# Patient Record
Sex: Male | Born: 1935 | Race: Black or African American | Hispanic: No | Marital: Married | Smoking: Former smoker
Health system: Southern US, Community
[De-identification: ages and names within clinical notes are randomized; demographics above are authoritative.]

## PROBLEM LIST (undated history)

## (undated) DIAGNOSIS — F0280 Dementia in other diseases classified elsewhere without behavioral disturbance: Secondary | ICD-10-CM

## (undated) DIAGNOSIS — E785 Hyperlipidemia, unspecified: Secondary | ICD-10-CM

## (undated) DIAGNOSIS — N400 Enlarged prostate without lower urinary tract symptoms: Secondary | ICD-10-CM

## (undated) DIAGNOSIS — F0390 Unspecified dementia without behavioral disturbance: Secondary | ICD-10-CM

## (undated) DIAGNOSIS — G309 Alzheimer's disease, unspecified: Secondary | ICD-10-CM

## (undated) DIAGNOSIS — I10 Essential (primary) hypertension: Secondary | ICD-10-CM

---

## 1999-09-27 ENCOUNTER — Ambulatory Visit (HOSPITAL_COMMUNITY): Admission: RE | Admit: 1999-09-27 | Discharge: 1999-09-27 | Payer: Self-pay | Admitting: Gastroenterology

## 1999-09-27 ENCOUNTER — Encounter (INDEPENDENT_AMBULATORY_CARE_PROVIDER_SITE_OTHER): Payer: Self-pay | Admitting: Specialist

## 2002-09-16 ENCOUNTER — Encounter (INDEPENDENT_AMBULATORY_CARE_PROVIDER_SITE_OTHER): Payer: Self-pay | Admitting: Specialist

## 2002-09-16 ENCOUNTER — Ambulatory Visit (HOSPITAL_COMMUNITY): Admission: RE | Admit: 2002-09-16 | Discharge: 2002-09-16 | Payer: Self-pay | Admitting: Gastroenterology

## 2008-02-21 ENCOUNTER — Inpatient Hospital Stay (HOSPITAL_COMMUNITY): Admission: EM | Admit: 2008-02-21 | Discharge: 2008-02-26 | Payer: Self-pay | Admitting: Emergency Medicine

## 2008-02-24 ENCOUNTER — Ambulatory Visit: Payer: Self-pay | Admitting: Physical Medicine & Rehabilitation
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
## 2010-12-04 NOTE — Consult Note (Signed)
NAMERAFEAL, SKIBICKI         ACCOUNT NO.:  [id number]   MEDICAL RECORD NO.:  [id number]          PATIENT TYPE:  INP   LOCATION:  5031                         FACILITY:  [facility]   PHYSICIAN:  Corinna L. Lendell Caprice, MDDATE OF BIRTH:  July 29, 1931   DATE OF CONSULTATION:  02/23/2008  DATE OF DISCHARGE:                                 CONSULTATION   REQUESTING PHYSICIAN:  Kerrin Champagne, M.D.   REASON FOR CONSULTATION:  Confusion.   IMPRESSIONS AND RECOMMENDATIONS:  1. Acute delirium, resolved, suspect medication/anesthesia-related:  I      agree with stopping Darvocet.  I will also recommend discontinuing      morphine.  He complains of urinary frequency and has a mild      leukocytosis.  He is afebrile, but I will check an urinalysis to      rule out urinary tract infection as a potential cause of delirium.      Certainly the patient is lucid, cooperative and calm at this time.  2. Hypertension, controlled.  3. Status post fall with resulting right nondisplaced femoral neck      fracture, status post pinning.   HISTORY OF PRESENT ILLNESS:  Mr. Perlmutter is a pleasant 77 year old  black male patient of Dr. Donette Larry who was admitted to Dr. Barbaraann Faster service  with a femoral neck fracture.  This was repaired on August 2.  The  patient apparently had problems with confusion.  Postoperatively he  thought he was at home.  He pulled out his IV and became agitated and  verbally abusive.  Security had to be called.  We were consulted to  assist with his confusion.  Currently the patient is without complaints.  He does not recall the events of yesterday and the day before.  He does  remember tripping as he got out of his car.   PAST MEDICAL HISTORY:  Hypertension.   MEDICATIONS:  Morphine as needed, Colace 100 mg p.o. b.i.d., nifedipine  XL 30 mg a day, warfarin per pharmacy for DVT prophylaxis, morphine 1 mg  q. 1 hour p.r.n.  Darvocet and Ativan were just stopped, as was Robaxin.  He was  started on tramadol as needed.   SOCIAL HISTORY:  The patient reports occasional alcohol use.  He denies  daily use or excessive use.  He quit smoking several years ago.  He is  married.   FAMILY HISTORY:  Noncontributory.   REVIEW OF SYSTEMS:  As above, otherwise negative.   PHYSICAL EXAMINATION:  His temperature is 98.1, pulse is 107,  respiratory rate 18, blood pressure 133, oxygen saturation 96% on room  air.  GENERAL:  The patient is an elderly black male in no acute distress  watching TV.  HEENT:  Normocephalic, atraumatic.  Pupils equal, round, reactive to  light.  Sclerae nonicteric.  Moist mucous membranes.  NECK:  Supple.  No carotid bruits.  No JVD.  No thyromegaly.  LUNGS:  Clear to auscultation bilaterally without wheezes, rhonchi or  rales.  CARDIOVASCULAR:  Regular rate and rhythm, without murmurs, gallops or  rubs.  ABDOMEN:  Normal bowel sounds, soft, nontender, nondistended.  GU AND  RECTAL:  Deferred.  EXTREMITIES:  No clubbing, cyanosis or edema.  He has a clean, dry and  intact dressing over his right hip.  SKIN:  No rash.  PSYCHIATRIC:  The patient is calm and cooperative with a normal affect.  NEUROLOGIC:  The patient is alert and oriented x3.  Cranial nerves and  sensorimotor exam are intact.   LABS:  CBC significant for a white blood cell count of 12,000, otherwise  unremarkable.  INR today on Coumadin is 1.5.  Complete metabolic panel  significant for a total bilirubin of 1.5, AST of 67, otherwise  unremarkable.  EKG shows normal sinus rhythm with left atrial  enlargement.  Right hip film on admission showed acute right femoral  neck fracture with osteopenia.  Chest x-ray shows slightly low lung  volumes, mild tortuosity.  No infiltrate.   [facility] to follow up the urinalysis and for further  delirium.  Thank you, Dr. Otelia Sergeant for this consultation.      Corinna L. Lendell Caprice, MD  Electronically Signed     CLS/MEDQ  D:   02/23/2008  T:  02/23/2008  Job:  [id number]   cc:   Kerrin Champagne, M.D.  [name], MD

## 2010-12-07 NOTE — Op Note (Signed)
   NAMEKENNEY, GOING                     ACCOUNT NO.:  [id number]   MEDICAL RECORD NO.:  [id number]                   PATIENT TYPE:  AMB   LOCATION:  ENDO                                 FACILITY:  [facility]   PHYSICIAN:  Danise Edge, M.D.                DATE OF BIRTH:  06/05/1932   DATE OF PROCEDURE:  09/16/2002  DATE OF DISCHARGE:                                 OPERATIVE REPORT   PROCEDURE:  Colonoscopy and polypectomy.   INDICATIONS:  The patient is a 77 year old male born 2032-01-25.  Three years  ago the patient underwent a colonoscopy and neoplastic polyps were removed  from his colon.  He is due for a repeat surveillance colonoscopy with  polypectomy to prevent colon cancer.   ENDOSCOPIST:  Danise Edge, M.D.   PREMEDICATION:  Versed 10 mg, Demerol 50 mg.   ENDOSCOPE:  Olympus pediatric colonoscope.   DESCRIPTION OF PROCEDURE:  After obtaining informed consent, the patient was  placed in the left lateral decubitus position.  I administered intravenous  Demerol and intravenous Versed to achieve conscious sedation for the  procedure.  The patient's blood pressure, oxygen saturation, and cardiac  rhythm were monitored throughout the procedure and documented in the medical  record.   Anal inspection was normal.  Digital rectal exam was normal.  The Olympus  pediatric video colonoscope was introduced into the rectum and advanced to  the cecum.  Colonic preparation for the exam today was excellent.   The patient does have universal colonic diverticulosis without signs of  diverticulitis or diverticular stricture formation.   Rectum normal.   Sigmoid colon and descending colon normal.   Splenic flexure normal.   Transverse colon normal.   Hepatic flexure:  A 3 mm sessile polyp was removed with the electrocautery  snare.  A 1 mm sessile polyp was removed with the hot biopsy forceps.   Ascending colon normal.   Cecum and ileocecal valve normal.    ASSESSMENT:  1. Universal colonic diverticulosis.  2. From the hepatic flexure a 3 mm polyp and a 1 mm polyp were removed.   RECOMMENDATIONS:  Repeat colonoscopy in five years.                                               Danise Edge, M.D.    MJ/MEDQ  D:  09/16/2002  T:  09/16/2002  Job:  [id number]   cc:   [name], M.D.  [address]  [place]  [place] [address]  Fax: [phone]
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
## 2011-04-19 LAB — HEMOGLOBIN AND HEMATOCRIT, BLOOD
HCT: 43.3
Hemoglobin: 14.1

## 2011-04-19 LAB — PROTIME-INR
INR: 0.9
INR: 1
INR: 1.5
Prothrombin Time: 18.5 — ABNORMAL HIGH
Prothrombin Time: 19.8 — ABNORMAL HIGH
Prothrombin Time: 20.2 — ABNORMAL HIGH

## 2011-04-19 LAB — URINALYSIS, ROUTINE W REFLEX MICROSCOPIC
Hgb urine dipstick: NEGATIVE
Leukocytes, UA: NEGATIVE
Nitrite: NEGATIVE
Protein, ur: 30 — AB
Specific Gravity, Urine: 1.026
Urobilinogen, UA: 1

## 2011-04-19 LAB — COMPREHENSIVE METABOLIC PANEL
ALT: 31
Calcium: 9.3
Creatinine, Ser: 0.91
GFR calc Af Amer: >60
GFR calc non Af Amer: >60
Glucose, Bld: 119 — ABNORMAL HIGH
Sodium: 137
Total Protein: 6.5

## 2011-04-19 LAB — CBC
Hemoglobin: 15.1
Hemoglobin: 15.3
MCHC: 32.1
MCV: 90.6
RBC: 5.25
RDW: 15.1
WBC: 8.3

## 2011-04-19 LAB — DIFFERENTIAL
Lymphocytes Relative: 11 — ABNORMAL LOW
Monocytes Absolute: 0.2
Monocytes Relative: 2 — ABNORMAL LOW
Neutro Abs: 7.1

## 2011-04-19 LAB — URINE MICROSCOPIC-ADD ON

## 2011-04-19 LAB — BASIC METABOLIC PANEL
CO2: 23
Calcium: 9.5
GFR calc Af Amer: >60
GFR calc non Af Amer: >60
Sodium: 137

## 2011-12-18 ENCOUNTER — Other Ambulatory Visit: Payer: Self-pay | Admitting: Internal Medicine

## 2011-12-18 ENCOUNTER — Ambulatory Visit
Admission: RE | Admit: 2011-12-18 | Discharge: 2011-12-18 | Disposition: A | Discharge status: Home / Self Care | Payer: Medicare Other | Source: Ambulatory Visit | Attending: Internal Medicine | Admitting: Internal Medicine

## 2011-12-18 DIAGNOSIS — M79669 Pain in unspecified lower leg: Secondary | ICD-10-CM

## 2014-11-21 ENCOUNTER — Inpatient Hospital Stay (HOSPITAL_COMMUNITY): Payer: Medicare Other

## 2014-11-21 ENCOUNTER — Encounter (HOSPITAL_COMMUNITY): Payer: Self-pay | Admitting: Emergency Medicine

## 2014-11-21 ENCOUNTER — Inpatient Hospital Stay (HOSPITAL_COMMUNITY)
Admission: EM | Admit: 2014-11-21 | Discharge: 2014-11-25 | DRG: 377 | Disposition: A | Discharge status: Home / Self Care | Payer: Medicare Other | Attending: Family Medicine | Admitting: Family Medicine

## 2014-11-21 DIAGNOSIS — K5791 Diverticulosis of intestine, part unspecified, without perforation or abscess with bleeding: Principal | ICD-10-CM | POA: Diagnosis present

## 2014-11-21 DIAGNOSIS — Z87891 Personal history of nicotine dependence: Secondary | ICD-10-CM

## 2014-11-21 DIAGNOSIS — E43 Unspecified severe protein-calorie malnutrition: Secondary | ICD-10-CM | POA: Diagnosis not present

## 2014-11-21 DIAGNOSIS — D72829 Elevated white blood cell count, unspecified: Secondary | ICD-10-CM | POA: Diagnosis present

## 2014-11-21 DIAGNOSIS — G309 Alzheimer's disease, unspecified: Secondary | ICD-10-CM | POA: Diagnosis present

## 2014-11-21 DIAGNOSIS — F0280 Dementia in other diseases classified elsewhere without behavioral disturbance: Secondary | ICD-10-CM | POA: Diagnosis present

## 2014-11-21 DIAGNOSIS — K625 Hemorrhage of anus and rectum: Secondary | ICD-10-CM

## 2014-11-21 DIAGNOSIS — K922 Gastrointestinal hemorrhage, unspecified: Secondary | ICD-10-CM | POA: Diagnosis not present

## 2014-11-21 DIAGNOSIS — Z681 Body mass index (BMI) 19 or less, adult: Secondary | ICD-10-CM

## 2014-11-21 DIAGNOSIS — N400 Enlarged prostate without lower urinary tract symptoms: Secondary | ICD-10-CM

## 2014-11-21 DIAGNOSIS — I10 Essential (primary) hypertension: Secondary | ICD-10-CM | POA: Diagnosis present

## 2014-11-21 DIAGNOSIS — K5790 Diverticulosis of intestine, part unspecified, without perforation or abscess without bleeding: Secondary | ICD-10-CM | POA: Diagnosis present

## 2014-11-21 DIAGNOSIS — E785 Hyperlipidemia, unspecified: Secondary | ICD-10-CM | POA: Diagnosis present

## 2014-11-21 DIAGNOSIS — F0391 Unspecified dementia with behavioral disturbance: Secondary | ICD-10-CM | POA: Diagnosis not present

## 2014-11-21 DIAGNOSIS — D62 Acute posthemorrhagic anemia: Secondary | ICD-10-CM

## 2014-11-21 DIAGNOSIS — F0390 Unspecified dementia without behavioral disturbance: Secondary | ICD-10-CM | POA: Diagnosis not present

## 2014-11-21 DIAGNOSIS — R197 Diarrhea, unspecified: Secondary | ICD-10-CM | POA: Diagnosis not present

## 2014-11-21 DIAGNOSIS — K5731 Diverticulosis of large intestine without perforation or abscess with bleeding: Secondary | ICD-10-CM | POA: Diagnosis not present

## 2014-11-21 DIAGNOSIS — D649 Anemia, unspecified: Secondary | ICD-10-CM

## 2014-11-21 HISTORY — DX: Dementia in other diseases classified elsewhere, unspecified severity, without behavioral disturbance, psychotic disturbance, mood disturbance, and anxiety: F02.80

## 2014-11-21 HISTORY — DX: Benign prostatic hyperplasia without lower urinary tract symptoms: N40.0

## 2014-11-21 HISTORY — DX: Unspecified dementia without behavioral disturbance: F03.90

## 2014-11-21 HISTORY — DX: Alzheimer's disease, unspecified: G30.9

## 2014-11-21 HISTORY — DX: Essential (primary) hypertension: I10

## 2014-11-21 HISTORY — DX: Unspecified dementia, unspecified severity, without behavioral disturbance, psychotic disturbance, mood disturbance, and anxiety: F03.90

## 2014-11-21 HISTORY — DX: Hyperlipidemia, unspecified: E78.5

## 2014-11-21 LAB — CBC
HCT: 40.4 % (ref 39.0–52.0)
HCT: 38.1 % — ABNORMAL LOW (ref 39.0–52.0)
HEMOGLOBIN: 12.8 g/dL — AB (ref 13.0–17.0)
Hemoglobin: 12.4 g/dL — ABNORMAL LOW (ref 13.0–17.0)
MCH: 28.1 pg (ref 26.0–34.0)
MCH: 28.6 pg (ref 26.0–34.0)
MCHC: 31.7 g/dL (ref 30.0–36.0)
MCHC: 32.5 g/dL (ref 30.0–36.0)
MCV: 88.8 fL (ref 78.0–100.0)
MCV: 88 fL (ref 78.0–100.0)
PLATELETS: 485 10*3/uL — AB (ref 150–400)
PLATELETS: 462 10*3/uL — AB (ref 150–400)
RBC: 4.55 MIL/uL (ref 4.22–5.81)
RBC: 4.33 MIL/uL (ref 4.22–5.81)
RDW: 14.2 % (ref 11.5–15.5)
RDW: 14.1 % (ref 11.5–15.5)
WBC: 11.4 10*3/uL — ABNORMAL HIGH (ref 4.0–10.5)
WBC: 8.9 10*3/uL (ref 4.0–10.5)

## 2014-11-21 LAB — URINALYSIS, ROUTINE W REFLEX MICROSCOPIC
Bilirubin Urine: NEGATIVE
GLUCOSE, UA: NEGATIVE mg/dL
Hgb urine dipstick: NEGATIVE
KETONES UR: 40 mg/dL — AB
Leukocytes, UA: NEGATIVE
NITRITE: NEGATIVE
Protein, ur: NEGATIVE mg/dL
Specific Gravity, Urine: 1.015 (ref 1.005–1.030)
UROBILINOGEN UA: 0.2 mg/dL (ref 0.0–1.0)
pH: 6 (ref 5.0–8.0)

## 2014-11-21 LAB — COMPREHENSIVE METABOLIC PANEL
ALBUMIN: 3.4 g/dL — AB (ref 3.5–5.0)
ALK PHOS: 59 U/L (ref 38–126)
ALT: 11 U/L — AB (ref 17–63)
ANION GAP: 6 (ref 5–15)
AST: 19 U/L (ref 15–41)
BUN: 9 mg/dL (ref 6–20)
CALCIUM: 8.7 mg/dL — AB (ref 8.9–10.3)
CO2: 26 mmol/L (ref 22–32)
Chloride: 107 mmol/L (ref 101–111)
Creatinine, Ser: 0.97 mg/dL (ref 0.61–1.24)
GFR calc Af Amer: >60 mL/min (ref >60)
GFR calc non Af Amer: >60 mL/min (ref >60)
GLUCOSE: 89 mg/dL (ref 70–99)
Potassium: 3.8 mmol/L (ref 3.5–5.1)
Sodium: 139 mmol/L (ref 135–145)
TOTAL PROTEIN: 7.6 g/dL (ref 6.5–8.1)
Total Bilirubin: 0.7 mg/dL (ref 0.3–1.2)

## 2014-11-21 LAB — POC OCCULT BLOOD, ED: Fecal Occult Bld: POSITIVE — AB

## 2014-11-21 LAB — MAGNESIUM: Magnesium: 2.1 mg/dL (ref 1.7–2.4)

## 2014-11-21 LAB — LIPASE, BLOOD: Lipase: 22 U/L (ref 22–51)

## 2014-11-21 MED ORDER — ONDANSETRON HCL 4 MG PO TABS: 4.0000 mg | ORAL_TABLET | Freq: Four times a day (QID) | ORAL | Status: DC | PRN

## 2014-11-21 MED ORDER — ONDANSETRON HCL 4 MG/2ML IJ SOLN: 4.0000 mg | Freq: Four times a day (QID) | INTRAMUSCULAR | Status: DC | PRN

## 2014-11-21 MED ORDER — DONEPEZIL HCL 5 MG PO TABS
5.0000 mg | ORAL_TABLET | Freq: Every day | ORAL | Status: DC
  Administered 2014-11-21 – 2014-11-24 (×3): 5 mg via ORAL
  Filled 2014-11-21 (×3): qty 1

## 2014-11-21 MED ORDER — MAGNESIUM CITRATE PO SOLN: 1.0000 | Freq: Once | ORAL | Status: AC | PRN

## 2014-11-21 MED ORDER — ACETAMINOPHEN 650 MG RE SUPP: 650.0000 mg | Freq: Four times a day (QID) | RECTAL | Status: DC | PRN

## 2014-11-21 MED ORDER — SODIUM CHLORIDE 0.9 % IV SOLN
INTRAVENOUS | Status: DC
  Administered 2014-11-21 – 2014-11-23 (×2): via INTRAVENOUS

## 2014-11-21 MED ORDER — PANTOPRAZOLE SODIUM 40 MG IV SOLR
40.0000 mg | Freq: Two times a day (BID) | INTRAVENOUS | Status: DC
  Administered 2014-11-21 – 2014-11-24 (×5): 40 mg via INTRAVENOUS
  Filled 2014-11-21 (×7): qty 40

## 2014-11-21 MED ORDER — FINASTERIDE 5 MG PO TABS
5.0000 mg | ORAL_TABLET | Freq: Every day | ORAL | Status: DC
Start: 2014-11-22 — End: 2014-11-25
  Administered 2014-11-22 – 2014-11-25 (×4): 5 mg via ORAL
  Filled 2014-11-21 (×4): qty 1

## 2014-11-21 MED ORDER — SODIUM CHLORIDE 0.9 % IV BOLUS (SEPSIS): 1000.0000 mL | Freq: Once | INTRAVENOUS | Status: DC

## 2014-11-21 MED ORDER — SORBITOL 70 % SOLN: 30.0000 mL | Freq: Every day | Status: DC | PRN

## 2014-11-21 MED ORDER — SODIUM CHLORIDE 0.9 % IV SOLN
INTRAVENOUS | Status: AC
  Administered 2014-11-21: 16:00:00 via INTRAVENOUS

## 2014-11-21 MED ORDER — SENNOSIDES-DOCUSATE SODIUM 8.6-50 MG PO TABS: 1.0000 | ORAL_TABLET | Freq: Every evening | ORAL | Status: DC | PRN

## 2014-11-21 MED ORDER — MIRTAZAPINE 15 MG PO TABS
7.5000 mg | ORAL_TABLET | Freq: Every day | ORAL | Status: DC
  Administered 2014-11-21 – 2014-11-24 (×3): 7.5 mg via ORAL
  Filled 2014-11-21 (×3): qty 1

## 2014-11-21 MED ORDER — ACETAMINOPHEN 325 MG PO TABS
650.0000 mg | ORAL_TABLET | Freq: Four times a day (QID) | ORAL | Status: DC | PRN
  Administered 2014-11-24: 650 mg via ORAL
  Filled 2014-11-21: qty 2

## 2014-11-21 MED ORDER — ALUM & MAG HYDROXIDE-SIMETH 200-200-20 MG/5ML PO SUSP: 30.0000 mL | Freq: Four times a day (QID) | ORAL | Status: DC | PRN

## 2014-11-21 NOTE — H&P (Signed)
[facility] History and Physical  Billy [name] Oncale FAO:[id number] DOB: Nov 10, 1931 DOA: 11/21/2014  Referring physician: Rosetta Posnert Steinl PCP: Georgann HousekeeperHUSAIN,KARRAR, MD   Chief Complaint: Rectal bleeding  HPI: Billy [name] Narciso is a 81 y.o. male  With history of hypertension, hyperlipidemia, BPH, dementia, pandiverticulosis who presented to the ED with a 2 to three-day history of worsening bright red blood per rectum. History is obtained from caretaker and patient. Caretaker and Patient does state that has had some loose stools which have been black in color and subsequently turned bright red with significant worsening bleeding on the day of admission. Patient denies any fever, no chills, no nausea, no dysuria, no hematemesis, no shortness of breath, no chest pain. Patient does endorse some dizziness and generalized weakness as well as emesis that he describes as dark colored. Patient does endorse bilateral lower abdominal pain of been ongoing for the past 1-2 days. Patient denies any insect use states he uses Tylenol as needed. Patient denies any history of peptic ulcer disease. Patient denies any history of hemorrhoids. Patient was seen in the emergency room labs obtained had a comprehensive metabolic profile that was unremarkable. CBC with a white count of 11.4 hemoglobin of 12.8 platelet count of 485 otherwise was unremarkable. Urinalysis was nitrite negative and leukocyte negative. FOBT was positive. [facility] were called to admit the patient for further evaluation and management. EDP was also to contact GI for further evaluation and consultation.   Review of Systems: As per history of present illness otherwise negative. Constitutional:  No weight loss, night sweats, Fevers, chills, fatigue.  HEENT:  No headaches, Difficulty swallowing,Tooth/dental problems,Sore throat,  No sneezing, itching, ear ache, nasal congestion, post nasal drip,  Cardio-vascular:  No chest pain, Orthopnea, PND,  swelling in lower extremities, anasarca, dizziness, palpitations  GI:  No heartburn, indigestion, abdominal pain, nausea, vomiting, diarrhea, change in bowel habits, loss of appetite  Resp:  No shortness of breath with exertion or at rest. No excess mucus, no productive cough, No non-productive cough, No coughing up of blood.No change in color of mucus.No wheezing.No chest wall deformity  Skin:  no rash or lesions.  GU:  no dysuria, change in color of urine, no urgency or frequency. No flank pain.  Musculoskeletal:  No joint pain or swelling. No decreased range of motion. No back pain.  Psych:  No change in mood or affect. No depression or anxiety. No memory loss.   Past Medical History  Diagnosis Date  . Hypertension   . Hyperlipidemia   . Alzheimer's disease   . HTN (hypertension) 11/21/2014  . Dementia 11/21/2014  . BPH (benign prostatic hyperplasia) 11/21/2014   History reviewed. No pertinent past surgical history. Social History:  reports that he has quit smoking. He does not have any smokeless tobacco history on file. He reports that he does not drink alcohol or use illicit drugs.  No Known Allergies  History reviewed. No pertinent family history. Patient states both mother and father deceased patient cannot remember what the deceased from.  Prior to Admission medications   Medication Sig Start Date End Date Taking? Authorizing Provider  donepezil (ARICEPT) 5 MG tablet Take 1 tablet by mouth at bedtime. 10/25/14  Yes Historical Provider, MD  finasteride (PROSCAR) 5 MG tablet Take 1 tablet by mouth daily. 10/25/14  Yes Historical Provider, MD  mirtazapine (REMERON) 7.5 MG tablet Take 1 tablet by mouth at bedtime. 11/02/14  Yes Historical Provider, MD  NIFEdipine (PROCARDIA-XL/ADALAT-CC/NIFEDICAL-XL) 30 MG 24 hr tablet Take 30 mg by  mouth daily. 10/25/14  Yes Historical Provider, MD   Physical Exam: Filed Vitals:   11/21/14 1252 11/21/14 1550  BP: 147/84 136/61  Pulse: 90 105  Temp:  97.9 F (36.6 C)   TempSrc: Oral   Resp: 18 18  Height:  (1.727 m)   Weight: 68.04 kg (150 lb)   SpO2: 98% 100%    Wt Readings from Last 3 Encounters:  11/21/14 68.04 kg (150 lb)    General:  Elderly thin gentleman in no acute cardiopulmonary distress. Speaking in full sentences. Appears calm and comfortable Eyes: PERRLA, EOMI, normal lids, irises & conjunctiva ENT: grossly normal hearing, lips & tongue Neck: no LAD, masses or thyromegaly Cardiovascular: Tachycardia, no m/r/g. No LE edema. Respiratory: CTA bilaterally, no w/r/r. Normal respiratory effort. Abdomen: soft, nondistended, positive bowel sounds, some tenderness to palpation in bilateral lower quadrants. Skin: no rash or induration seen on limited exam Musculoskeletal: grossly normal tone BUE/BLE Psychiatric: grossly normal mood and affect, speech fluent and appropriate Neurologic: Alert and oriented 3. Cranial nerves II through XII are grossly intact. No focal deficits.           Labs on Admission:  Basic Metabolic Panel:  Recent Labs Lab 11/21/14 1419  NA 139  K 3.8  CL 107  CO2 26  GLUCOSE 89  BUN 9  CREATININE 0.97  CALCIUM 8.7*   Liver Function Tests:  Recent Labs Lab 11/21/14 1419  AST 19  ALT 11*  ALKPHOS 59  BILITOT 0.7  PROT 7.6  ALBUMIN 3.4*    Recent Labs Lab 11/21/14 1419  LIPASE 22   No results for input(s): AMMONIA in the last 168 hours. CBC:  Recent Labs Lab 11/21/14 1419  WBC 11.4*  HGB 12.8*  HCT 40.4  MCV 88.8  PLT 485*   Cardiac Enzymes: No results for input(s): CKTOTAL, CKMB, CKMBINDEX, TROPONINI in the last 168 hours.  BNP (last 3 results) No results for input(s): BNP in the last 8760 hours.  ProBNP (last 3 results) No results for input(s): PROBNP in the last 8760 hours.  CBG: No results for input(s): GLUCAP in the last 168 hours.  Radiological Exams on Admission: No results found.  EKG: Not done  Assessment/Plan Principal Problem:   GI  bleed Active Problems:   GI bleeding   Acute blood loss anemia   Diverticulosis   Leukocytosis   HTN (hypertension)   Dementia   BPH (benign prostatic hyperplasia)  #1 GI bleed Patient is presenting with a 2 to three-day history of worsening bleeding that he describes mostly as a bright red blood per rectum. Patient and caretaker however states that prior to his bright red blood bleeding patient was noted to have loose stools that were described as black and concerning for melena. Patient denies any NSAID use. Patient however does have a history of diverticulosis. Patient's BUN is within normal limits. Will admit the patient to telemetry. Place patient on clear liquids. Place patient on a PPI IV every 12 hours. Will check serial CBCs. Follow H&H. IV fluids. Supportive care. GI will be consulted on the patient. Follow for now.  #2 acute blood loss anemia Secondary to problem #1. Check serial CBCs. Transfusion threshold hemoglobin less than 8.  #3 history of diverticulosis Follow for now.  #4 leukocytosis Likely reactive leukocytosis. Urinalysis is negative. Will check a chest x-ray. Follow.  #5 dementia Continue Aricept.  #6 hypertension WIll hold blood pressure medications for now.  Follow.  #7 BPH Continue home regimen  of Proscar.  #8 prophylaxis PPI for GI prophylaxis. SCDs for DVT prophylaxis.  Code Status: Full DVT Prophylaxis: SCDs Family Communication: Updated patient, caretaker, niece at bedside. Disposition Plan: Admit to telemetry.  Time spent: 70 mins  [facility] MD [facility] Pager [phone]

## 2014-11-21 NOTE — ED Notes (Signed)
Per EMS-patient presents from home and is c/o blood (bright red blood) noted in stool and with stool, diarrhea x3 days, denies emesis but endorses nausea. Caregiver reports some dementia and says, "he wasn't acting himself" but could not articulate why or how. A&Ox4 for EMS.

## 2014-11-21 NOTE — ED Notes (Signed)
Billy HarmanDana charge , pt can go at 15;05.Jeralene [email or web address]

## 2014-11-21 NOTE — ED Notes (Signed)
I attempted to collect labs on patient and wad unsuccessful.  I made nurse awrae.

## 2014-11-21 NOTE — ED Notes (Signed)
Unable to obtain blood for testing

## 2014-11-21 NOTE — ED Notes (Signed)
MD hospitalist at bedside.

## 2014-11-21 NOTE — ED Notes (Addendum)
POS Blood Occult reported to RN [employer] Melton and Dr Denton LankSteinl

## 2014-11-21 NOTE — Consult Note (Signed)
[facility] CONSULT Reason for consult: G.I. bleeding Referring Physician: ER. PCP: Dr. Lysle Rubens. Primary G.I.: Billy [name] is an 81 y.o. male.  HPI: he has dementia and lives at home. He is widowed and has an active niece and has had help in the home taking care. He is seen Dr. Wynetta Emery for many years with multiple prior colonoscopies. Colonoscopy in 2004 and again in 2010 revealed pan diverticulosis. Nation had adenomatous polyp removed in 2010 another polyp removed in 2004 but I'm currently unable to find a path on that polyp. According to his caretaker and to his niece he's been eating well, having no abdominal pain, and is not been constipated. He had some loose stools and cramps yesterday in a small amount of blood with a larger amount of blood today. He denied any abdominal pain today. He's had no nausea vomiting. He is on Depokote and nifedipine and home, but is not apparently on NSAIDs or PPI therapy. He also takes Remeron. He had a small amount of readily stool in the emergency room heme positive. Hemoglobin is 12.8  Past Medical History  Diagnosis Date  . Hypertension   . Hyperlipidemia     History reviewed. No pertinent past surgical history.  History reviewed. No pertinent family history.  Social History:  reports that he has quit smoking. He does not have any smokeless tobacco history on file. He reports that he does not drink alcohol. His drug history is not on file.  Allergies: No Known Allergies  Medications; Prior to Admission medications   Medication Sig Start Date End Date Taking? Authorizing Provider  donepezil (ARICEPT) 5 MG tablet Take 1 tablet by mouth at bedtime. 10/25/14  Yes Historical Provider, MD  finasteride (PROSCAR) 5 MG tablet Take 1 tablet by mouth daily. 10/25/14  Yes Historical Provider, MD  mirtazapine (REMERON) 7.5 MG tablet Take 1 tablet by mouth at bedtime. 11/02/14  Yes Historical Provider, MD  NIFEdipine  (PROCARDIA-XL/ADALAT-CC/NIFEDICAL-XL) 30 MG 24 hr tablet Take 30 mg by mouth daily. 10/25/14  Yes Historical Provider, MD     PRN Meds  Results for orders placed or performed during the hospital encounter of 11/21/14 (from the past 48 hour(s))  CBC     Status: Abnormal   Collection Time: 11/21/14  2:19 PM  Result Value Ref Range   WBC 11.4 (H) 4.0 - 10.5 K/uL   RBC 4.55 4.22 - 5.81 MIL/uL   Hemoglobin 12.8 (L) 13.0 - 17.0 g/dL   HCT 40.4 39.0 - 52.0 %   MCV 88.8 78.0 - 100.0 fL   MCH 28.1 26.0 - 34.0 pg   MCHC 31.7 30.0 - 36.0 g/dL   RDW 14.2 11.5 - 15.5 %   Platelets 485 (H) 150 - 400 K/uL  Comprehensive metabolic panel     Status: Abnormal   Collection Time: 11/21/14  2:19 PM  Result Value Ref Range   Sodium 139 135 - 145 mmol/L   Potassium 3.8 3.5 - 5.1 mmol/L   Chloride 107 101 - 111 mmol/L   CO2 26 22 - 32 mmol/L   Glucose, Bld 89 70 - 99 mg/dL   BUN 9 6 - 20 mg/dL   Creatinine, Ser 0.97 0.61 - 1.24 mg/dL   Calcium 8.7 (L) 8.9 - 10.3 mg/dL   Total Protein 7.6 6.5 - 8.1 g/dL   Albumin 3.4 (L) 3.5 - 5.0 g/dL   AST 19 15 - 41 U/L   ALT 11 (L) 17 - 63 U/L  Alkaline Phosphatase 59 38 - 126 U/L   Total Bilirubin 0.7 0.3 - 1.2 mg/dL   GFR calc non Af Amer >60 >60 mL/min   GFR calc Af Amer >60 >60 mL/min    Comment: (NOTE) The eGFR has been calculated using the CKD EPI equation. This calculation has not been validated in all clinical situations. eGFR's persistently <90 mL/min signify possible Chronic Kidney Disease.    Anion gap 6 5 - 15  Lipase, blood     Status: None   Collection Time: 11/21/14  2:19 PM  Result Value Ref Range   Lipase 22 22 - 51 U/L  POC occult blood, ED RN will collect     Status: Abnormal   Collection Time: 11/21/14  2:29 PM  Result Value Ref Range   Fecal Occult Bld POSITIVE (A) NEGATIVE  Urinalysis, Routine w reflex microscopic     Status: Abnormal   Collection Time: 11/21/14  3:55 PM  Result Value Ref Range   Color, Urine YELLOW YELLOW    APPearance CLEAR CLEAR   Specific Gravity, Urine 1.015 1.005 - 1.030   pH 6.0 5.0 - 8.0   Glucose, UA NEGATIVE NEGATIVE mg/dL   Hgb urine dipstick NEGATIVE NEGATIVE   Bilirubin Urine NEGATIVE NEGATIVE   Ketones, ur 40 (A) NEGATIVE mg/dL   Protein, ur NEGATIVE NEGATIVE mg/dL   Urobilinogen, UA 0.2 0.0 - 1.0 mg/dL   Nitrite NEGATIVE NEGATIVE   Leukocytes, UA NEGATIVE NEGATIVE    Comment: MICROSCOPIC NOT DONE ON URINES WITH NEGATIVE PROTEIN, BLOOD, LEUKOCYTES, NITRITE, OR GLUCOSE <1000 mg/dL.    No results found.             Blood pressure 136/61, pulse 105, temperature 97.9 F (36.6 C), temperature source Oral, resp. rate 18, height _0  (1.727 m), weight 68.04 kg (150 lb), SpO2 100 %.  Physical exam:   General--pleasantly confused African-American male ENT-- nonicteric, mouth is normal with no signs of active bleeding Neck-- normal Heart-- regular rate and rhythm without murmurs are gallops Lungs--clear Abdomen-- soft and nontender Psych-- pleasantly confused   Assessment: 1. G.I. bleeding. Stool is bright red BUN is normal. This is most consistent with a lower G.I. bleed. He is known to have extensive than diverticulosis on previous colonoscopy this is the most logical explanation. 2. Alzheimer's disease 3. Hypertension  Plan: we will follow with you here in the hospital. I would go ahead and place them on a clear liquid diet and follow his hemoglobin. Given his age and the fact that he has known extensive pan diverticulosis I would not recommend colonoscopy as long as he stops bleeding.   Rebecca Cairns JR,Carlon Davidson L 11/21/2014, 5:00 PM   Pager: [phone] If no answer or after hours call [phone]

## 2014-11-21 NOTE — ED Provider Notes (Addendum)
CSN: [id number]     Arrival date & time 11/21/14  1245 History   First MD Initiated Contact with Patient 11/21/14 1409     Chief Complaint  Patient presents with  . GI Bleeding  . Nausea  . Diarrhea    3 days     (Consider location/radiation/quality/duration/timing/severity/associated sxs/prior Treatment) Patient is a 81 y.o. male presenting with diarrhea. The history is provided by the patient.  Diarrhea Associated symptoms: no abdominal pain, no chills, no fever, no headaches and no vomiting   Patient c/o diarrhea for the past couple days, several loose to watery stools. Since last night, has noted a couple episodes of bright red blood per rectum. Denies hx same. Pt denies hx diverticula or polyps. Denies hx gi bleeding. No hx pud.  No abdominal pain or distension. Urinating normal amount, no dysuria or hematuria. No other abn bleeding or bruising. No anticoagulant use. Denies faintness or dizziness. Normal appetite. No rectal trauma. Denies hx hemorrhoids. No rectal pain.       Past Medical History  Diagnosis Date  . Hypertension   . Hyperlipidemia    History reviewed. No pertinent past surgical history. History reviewed. No pertinent family history. History  Substance Use Topics  . Smoking status: Former Games developer  . Smokeless tobacco: Not on file  . Alcohol Use: No    Review of Systems  Constitutional: Negative for fever and chills.  HENT: Negative for nosebleeds and sore throat.   Eyes: Negative for redness.  Respiratory: Negative for shortness of breath.   Cardiovascular: Negative for chest pain.  Gastrointestinal: Positive for diarrhea and blood in stool. Negative for vomiting and abdominal pain.  Endocrine: Negative for polyuria.  Genitourinary: Negative for hematuria and flank pain.  Musculoskeletal: Negative for back pain and neck pain.  Skin: Negative for rash.  Neurological: Negative for syncope, light-headedness and headaches.  Hematological: Does not  bruise/bleed easily.  Psychiatric/Behavioral: Negative for confusion.      Allergies  Review of patient's allergies indicates no known allergies.  Home Medications   Prior to Admission medications   Medication Sig Start Date End Date Taking? Authorizing Provider  donepezil (ARICEPT) 5 MG tablet Take 1 tablet by mouth at bedtime. 10/25/14  Yes Historical Provider, MD  finasteride (PROSCAR) 5 MG tablet Take 1 tablet by mouth daily. 10/25/14  Yes Historical Provider, MD  mirtazapine (REMERON) 7.5 MG tablet Take 1 tablet by mouth at bedtime. 11/02/14  Yes Historical Provider, MD  NIFEdipine (PROCARDIA-XL/ADALAT-CC/NIFEDICAL-XL) 30 MG 24 hr tablet Take 30 mg by mouth daily. 10/25/14  Yes Historical Provider, MD   BP 147/84 mmHg  Pulse 90  Temp(Src) 97.9 F (36.6 C) (Oral)  Resp 18  Ht  (1.727 m)  Wt 150 lb (68.04 kg)  BMI 22.81 kg/m2  SpO2 98% Physical Exam  Constitutional: He is oriented to person, place, and time. He appears well-developed and well-nourished. No distress.  HENT:  Head: Atraumatic.  Eyes: Conjunctivae are normal. No scleral icterus.  Neck: Neck supple. No tracheal deviation present.  Cardiovascular: Normal rate, regular rhythm, normal heart sounds and intact distal pulses.   Pulmonary/Chest: Effort normal and breath sounds normal. No accessory muscle usage. No respiratory distress.  Abdominal: Soft. Bowel sounds are normal. He exhibits no distension and no mass. There is no tenderness. There is no rebound and no guarding.  Genitourinary:  No stool on rectal, scant amount mucous w small streaks brb, tests heme pos.   Musculoskeletal: Normal range of motion. He exhibits  no edema.  Neurological: He is alert and oriented to person, place, and time.  Skin: Skin is warm and dry. He is not diaphoretic. No pallor.  Psychiatric: He has a normal mood and affect.  Nursing note and vitals reviewed.   ED Course  Procedures (including critical care time) Labs  Review  Results for orders placed or performed during the hospital encounter of 11/21/14  CBC  Result Value Ref Range   WBC 11.4 (H) 4.0 - 10.5 K/uL   RBC 4.55 4.22 - 5.81 MIL/uL   Hemoglobin 12.8 (L) 13.0 - 17.0 g/dL   HCT 16.140.4 09.639.0 - 04.552.0 %   MCV 88.8 78.0 - 100.0 fL   MCH 28.1 26.0 - 34.0 pg   MCHC 31.7 30.0 - 36.0 g/dL   RDW 40.914.2 81.111.5 - 91.415.5 %   Platelets 485 (H) 150 - 400 K/uL  Comprehensive metabolic panel  Result Value Ref Range   Sodium 139 135 - 145 mmol/L   Potassium 3.8 3.5 - 5.1 mmol/L   Chloride 107 101 - 111 mmol/L   CO2 26 22 - 32 mmol/L   Glucose, Bld 89 70 - 99 mg/dL   BUN 9 6 - 20 mg/dL   Creatinine, Ser 7.820.97 0.61 - 1.24 mg/dL   Calcium 8.7 (L) 8.9 - 10.3 mg/dL   Total Protein 7.6 6.5 - 8.1 g/dL   Albumin 3.4 (L) 3.5 - 5.0 g/dL   AST 19 15 - 41 U/L   ALT 11 (L) 17 - 63 U/L   Alkaline Phosphatase 59 38 - 126 U/L   Total Bilirubin 0.7 0.3 - 1.2 mg/dL   GFR calc non Af Amer >60 >60 mL/min   GFR calc Af Amer >60 >60 mL/min   Anion gap 6 5 - 15  Lipase, blood  Result Value Ref Range   Lipase 22 22 - 51 U/L  POC occult blood, ED RN will collect  Result Value Ref Range   Fecal Occult Bld POSITIVE (A) NEGATIVE       MDM   Iv ns. Labs.  Reviewed nursing notes and prior charts for additional history.   Given recurrent episodes rectal bleeding, mild drop in hgb from prior (although no recent priors), advanced age, will admit for obs, repeat hgb.  hospitalists paged for admission - requests gi consult.  Discussed with GI, Dr Randa EvensEdwards - he will see in consult.   Temp admit orders to Hospitalist, tele.   Recheck pt, no change from prior, hr 102.     Cathren LaineKevin Kinlie Janice, MD 11/21/14 [id number]

## 2014-11-21 NOTE — ED Notes (Signed)
Bed: GN56WA04 Expected date:  Expected time:  Means of arrival:  Comments: EMS- 81yo M, n/v/d, GI Bleed

## 2014-11-22 LAB — BASIC METABOLIC PANEL
ANION GAP: 5 (ref 5–15)
BUN: 5 mg/dL — ABNORMAL LOW (ref 6–20)
CHLORIDE: 106 mmol/L (ref 101–111)
CO2: 24 mmol/L (ref 22–32)
CREATININE: 0.83 mg/dL (ref 0.61–1.24)
Calcium: 8.4 mg/dL — ABNORMAL LOW (ref 8.9–10.3)
GFR calc non Af Amer: >60 mL/min (ref >60)
Glucose, Bld: 95 mg/dL (ref 70–99)
POTASSIUM: 3.6 mmol/L (ref 3.5–5.1)
SODIUM: 135 mmol/L (ref 135–145)

## 2014-11-22 LAB — CBC
HEMATOCRIT: 39.8 % (ref 39.0–52.0)
HEMATOCRIT: 40.3 % (ref 39.0–52.0)
HEMOGLOBIN: 12.9 g/dL — AB (ref 13.0–17.0)
Hemoglobin: 12.6 g/dL — ABNORMAL LOW (ref 13.0–17.0)
MCH: 28.5 pg (ref 26.0–34.0)
MCH: 27.8 pg (ref 26.0–34.0)
MCHC: 32.4 g/dL (ref 30.0–36.0)
MCHC: 31.3 g/dL (ref 30.0–36.0)
MCV: 88.1 fL (ref 78.0–100.0)
MCV: 88.8 fL (ref 78.0–100.0)
PLATELETS: 498 10*3/uL — AB (ref 150–400)
Platelets: 507 10*3/uL — ABNORMAL HIGH (ref 150–400)
RBC: 4.52 MIL/uL (ref 4.22–5.81)
RBC: 4.54 MIL/uL (ref 4.22–5.81)
RDW: 14.2 % (ref 11.5–15.5)
RDW: 14.1 % (ref 11.5–15.5)
WBC: 8.7 10*3/uL (ref 4.0–10.5)
WBC: 9.8 10*3/uL (ref 4.0–10.5)

## 2014-11-22 MED ORDER — POLYETHYLENE GLYCOL 3350 17 G PO PACK
17.0000 g | PACK | Freq: Three times a day (TID) | ORAL | Status: DC
  Administered 2014-11-22 – 2014-11-24 (×3): 17 g via ORAL
  Filled 2014-11-22 (×3): qty 1

## 2014-11-22 MED ORDER — NIFEDIPINE ER 30 MG PO TB24
30.0000 mg | ORAL_TABLET | Freq: Every day | ORAL | Status: DC
  Administered 2014-11-22 – 2014-11-25 (×4): 30 mg via ORAL
  Filled 2014-11-22 (×5): qty 1

## 2014-11-22 MED ORDER — LORAZEPAM 1 MG PO TABS
1.0000 mg | ORAL_TABLET | Freq: Two times a day (BID) | ORAL | Status: DC | PRN
  Administered 2014-11-22 – 2014-11-24 (×3): 1 mg via ORAL
  Filled 2014-11-22 (×3): qty 1

## 2014-11-22 MED ORDER — HALOPERIDOL LACTATE 5 MG/ML IJ SOLN
2.5000 mg | Freq: Once | INTRAMUSCULAR | Status: AC
  Administered 2014-11-22: 2.5 mg via INTRAMUSCULAR
  Filled 2014-11-22: qty 1
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
## 2014-11-22 NOTE — Progress Notes (Signed)
Patient ID: Billy KiefSylvester Frick, male   DOB: [birth year], 81 y.o.   MRN: [id number]  [facility] PROGRESS NOTE  Billy KiefSylvester Denson WGN:[id number] DOB: [birth year] DOA: 11/21/2014 PCP: Georgann HousekeeperHUSAIN,KARRAR, MD   Brief narrative:   81 y.o. male with HTN, HLD, BPH, dementia, [facility] with main concern of several days duration of persistent BRBPR with no pain. Pt was not able to provide much history at the time of the admission due to dementia and caretaker at bedside reported this to be new problem with no similar events in the past. Pt has denied chest pain or shortness of breath, no specific urinary or abdominal concern but has noted occasional abd cramping.  In ED, pt noted to be hemodynamically stable, VSS, blood work with Hg 12.8. FOBT positive, [facility] asked to admit for further evaluation. GI team consulted.  Assessment/Plan:    Principal Problem:   Acute blood loss anemia - secondary to lower GI bleed, likely diverticular  - Hg remains stable for now but pt with persistent BRBPR - no indication for an intervention at this time and no indication for transfusion - continue to follow up on GI team recommendations  - continue PPI Active Problems:   HTN (hypertension) - BP remains at target range  - continue to monitor per floor protocol  - can resume Nifedipine as per home regimen    Dementia - at baseline - PT evaluation requested    BPH (benign prostatic hyperplasia) - good urine output    Non severe PCM - with poor intake, nutritionist consulted   DVT prophylaxis - SCD's  Code Status: Full.  Family Communication:  plan of care discussed with the patient and wife at bedside  Disposition Plan: Home when GI team clears for d/c, if Hg remains stable and non indication for an intervention.   IV access:  Peripheral IV  Procedures and diagnostic studies:    X-ray Chest Pa And Lateral  11/21/2014   No acute disease.     Medical Consultants:  GI  Other Consultants:   PT Nutritionist   IAnti-Infectives:   None   Debbora PrestoMAGICK-Bret Vanessen, MD  [facility] Pager [phone]  If 7PM-7AM, please contact night-coverage [email or web address] Password [email or web address] 11/22/2014, 4:58 PM   LOS: 1 day   HPI/Subjective: No events overnight. Still with BRBPR  Objective: Filed Vitals:   11/21/14 2130 11/22/14 0508 11/22/14 0515 11/22/14 1543  BP: 145/89 152/90  131/80  Pulse: 100 85  104  Temp: 98.4 F (36.9 C) 98.3 F (36.8 C)  98.6 F (37 C)  TempSrc: Oral Oral  Oral  Resp: 18 18  18   Height:      Weight:   60.147 kg (132 lb 9.6 oz)   SpO2: 100% 100%  100%    Intake/Output Summary (Last 24 hours) at 11/22/14 1658 Last data filed at 11/22/14 0958  Gross per 24 hour  Intake 738.75 ml  Output    285 ml  Net 453.75 ml    Exam:   General:  Pt is alert, follows commands appropriately, not in acute distress  Cardiovascular: Regular rhythm, tachycardic, S1/S2, no murmurs, no rubs, no gallops  Respiratory: Clear to auscultation bilaterally, no wheezing, no crackles, no rhonchi  Abdomen: Soft, non tender, non distended, bowel sounds present, no guarding  Extremities: No edema, pulses DP and PT palpable bilaterally   Data Reviewed: Basic Metabolic Panel:  Recent Labs Lab 11/21/14 1419 11/21/14 2145 11/22/14 0315  NA 139  --  135  K 3.8  --  3.6  CL 107  --  106  CO2 26  --  24  GLUCOSE 89  --  95  BUN 9  --  5*  CREATININE 0.97  --  0.83  CALCIUM 8.7*  --  8.4*  MG  --  2.1  --    Liver Function Tests:  Recent Labs Lab 11/21/14 1419  AST 19  ALT 11*  ALKPHOS 59  BILITOT 0.7  PROT 7.6  ALBUMIN 3.4*    Recent Labs Lab 11/21/14 1419  LIPASE 22   CBC:  Recent Labs Lab 11/21/14 1419 11/21/14 2145 11/22/14 0315 11/22/14 1128  WBC 11.4* 8.9 8.7 9.8  HGB 12.8* 12.4* 12.9* 12.6*  HCT 40.4 38.1* 39.8 40.3  MCV 88.8 88.0 88.1 88.8  PLT 485* 462* 498* 507*    Scheduled Meds: . donepezil  5 mg Oral QHS  . finasteride  5 mg Oral Daily  .  mirtazapine  7.5 mg Oral QHS  . pantoprazole (PROTONIX) IV  40 mg Intravenous Q12H  . polyethylene glycol  17 g Oral TID  . sodium chloride  1,000 mL Intravenous Once   Continuous Infusions: . sodium chloride 75 mL/hr at 11/21/14 2009

## 2014-11-22 NOTE — Evaluation (Signed)
Physical Therapy Evaluation Patient Details Name: Billy [name] MRN: [id number] DOB: 07-05-1932 Today's Date: 11/22/2014   History of Present Illness  Pt is an 81 year old male with hx of hypertension, hyperlipidemia, BPH, dementia, pandiverticulosis, Alzheimer's disease and admitted 11/21/14 for GI bleed.  Clinical Impression  Pt admitted with above diagnosis. Pt currently with functional limitations due to the deficits listed below (see PT Problem List).  Pt will benefit from skilled PT to increase their independence and safety with mobility to allow discharge to the venue listed below.   Pt currently mobilizing without physical assist however requiring cues for safety, cognitive tasks (such as avoiding/negotiating objects).  Pt poor historian and pleasantly confused.  No f/u PT recommended however would recommend supervision at home due to decreased cognition for safety.     Follow Up Recommendations No PT follow up;Supervision for mobility/OOB    Equipment Recommendations  None recommended by PT    Recommendations for Other Services       Precautions / Restrictions Precautions Precautions: Fall      Mobility  Bed Mobility               General bed mobility comments: pt up in recliner on arrival  Transfers Overall transfer level: Needs assistance   Transfers: Sit to/from Stand Sit to Stand: Min guard         General transfer comment: min/guard for safety  Ambulation/Gait Ambulation/Gait assistance: Min guard Ambulation Distance (Feet): 200 Feet Assistive device: None Gait Pattern/deviations: Narrow base of support;Decreased stride length;Step-through pattern Gait velocity: decr   General Gait Details: pt occasionally reaching out for hand rail (possible furniture walker at home), no overt LOB or unsteadiness observed, verbal cues for avoiding/negotiating objects  Stairs            Wheelchair Mobility    Modified Rankin (Stroke Patients Only)        Balance Overall balance assessment: Needs assistance (?denies falls)         Standing balance support: No upper extremity supported Standing balance-Leahy Scale: Fair                               Pertinent Vitals/Pain Pain Assessment: No/denies pain    Home Living Family/patient expects to be discharged to:: Private residence Living Arrangements: Other (Comment) (caregiver per chart)               Additional Comments: pt poor historian, [place]    Prior Function           Comments: pt reports he is ambulatory without assistive device     Hand Dominance        Extremity/Trunk Assessment               Lower Extremity Assessment: Generalized weakness (diffuse muscle atrophy)         Communication   Communication: No difficulties  Cognition Arousal/Alertness: Awake/alert Behavior During Therapy:  (pleasantly confused, follows commands) Overall Cognitive Status: No family/caregiver present to determine baseline cognitive functioning (hx of dementia)                      General Comments      Exercises        Assessment/Plan    PT Assessment Patient needs continued PT services  PT Diagnosis Difficulty walking   PT Problem List Decreased mobility;Decreased strength  PT Treatment Interventions DME  instruction;Gait training;Stair training;Functional mobility training;Patient/family education;Therapeutic activities;Therapeutic exercise   PT Goals (Current goals can be found in the Care Plan section) Acute Rehab PT Goals Patient Stated Goal: reports he wants to go home PT Goal Formulation: With patient Time For Goal Achievement: 12/06/14 Potential to Achieve Goals: Good    Frequency Min 3X/week   Barriers to discharge        Co-evaluation               End of Session Equipment Utilized During Treatment: Gait belt Activity Tolerance: Patient tolerated treatment well Patient  left: in chair;with call bell/phone within reach;with chair alarm set           Time: 4098-1191 PT Time Calculation (min) (ACUTE ONLY): 12 min   Charges:   PT Evaluation $Initial PT Evaluation Tier I: 1 Procedure     PT G Codes:        Billy [name],Billy [name] 11/22/2014, 10:22 AM Billy [name], PT, DPT 11/22/2014 Pager: [phone]

## 2014-11-22 NOTE — Progress Notes (Signed)
Occupational Therapy Evaluation Patient Details Name: Billy KiefSylvester [name] MRN: [id number] DOB: 21-Jul-1932 Today's Date: 11/22/2014    History of Present Illness Pt is an 81 year old male with hx of hypertension, hyperlipidemia, BPH, dementia, pandiverticulosis, Alzheimer's disease and admitted 11/21/14 for GI bleed.   Clinical Impression   PTA, pt lived at home with family who provided 24/7 S. Completed education with family regarding home safety and reducing risk of falls. Pt safe to D/C home with 24/7 assistance of family when medically stable. No further acute OT needs. OT signing off.     Follow Up Recommendations  No OT follow up;Supervision/Assistance - 24 hour    Equipment Recommendations  Tub/shower seat;Other (comment) (grab bars in tub)    Recommendations for Other Services       Precautions / Restrictions Precautions Precautions: Fall      Mobility Bed Mobility Overal bed mobility: Independent                Transfers Overall transfer level: Needs assistance   Transfers: Sit to/from Stand;Stand Pivot Transfers Sit to Stand: Min guard Stand pivot transfers: Min guard       General transfer comment: min guard for safety    Balance             Standing balance-Leahy Scale: Fair                              ADL Overall ADL's : Needs assistance/impaired                                     Functional mobility during ADLs: Min guard; close to baseline functional level General ADL Comments: Pt able to complete basic bathing/dressing. Increased cueing required due to unfamiliar surroundings. educated family on recommendationto install grab bars @ tub and use a showerseat for bating and to S pt during showers to reduce risk of falls. Also discused removing scatter rugs to decrease risk of falls.                      Pertinent Vitals/Pain Pain Assessment: Faces Faces Pain Scale: No hurt     Hand Dominance  Right   Extremity/Trunk Assessment Upper Extremity Assessment Upper Extremity Assessment: Overall WFL for tasks assessed   Lower Extremity Assessment Lower Extremity Assessment: Defer to PT evaluation   Cervical / Trunk Assessment Cervical / Trunk Assessment: Normal   Communication Communication Communication: No difficulties   Cognition Arousal/Alertness: Awake/alert Behavior During Therapy: Restless Overall Cognitive Status: History of cognitive impairments - at baseline                                        Home Living Family/patient expects to be discharged to:: Private residence Living Arrangements: Other relatives Available Help at Discharge: Family;Available 24 hours/day Type of Home: House Home Access: Stairs to enter Entergy CorporationEntrance Stairs-Number of Steps: 3 Entrance Stairs-Rails: Can reach both Home Layout: One level     Bathroom Shower/Tub: Tub/shower unit Shower/tub characteristics: Engineer, building servicesCurtain Bathroom Toilet: Standard Bathroom Accessibility: Yes How Accessible: Accessible via walker Home Equipment: None          Prior Functioning/Environment Level of Independence: Needs assistance  Gait / Transfers Assistance Needed: S ADL's / Homemaking Assistance Needed: set  up/S   Comments: Family is with pt 24/7 and assists as needed with ADL     OT Diagnosis: Generalized weakness;Cognitive deficits   OT Problem List: Decreased activity tolerance   OT Treatment/Interventions:      OT Goals(Current goals can be found in the care plan section) Acute Rehab OT Goals Patient Stated Goal: none stated OT Goal Formulation: All assessment and education complete, DC therapy  OT Frequency:     Barriers to D/C:            Co-evaluation              End of Session Equipment Utilized During Treatment: Gait belt Nurse Communication: Mobility status  Activity Tolerance: Patient tolerated treatment well Patient left: in bed;with call bell/phone within  reach;with family/visitor present   Time: 1640-1705 OT Time Calculation (min): 25 min Charges:  OT General Charges $OT Visit: 1 Procedure OT Evaluation $Initial OT Evaluation Tier I: 1 Procedure OT Treatments $Self Care/Home Management : 8-22 mins G-Codes:    Azula Zappia,HILLARY 12-03-2014, 5:11 PM   [facility], [name]  [phone] 12/03/14

## 2014-11-22 NOTE — Progress Notes (Signed)
[facility] PROGRESS NOTE Subjective Pt still has some blood in stool he thinks on liquids no pain  Objective: Vital signs in last 24 hours: Temp:  [97.6 F (36.4 C)-98.4 F (36.9 C)] 98.3 F (36.8 C) (05/03 0508) Pulse Rate:  [85-117] 85 (05/03 0508) Resp:  [18-20] 18 (05/03 0508) BP: (130-152)/(61-93) 152/90 mmHg (05/03 0508) SpO2:  [92 %-100 %] 100 % (05/03 0508) Weight:  [60.147 kg (132 lb 9.6 oz)-68.04 kg (150 lb)] 60.147 kg (132 lb 9.6 oz) (05/03 0515) Last BM Date: 11/21/14  Intake/Output from previous day: 05/02 0701 - 05/03 0700 In: 1738.8 [I.V.:1738.8] Out: 210 [Urine:210] Intake/Output this shift: Total I/O In: -  Out: 75 [Urine:75]  PE: General--NAD  Lungs--clear Abdomen--nontender  Lab Results:  Recent Labs  11/21/14 1419 11/21/14 2145 11/22/14 0315  WBC 11.4* 8.9 8.7  HGB 12.8* 12.4* 12.9*  HCT 40.4 38.1* 39.8  PLT 485* 462* 498*   BMET  Recent Labs  11/21/14 1419 11/22/14 0315  NA 139 135  K 3.8 3.6  CL 107 106  CO2 26 24  CREATININE 0.97 0.83   LFT  Recent Labs  11/21/14 1419  PROT 7.6  AST 19  ALT 11*  ALKPHOS 59  BILITOT 0.7   PT/INR No results for input(s): LABPROT, INR in the last 72 hours. PANCREAS  Recent Labs  11/21/14 1419  LIPASE 22         Studies/Results: X-ray Chest Pa And Lateral  11/21/2014   CLINICAL DATA:  Shortness of breath and leukocytosis.  EXAM: CHEST  2 VIEW  COMPARISON:  PA and lateral chest 10/03/2011.  FINDINGS: Lung volumes are lower than on the comparison examination but the lungs are clear. Heart size is normal. No pneumothorax or pleural effusion. No focal bony abnormality.  IMPRESSION: No acute disease.   Electronically Signed   By: Drusilla Kannerhomas  Dalessio M.D.   On: 11/21/2014 21:39    Medications: I have reviewed the patient's current medications.  Assessment/Plan: 1. LGI Bleed. Probably diverticular seems to have stopped. Will stop senna and change to miralax to clear GI tract  and see if the stools clear Will check HOs   Ramez Arrona JR,Kaitelyn Jamison L 11/22/2014, 10:05 AM  Pager: [phone] If no answer or after hours call [phone]

## 2014-11-23 DIAGNOSIS — R197 Diarrhea, unspecified: Secondary | ICD-10-CM

## 2014-11-23 DIAGNOSIS — E43 Unspecified severe protein-calorie malnutrition: Secondary | ICD-10-CM

## 2014-11-23 DIAGNOSIS — F0391 Unspecified dementia with behavioral disturbance: Secondary | ICD-10-CM

## 2014-11-23 DIAGNOSIS — D649 Anemia, unspecified: Secondary | ICD-10-CM

## 2014-11-23 LAB — CBC
HCT: 39.7 % (ref 39.0–52.0)
Hemoglobin: 12.9 g/dL — ABNORMAL LOW (ref 13.0–17.0)
MCH: 28.5 pg (ref 26.0–34.0)
MCHC: 32.5 g/dL (ref 30.0–36.0)
MCV: 87.6 fL (ref 78.0–100.0)
PLATELETS: 527 10*3/uL — AB (ref 150–400)
RBC: 4.53 MIL/uL (ref 4.22–5.81)
RDW: 14.1 % (ref 11.5–15.5)
WBC: 8 10*3/uL (ref 4.0–10.5)

## 2014-11-23 LAB — URINE CULTURE: Colony Count: 15000

## 2014-11-23 LAB — BASIC METABOLIC PANEL
Anion gap: 10 (ref 5–15)
BUN: 6 mg/dL (ref 6–20)
CALCIUM: 9 mg/dL (ref 8.9–10.3)
CO2: 24 mmol/L (ref 22–32)
Chloride: 107 mmol/L (ref 101–111)
Creatinine, Ser: 0.76 mg/dL (ref 0.61–1.24)
GFR calc non Af Amer: >60 mL/min (ref >60)
Glucose, Bld: 90 mg/dL (ref 70–99)
Potassium: 3.7 mmol/L (ref 3.5–5.1)
Sodium: 141 mmol/L (ref 135–145)

## 2014-11-23 MED ORDER — ENSURE ENLIVE PO LIQD
237.0000 mL | Freq: Three times a day (TID) | ORAL | Status: DC
  Administered 2014-11-24 – 2014-11-25 (×4): 237 mL via ORAL

## 2014-11-23 NOTE — Progress Notes (Signed)
PT Cancellation Note  Patient Details Name: Jerald KiefSylvester Franklyn MRN: [id number] DOB: [birth year]   Cancelled Treatment:    Reason Eval/Treat Not Completed: Nursing requested PT be held today-pt with bloody diarrhea. Will check back another day.    Rebeca AlertJannie Rollan Roger, MPT Pager: [phone]

## 2014-11-23 NOTE — Progress Notes (Signed)
[facility] PROGRESS NOTE Subjective patient denies any further gross bleeding. Complaining of problems with his urinary catheter.  Objective: Vital signs in last 24 hours: Temp:  [97.5 F (36.4 C)-98.7 F (37.1 C)] 97.5 F (36.4 C) (05/04 1248) Pulse Rate:  [86-111] 111 (05/04 1248) Resp:  [17-19] 18 (05/04 1248) BP: (130-142)/(75-92) 142/84 mmHg (05/04 1248) SpO2:  [99 %-100 %] 99 % (05/04 1248) Weight:  [59.9 kg (132 lb 0.9 oz)] 59.9 kg (132 lb 0.9 oz) (05/04 1053) Last BM Date: 11/23/14  Intake/Output from previous day: 05/03 0701 - 05/04 0700 In: 2215 [P.O.:340; I.V.:1875] Out: 75 [Urine:75] Intake/Output this shift: Total I/O In: -  Out: 450 [Urine:450]  PE: General--no acute distress  Abdomen-- soft and nontender  Lab Results:  Recent Labs  11/21/14 1419 11/21/14 2145 11/22/14 0315 11/22/14 1128 11/23/14 0445  WBC 11.4* 8.9 8.7 9.8 8.0  HGB 12.8* 12.4* 12.9* 12.6* 12.9*  HCT 40.4 38.1* 39.8 40.3 39.7  PLT 485* 462* 498* 507* 527*   BMET  Recent Labs  11/21/14 1419 11/22/14 0315 11/23/14 0445  NA 139 135 141  K 3.8 3.6 3.7  CL 107 106 107  CO2 26 24 24   CREATININE 0.97 0.83 0.76   LFT  Recent Labs  11/21/14 1419  PROT 7.6  AST 19  ALT 11*  ALKPHOS 59  BILITOT 0.7   PT/INR No results for input(s): LABPROT, INR in the last 72 hours. PANCREAS  Recent Labs  11/21/14 1419  LIPASE 22         Studies/Results: X-ray Chest Pa And Lateral  11/21/2014   CLINICAL DATA:  Shortness of breath and leukocytosis.  EXAM: CHEST  2 VIEW  COMPARISON:  PA and lateral chest 10/03/2011.  FINDINGS: Lung volumes are lower than on the comparison examination but the lungs are clear. Heart size is normal. No pneumothorax or pleural effusion. No focal bony abnormality.  IMPRESSION: No acute disease.   Electronically Signed   By: Drusilla Kannerhomas  Dalessio M.D.   On: 11/21/2014 21:39    Medications: I have reviewed the patient's current  medications.  Assessment/Plan: 1. Lower G.I. bleed. Appears to be stable. At this point would not recommend any further diagnostic testing. No further bleeding would have him follow-up with this PCP. We will be happy to see him back again as needed.   Shareen Capwell JR,Harlo Fabela L 11/23/2014, 3:44 PM  Pager: [phone] If no answer or after hours call [phone]

## 2014-11-23 NOTE — Progress Notes (Signed)
PROGRESS NOTE  Billy KiefSylvester [name] MVH:[id number] DOB: [birth year] DOA: 11/21/2014 PCP: Georgann HousekeeperHUSAIN,KARRAR, MD  Summary: 81 year old man with history of dementia, pandiverticulosis presented with bleeding. Admitted for presumed lower GI bleed.  Assessment/Plan: 1. GIB. Patient reports some minimal bleeding however his hemoglobin remains entirely stable. Pandiverticulosis. No further testing per GI. 2. Diarrhea , present for approximately one week for healthcare power of attorney. He has had no fever or symptoms to suggest infection. 3. Normocytic anemia. Mild. Stable since admission. 4. Dementia, currently stable although he has had significant behavioral issues at home as well including aggressive behavior which is not new. He has required restraints both for his safety and the safety of staff. His power of attorney is in agreement. 5. Severe malnutrition   Overall he appears to be doing well. I discussed all workup thus far, and GI recommendations and laboratory studies with his healthcare power of attorney at bedside. We discussed the rationale for no further testing at this point and the fact that his hemoglobin stable.  He has had no recent antibiotics but given ongoing diarrhea, check stool studies. If these are unremarkable would anticipate discharge 5/5 on Imodium. His healthcare power of attorney was in agreement.  Code Status: full code DVT prophylaxis: SCDs Family Communication:  Disposition Plan: home 5/5  Brendia Sacksaniel Kriston Mckinnie, MD  [facility]  Pager [phone] If 7PM-7AM, please contact night-coverage at [email or web address], password [email or web address] 11/23/2014, 4:55 PM  LOS: 2 days   Consultants:  GI   Physical therapy: No PT needed.  OT: no follow-up  Procedures:    Antibiotics:    HPI/Subjective: Difficult night very verbally abusive and combative with the staff.  Currently feeling well. He has no complaints. No nausea, vomiting or abdominal pain. Power of attorney/niece at  bedside reports he has had ongoing diarrhea for approximately a week with multiple bowel movements today. Still having a little bit of blood in bowel movements.  Objective: Filed Vitals:   11/22/14 2159 11/23/14 0421 11/23/14 1053 11/23/14 1248  BP: 136/92 130/75  142/84  Pulse: 86 99  111  Temp: 97.6 F (36.4 C) 98.7 F (37.1 C)  97.5 F (36.4 C)  TempSrc: Axillary Oral  Oral  Resp: 19 17  18   Height:   5\' 9"  (1.753 m)   Weight:  59.9 kg (132 lb 0.9 oz) 59.9 kg (132 lb 0.9 oz)   SpO2: 100% 99%  99%    Intake/Output Summary (Last 24 hours) at 11/23/14 1655 Last data filed at 11/23/14 1249  Gross per 24 hour  Intake   2215 ml  Output    450 ml  Net   1765 ml     Filed Weights   11/22/14 0515 11/23/14 0421 11/23/14 1053  Weight: 60.147 kg (132 lb 9.6 oz) 59.9 kg (132 lb 0.9 oz) 59.9 kg (132 lb 0.9 oz)    Exam:     Afebrile, vital signs stable. General:  Appears calm and comfortable. Well-appearing. Cardiovascular: RRR, no m/r/g. No LE edema. Respiratory: CTA bilaterally, no w/r/r. Normal respiratory effort. Abdomen: soft, ntnd Psychiatric: grossly normal mood and affect, speech fluent and appropriate Neurologic: grossly non-focal.  New data reviewed: Hemoglobin remained stable, 12.9.  Basic metabolic panel unremarkable   Scheduled Meds: . donepezil  5 mg Oral QHS  . feeding supplement (ENSURE ENLIVE)  237 mL Oral TID BM  . finasteride  5 mg Oral Daily  . mirtazapine  7.5 mg Oral QHS  . NIFEdipine  30 mg Oral Daily  . pantoprazole (  PROTONIX) IV  40 mg Intravenous Q12H  . polyethylene glycol  17 g Oral TID  . sodium chloride  1,000 mL Intravenous Once   Continuous Infusions: . sodium chloride 75 mL/hr at 11/23/14 1510    Principal Problem:   GI bleed Active Problems:   Diverticulosis   Leukocytosis   HTN (hypertension)   Dementia   BPH (benign prostatic hyperplasia)   Diarrhea   Normocytic anemia   Protein-calorie malnutrition, severe   Time spent  35 minutes, greater than 50% in counseling and coordination of care

## 2014-11-23 NOTE — Progress Notes (Signed)
Initial Nutrition Assessment  DOCUMENTATION CODES:  Severe malnutrition in context of chronic illness  INTERVENTION: - Will order Ensure Enlive po TID, each supplement provides 350 kcal and 20 grams of protein - RD to continue to monitor for needs   NUTRITION DIAGNOSIS:  Malnutrition related to lethargy/confusion, chronic illness as evidenced by severe depletion of muscle mass, percent weight loss.  GOAL:  Patient will meet greater than or equal to 90% of their needs  MONITOR:  Supplement acceptance, Labs, Weight trends, Skin, I & O's  REASON FOR ASSESSMENT:  Consult Assessment of nutrition requirement/status  ASSESSMENT: Pt is an 81 year old with hx of pandiverticulitis, hyperlipidemia, BPH, HTN, and dementia. Pt was agitated yesterday and placed on restraints; unable to perform physical assessment but visualized severe muscle wasting to shoulder and clavicle area.  Pt seen for consult. BMI indicates normal weight status. He reports good appetite with no problems chewing or swallowing. Niece in the room reports that pt does not eat much at each meal. Both pt and niece are interested in pt receiving Ensure as he drinks this at home; will order TID. Pt reports UBW of 145 lbs and that he last weighed this a few months ago; 9% body weight loss. Pt ate 50-100% of meals yesterday so likely meeting needs. Labs and medications reviewed.   Height:  Ht Readings from Last 1 Encounters:  11/23/14 5\' 9"  (1.753 m)    Weight:  Wt Readings from Last 1 Encounters:  11/23/14 132 lb 0.9 oz (59.9 kg)    Ideal Body Weight:  72.7 kg (kg)  Wt Readings from Last 10 Encounters:  11/23/14 132 lb 0.9 oz (59.9 kg)    BMI:  Body mass index is 19.49 kg/(m^2).  Estimated Nutritional Needs:  Kcal:  1300-1500  Protein:  60-80 grams  Fluid:  2L/day  Skin:  Reviewed, no issues  Diet Order:  Diet regular Room service appropriate?: Yes; Fluid consistency:: Thin  EDUCATION NEEDS:  No  education needs identified at this time   Intake/Output Summary (Last 24 hours) at 11/23/14 1057 Last data filed at 11/23/14 0700  Gross per 24 hour  Intake   2075 ml  Output      0 ml  Net   2075 ml    Last BM:  5/4  Trenton GammonJessica Terah Robey, RD, LDN Inpatient Clinical Dietitian Pager # [phone] After hours/weekend pager # [phone]

## 2014-11-24 LAB — CBC
HCT: 40.8 % (ref 39.0–52.0)
Hemoglobin: 13.3 g/dL (ref 13.0–17.0)
MCH: 28.7 pg (ref 26.0–34.0)
MCHC: 32.6 g/dL (ref 30.0–36.0)
MCV: 87.9 fL (ref 78.0–100.0)
PLATELETS: 562 10*3/uL — AB (ref 150–400)
RBC: 4.64 MIL/uL (ref 4.22–5.81)
RDW: 14.2 % (ref 11.5–15.5)
WBC: 9.5 10*3/uL (ref 4.0–10.5)

## 2014-11-24 MED ORDER — LOPERAMIDE HCL 2 MG PO CAPS: 2.0000 mg | ORAL_CAPSULE | ORAL | Status: DC | PRN

## 2014-11-24 NOTE — Progress Notes (Signed)
Physical Therapy Treatment Patient Details Name: Billy KiefSylvester [name] MRN: [id number] DOB: Nov 04, 1931 Today's Date: 11/24/2014    History of Present Illness Pt is an 81 year old male with hx of hypertension, hyperlipidemia, BPH, dementia, pandiverticulosis, Alzheimer's disease and admitted 11/21/14 for GI bleed.    PT Comments    PT in bed with sitter present and restraints on pt due to wanting to always get OOB; Pt very impulsive and eager to get OOB and stand up; requested RN/NT assistance due to soiled linens (bed pad) from patient; Pt to EOB several cues to remain seated for safety; sit to stand and ambulate in whole unit hall 350 ft no assistive device; Pt required several cues to return back bed stood briefly in doorway (Cognitive Impairments); returned pt back to bed; pt requested to use BR (to void) but has catheter (appeared to have forgotten at moment).   Follow Up Recommendations  No PT follow up;Supervision for mobility/OOB     Equipment Recommendations  None recommended by PT    Recommendations for Other Services       Precautions / Restrictions Precautions Precautions: Fall    Mobility  Bed Mobility Overal bed mobility: Modified Independent             General bed mobility comments: Pt in bed w/ sitter present and restraints   Transfers Overall transfer level: Needs assistance   Transfers: Sit to/from Stand Sit to Stand: Supervision;Min guard         General transfer comment: Min guard supervision for safety   Ambulation/Gait Ambulation/Gait assistance: Supervision;Min guard Ambulation Distance (Feet): 350 Feet Assistive device: None Gait Pattern/deviations: Scissoring;Step-through pattern;Narrow base of support;Drifts right/left     General Gait Details: Pt ambulating; occasional mild LOB; swaying; VCs for staying in line from drifting.   Stairs            Wheelchair Mobility    Modified Rankin (Stroke Patients Only)       Balance                                     Cognition Arousal/Alertness: Awake/alert Behavior During Therapy: Impulsive;Restless ([place] attempts to get OOB and stand up) Overall Cognitive Status: History of cognitive impairments - at baseline (Impaired to follow verbal commands on occasion )                      Exercises      General Comments        Pertinent Vitals/Pain Pain Assessment: No/denies pain    Home Living                      Prior Function            PT Goals (current goals can now be found in the care plan section) Progress towards PT goals: Progressing toward goals    Frequency  Min 3X/week    PT Plan      Co-evaluation             End of Session Equipment Utilized During Treatment: Gait belt Activity Tolerance: Patient tolerated treatment well Patient left: in bed;with nursing/sitter in room;with call bell/phone within reach     Time: [id number] PT Time Calculation (min) (ACUTE ONLY): 21 min  Charges:  $Gait Training: 8-22 mins  G Codes:      Duric,Sreto Student PTA 11/24/2014, 2:00 PM   Felecia ShellingLori Naketa Daddario  PTA [facility]  Acute  Rehab Pager      [phone]

## 2014-11-24 NOTE — Plan of Care (Signed)
Problem: Phase III Progression Outcomes Goal: Voiding independently Outcome: Completed/Met Date Met:  11/24/14 incontinent

## 2014-11-24 NOTE — Progress Notes (Signed)
PROGRESS NOTE  Jerald KiefSylvester Bodkin ZOX:[id number] DOB: Feb 27, 1932 DOA: 11/21/2014 PCP: Georgann HousekeeperHUSAIN,KARRAR, MD  Summary: 81 year old man with history of dementia, pandiverticulosis presented with bleeding. Admitted for presumed lower GI bleed.  Assessment/Plan: 1. GIB. Patient reports some minimal bleeding however his hemoglobin remains entirely stable. Pandiverticulosis. No further testing per GI. 2. Diarrhea, present for approximately one week for healthcare power of attorney. He has had no fever or symptoms to suggest infection. No fever or leukocytosis. Stool has been watery, likely related to MiraLAX. 3. Normocytic anemia. Resolved. Hemoglobin is normal. 4. Dementia, currently stable although he has had significant behavioral issues at home as well including aggressive behavior which is not new. Restraints have been removed. He has been very calm. 5. Severe malnutrition.   Doing well. No significant bleeding. Hemoglobin stable.  He has had no recent antibiotics; no reason to suspect C. difficile or infectious diarrhea at this point. Normal WBC, nontoxic. Suspect diarrhea related to MiraLAX. Trial of Imodium, discussed with niece at bedside in detail.  Anticipate discharge 5/6 if stable  Code Status: full code DVT prophylaxis: SCDs Family Communication:  Disposition Plan: home 5/6  Brendia Sacksaniel Goodrich, MD  [facility]  Pager [phone] If 7PM-7AM, please contact night-coverage at [email or web address], password [email or web address] 11/24/2014, 12:39 PM  LOS: 3 days   Consultants:  GI   Physical therapy: No PT needed.  OT: no follow-up  Procedures:    Antibiotics:    HPI/Subjective: Some watery stools per nursing. Of note the patient's been on MiraLAX which was discontinued today.  No pain, no nausea or vomiting. Tolerating diet. Nursing reports watery diarrhea with mild red discoloration.  Objective: Filed Vitals:   11/23/14 1053 11/23/14 1248 11/23/14 2023 11/24/14 0640  BP:  142/84  135/88 137/93  Pulse:  111 115 105  Temp:  97.5 F (36.4 C) 98.3 F (36.8 C) 97.5 F (36.4 C)  TempSrc:  Oral Oral Oral  Resp:  18 20 20   Height: 5\' 9"  (1.753 m)     Weight: 59.9 kg (132 lb 0.9 oz)   57.3 kg (126 lb 5.2 oz)  SpO2:  99% 99% 99%    Intake/Output Summary (Last 24 hours) at 11/24/14 1239 Last data filed at 11/24/14 0700  Gross per 24 hour  Intake 1551.25 ml  Output   1175 ml  Net 376.25 ml     Filed Weights   11/23/14 0421 11/23/14 1053 11/24/14 0640  Weight: 59.9 kg (132 lb 0.9 oz) 59.9 kg (132 lb 0.9 oz) 57.3 kg (126 lb 5.2 oz)    Exam:     Afebrile, vital signs stable. No hypoxia. General:  Appears calm and comfortable Cardiovascular: RRR, no m/r/g. No LE edema. Respiratory: CTA bilaterally, no w/r/r. Normal respiratory effort. Abdomen: soft, ntnd Psychiatric: grossly normal mood and affect, speech fluent and appropriate  New data reviewed: Hemoglobin remained stable, 13.3 Basic metabolic panel unremarkable   Scheduled Meds: . donepezil  5 mg Oral QHS  . feeding supplement (ENSURE ENLIVE)  237 mL Oral TID BM  . finasteride  5 mg Oral Daily  . mirtazapine  7.5 mg Oral QHS  . NIFEdipine  30 mg Oral Daily  . pantoprazole (PROTONIX) IV  40 mg Intravenous Q12H  . sodium chloride  1,000 mL Intravenous Once   Continuous Infusions:    Principal Problem:   GI bleed Active Problems:   Diverticulosis   Leukocytosis   HTN (hypertension)   Dementia   BPH (benign prostatic hyperplasia)   Diarrhea   Normocytic  anemia   Protein-calorie malnutrition, severe   Time spent 20 minutes

## 2014-11-25 DIAGNOSIS — K5731 Diverticulosis of large intestine without perforation or abscess with bleeding: Secondary | ICD-10-CM

## 2014-11-25 MED ORDER — LORAZEPAM 2 MG/ML IJ SOLN: 1.0000 mg | Freq: Once | INTRAMUSCULAR | Status: DC

## 2014-11-25 MED ORDER — LORAZEPAM 2 MG/ML IJ SOLN
1.0000 mg | Freq: Once | INTRAMUSCULAR | Status: AC
  Administered 2014-11-25: 1 mg via INTRAMUSCULAR
  Filled 2014-11-25: qty 1

## 2014-11-25 NOTE — Progress Notes (Signed)
  PROGRESS NOTE  Billy KiefSylvester [name] ZOX:[id number] DOB: [birth year] DOA: 11/21/2014 PCP: Georgann HousekeeperHUSAIN,KARRAR, MD  Summary: 81 year old man with history of dementia, pandiverticulosis presented with bleeding. Admitted for presumed lower GI bleed.  Assessment/Plan: 1. GIB. Resolved, no bleeding. Hgb stable. Pandiverticulosis. No further testing per GI. 2. Diarrhea, resolved.. Present for approximately one week for healthcare power of attorney. He has had symptoms to suggest infection. No leukocytosis.   3. Fever. Isolated at night. No recurrence. Asymptomatic. No source. Suspect atelectasis/spurious. 4. Normocytic anemia. Resolved. Hemoglobin normal. 5. Dementia, currently stable 6. Severe malnutrition.   Doing well, diarrhea and bleeding have resolved.  Discussed in detail with HCPOA/niece at bedside, discusssed fever, likely atelectasis. Ready to go home.  Code Status: full code DVT prophylaxis: SCDs Family Communication:  Disposition Plan: home 5/6  Brendia Sacksaniel Goodrich, MD  [facility]  Pager [phone] If 7PM-7AM, please contact night-coverage at [email or web address], password [email or web address] 11/25/2014, 12:37 PM  LOS: 4 days   Consultants:  GI   Physical therapy: No PT needed.  OT: no follow-up  Procedures:    Antibiotics:    HPI/Subjective: Agitated overnight. One episode of fever overnight. No MD notified.  Doing well, no complaints. No diarrhea per RN. No bleeding.  Objective: Filed Vitals:   11/24/14 0640 11/24/14 1300 11/24/14 2031 11/25/14 0722  BP: 137/93 122/76 145/89 166/98  Pulse: 105 75 65 94  Temp: 97.5 F (36.4 C) 97.3 F (36.3 C) 101 F (38.3 C) 98.3 F (36.8 C)  TempSrc: Oral Oral Oral Oral  Resp: 20 18 18 20   Height:      Weight: 57.3 kg (126 lb 5.2 oz)     SpO2: 99% 100% 100% 94%    Intake/Output Summary (Last 24 hours) at 11/25/14 1237 Last data filed at 11/25/14 0949  Gross per 24 hour  Intake    120 ml  Output      0 ml  Net    120 ml     Filed  Weights   11/23/14 0421 11/23/14 1053 11/24/14 0640  Weight: 59.9 kg (132 lb 0.9 oz) 59.9 kg (132 lb 0.9 oz) 57.3 kg (126 lb 5.2 oz)    Exam:     Tmax 101, VSS. No hypoxia. General:  Appears comfortable, calm. Cardiovascular: Regular rate and rhythm, no murmur, rub or gallop. No lower extremity edema. Respiratory: Clear to auscultation bilaterally, no wheezes, rales or rhonchi. Normal respiratory effort. Abdomen: soft, ntnd Psychiatric: grossly normal mood and affect, speech fluent and appropriate  New data reviewed: No labs  Scheduled Meds: . donepezil  5 mg Oral QHS  . feeding supplement (ENSURE ENLIVE)  237 mL Oral TID BM  . finasteride  5 mg Oral Daily  . LORazepam  1 mg Intramuscular Once  . mirtazapine  7.5 mg Oral QHS  . NIFEdipine  30 mg Oral Daily  . sodium chloride  1,000 mL Intravenous Once   Continuous Infusions:    Principal Problem:   GI bleed Active Problems:   Diverticulosis   Leukocytosis   HTN (hypertension)   Dementia   BPH (benign prostatic hyperplasia)   Diarrhea   Normocytic anemia   Protein-calorie malnutrition, severe

## 2014-11-25 NOTE — Progress Notes (Signed)
Pt agitated and pacing in room. Does not want to go to bed to sleep despite redirection. On Call NP paged. Will continue to monitor pt and carry out POC. Mardene CelesteAsaro, Eivin Mascio I

## 2014-11-25 NOTE — Discharge Summary (Signed)
Physician Discharge Summary  Jerald KiefSylvester Schwark ZOX:[id number] DOB: [birth year] DOA: 11/21/2014  PCP: Georgann HousekeeperHUSAIN,KARRAR, MD  Admit date: 11/21/2014 Discharge date: 11/25/2014  Recommendations for Outpatient Follow-up:  1. Follow-up severe malnutrition 2. Instructed to return for recurrent bleeding.    Follow-up Information    Follow up with Georgann HousekeeperHUSAIN,KARRAR, MD. Schedule an appointment as soon as possible for a visit in 2 weeks.   Specialty:  Internal Medicine   Contact information:   [address] [place] [address] [phone]      Discharge Diagnoses:  1. GI bleed, presumed diverticular in nature. 2. Diarrhea, resolved 3. Normocytic anemia, resolved 4. Severe malnutrition 5. Dementia  Discharge Condition: improved Disposition: home  Diet recommendation: regular  Filed Weights   11/23/14 0421 11/23/14 1053 11/24/14 0640  Weight: 59.9 kg (132 lb 0.9 oz) 59.9 kg (132 lb 0.9 oz) 57.3 kg (126 lb 5.2 oz)    History of present illness:  81 year old man with history of dementia, pandiverticulosis presented with bleeding. Admitted for presumed lower GI bleed.  Hospital Course:  Mr. Basilia JumboCovington was admitted for further evaluation of GI bleed. He had no significant blood loss, no significant change in his hemoglobin and did not require any blood products. He was seen by GI in consultation and recommended conservative management, no further evaluation. Bleeding stopped and his hospitalization was uncomplicated. He did have several episodes of diarrhea secondary to MiraLAX. Resolved on MiraLAX was stopped. Discussed in detail with his healthcare power of attorney including transient fever as below.   1. GIB. Resolved, no bleeding. Hgb stable. Pandiverticulosis. No further testing per GI. 2. Diarrhea, resolved.. Present for approximately one week for healthcare power of attorney. He has had symptoms to suggest infection. No leukocytosis. Resolved at this point.  3. Fever.  Isolated at night. No recurrence. Asymptomatic. No source. Suspect atelectasis/spurious. 4. Normocytic anemia. Resolved. Hemoglobin normal. 5. Dementia, currently stable 6. Severe malnutrition.  Consultants:  GI   Physical therapy: No PT needed.  OT: no follow-up  Discharge Instructions  Discharge Instructions    Activity as tolerated - No restrictions    Complete by:  As directed      Diet general    Complete by:  As directed      Discharge instructions    Complete by:  As directed   Call your physician or seek immediate medical attention for bleeding, pain, diarrhea or worsening of condition.          Current Discharge Medication List    CONTINUE these medications which have NOT CHANGED   Details  donepezil (ARICEPT) 5 MG tablet Take 1 tablet by mouth at bedtime. Refills: 11    finasteride (PROSCAR) 5 MG tablet Take 1 tablet by mouth daily. Refills: 11    mirtazapine (REMERON) 7.5 MG tablet Take 1 tablet by mouth at bedtime. Refills: 11    NIFEdipine (PROCARDIA-XL/ADALAT-CC/NIFEDICAL-XL) 30 MG 24 hr tablet Take 30 mg by mouth daily. Refills: 11       No Known Allergies  The results of significant diagnostics from this hospitalization (including imaging, microbiology, ancillary and laboratory) are listed below for reference.    Significant Diagnostic Studies: X-ray Chest Pa And Lateral  11/21/2014   CLINICAL DATA:  Shortness of breath and leukocytosis.  EXAM: CHEST  2 VIEW  COMPARISON:  PA and lateral chest 10/03/2011.  FINDINGS: Lung volumes are lower than on the comparison examination but the lungs are clear. Heart size is normal. No pneumothorax or pleural effusion. No  focal bony abnormality.  IMPRESSION: No acute disease.   Electronically Signed   By: Drusilla Kannerhomas  Dalessio M.D.   On: 11/21/2014 21:39    Microbiology: Recent Results (from the past 240 hour(s))  Urine culture     Status: None   Collection Time: 11/21/14  9:10 PM  Result Value Ref Range Status    Specimen Description URINE, RANDOM  Final   Special Requests RANDOM  Final   Colony Count   Final    15,000 COLONIES/ML Performed at [facility]    Culture   Final    Multiple bacterial morphotypes present, none predominant. Suggest appropriate recollection if clinically indicated. Performed at [facility]    Report Status 11/23/2014 FINAL  Final     Labs: Basic Metabolic Panel:  Recent Labs Lab 11/21/14 1419 11/21/14 2145 11/22/14 0315 11/23/14 0445  NA 139  --  135 141  K 3.8  --  3.6 3.7  CL 107  --  106 107  CO2 26  --  24 24  GLUCOSE 89  --  95 90  BUN 9  --  5* 6  CREATININE 0.97  --  0.83 0.76  CALCIUM 8.7*  --  8.4* 9.0  MG  --  2.1  --   --    Liver Function Tests:  Recent Labs Lab 11/21/14 1419  AST 19  ALT 11*  ALKPHOS 59  BILITOT 0.7  PROT 7.6  ALBUMIN 3.4*    Recent Labs Lab 11/21/14 1419  LIPASE 22   CBC:  Recent Labs Lab 11/21/14 2145 11/22/14 0315 11/22/14 1128 11/23/14 0445 11/24/14 1000  WBC 8.9 8.7 9.8 8.0 9.5  HGB 12.4* 12.9* 12.6* 12.9* 13.3  HCT 38.1* 39.8 40.3 39.7 40.8  MCV 88.0 88.1 88.8 87.6 87.9  PLT 462* 498* 507* 527* 562*    Principal Problem:   GI bleed Active Problems:   Diverticulosis   Leukocytosis   HTN (hypertension)   Dementia   BPH (benign prostatic hyperplasia)   Diarrhea   Normocytic anemia   Protein-calorie malnutrition, severe   Time coordinating discharge: 40 minutes  Signed:  Brendia Sacksaniel Goodrich, MD [facility] 11/25/2014, 2:53 PM

## 2014-11-27 ENCOUNTER — Inpatient Hospital Stay (HOSPITAL_COMMUNITY)
Admission: EM | Admit: 2014-11-27 | Discharge: 2014-12-02 | DRG: 377 | Disposition: A | Discharge status: Skilled Nursing Facility | Payer: Medicare Other | Attending: Family Medicine | Admitting: Family Medicine

## 2014-11-27 ENCOUNTER — Encounter (HOSPITAL_COMMUNITY): Payer: Self-pay | Admitting: Emergency Medicine

## 2014-11-27 DIAGNOSIS — K5791 Diverticulosis of intestine, part unspecified, without perforation or abscess with bleeding: Secondary | ICD-10-CM | POA: Diagnosis present

## 2014-11-27 DIAGNOSIS — I10 Essential (primary) hypertension: Secondary | ICD-10-CM | POA: Diagnosis present

## 2014-11-27 DIAGNOSIS — K5731 Diverticulosis of large intestine without perforation or abscess with bleeding: Secondary | ICD-10-CM | POA: Diagnosis not present

## 2014-11-27 DIAGNOSIS — E43 Unspecified severe protein-calorie malnutrition: Secondary | ICD-10-CM | POA: Diagnosis present

## 2014-11-27 DIAGNOSIS — N179 Acute kidney failure, unspecified: Secondary | ICD-10-CM | POA: Diagnosis present

## 2014-11-27 DIAGNOSIS — K625 Hemorrhage of anus and rectum: Secondary | ICD-10-CM | POA: Diagnosis present

## 2014-11-27 DIAGNOSIS — Z87891 Personal history of nicotine dependence: Secondary | ICD-10-CM

## 2014-11-27 DIAGNOSIS — K922 Gastrointestinal hemorrhage, unspecified: Secondary | ICD-10-CM

## 2014-11-27 DIAGNOSIS — Z66 Do not resuscitate: Secondary | ICD-10-CM | POA: Diagnosis present

## 2014-11-27 DIAGNOSIS — F0280 Dementia in other diseases classified elsewhere without behavioral disturbance: Secondary | ICD-10-CM | POA: Diagnosis present

## 2014-11-27 DIAGNOSIS — K5790 Diverticulosis of intestine, part unspecified, without perforation or abscess without bleeding: Secondary | ICD-10-CM | POA: Diagnosis present

## 2014-11-27 DIAGNOSIS — D500 Iron deficiency anemia secondary to blood loss (chronic): Secondary | ICD-10-CM | POA: Diagnosis not present

## 2014-11-27 DIAGNOSIS — N400 Enlarged prostate without lower urinary tract symptoms: Secondary | ICD-10-CM | POA: Diagnosis present

## 2014-11-27 DIAGNOSIS — Z86010 Personal history of colonic polyps: Secondary | ICD-10-CM

## 2014-11-27 DIAGNOSIS — Z681 Body mass index (BMI) 19 or less, adult: Secondary | ICD-10-CM | POA: Diagnosis not present

## 2014-11-27 DIAGNOSIS — E785 Hyperlipidemia, unspecified: Secondary | ICD-10-CM | POA: Diagnosis present

## 2014-11-27 DIAGNOSIS — D649 Anemia, unspecified: Secondary | ICD-10-CM | POA: Diagnosis present

## 2014-11-27 DIAGNOSIS — G309 Alzheimer's disease, unspecified: Secondary | ICD-10-CM | POA: Diagnosis present

## 2014-11-27 DIAGNOSIS — K649 Unspecified hemorrhoids: Secondary | ICD-10-CM | POA: Diagnosis present

## 2014-11-27 DIAGNOSIS — E860 Dehydration: Secondary | ICD-10-CM | POA: Diagnosis present

## 2014-11-27 DIAGNOSIS — F0390 Unspecified dementia without behavioral disturbance: Secondary | ICD-10-CM | POA: Diagnosis present

## 2014-11-27 LAB — CBC WITH DIFFERENTIAL/PLATELET
Basophils Absolute: 0 10*3/uL (ref 0.0–0.1)
Basophils Relative: 0 % (ref 0–1)
EOS ABS: 1.4 10*3/uL — AB (ref 0.0–0.7)
EOS PCT: 11 % — AB (ref 0–5)
HCT: 36 % — ABNORMAL LOW (ref 39.0–52.0)
Hemoglobin: 11.8 g/dL — ABNORMAL LOW (ref 13.0–17.0)
LYMPHS ABS: 2.7 10*3/uL (ref 0.7–4.0)
Lymphocytes Relative: 21 % (ref 12–46)
MCH: 28.8 pg (ref 26.0–34.0)
MCHC: 32.8 g/dL (ref 30.0–36.0)
MCV: 87.8 fL (ref 78.0–100.0)
MONO ABS: 2.8 10*3/uL — AB (ref 0.1–1.0)
Monocytes Relative: 22 % — ABNORMAL HIGH (ref 3–12)
NEUTROS PCT: 46 % (ref 43–77)
Neutro Abs: 6 10*3/uL (ref 1.7–7.7)
PLATELETS: 486 10*3/uL — AB (ref 150–400)
RBC: 4.1 MIL/uL — AB (ref 4.22–5.81)
RDW: 14.4 % (ref 11.5–15.5)
WBC: 12.9 10*3/uL — ABNORMAL HIGH (ref 4.0–10.5)

## 2014-11-27 LAB — COMPREHENSIVE METABOLIC PANEL
ALK PHOS: 48 U/L (ref 38–126)
ALT: 20 U/L (ref 17–63)
AST: 26 U/L (ref 15–41)
Albumin: 2.8 g/dL — ABNORMAL LOW (ref 3.5–5.0)
Anion gap: 10 (ref 5–15)
BUN: 23 mg/dL — ABNORMAL HIGH (ref 6–20)
CALCIUM: 9 mg/dL (ref 8.9–10.3)
CO2: 27 mmol/L (ref 22–32)
Chloride: 102 mmol/L (ref 101–111)
Creatinine, Ser: 1.56 mg/dL — ABNORMAL HIGH (ref 0.61–1.24)
GFR calc Af Amer: 47 mL/min — ABNORMAL LOW (ref >60)
GFR, EST NON AFRICAN AMERICAN: 40 mL/min — AB (ref >60)
Glucose, Bld: 92 mg/dL (ref 70–99)
Potassium: 3.6 mmol/L (ref 3.5–5.1)
Sodium: 139 mmol/L (ref 135–145)
Total Bilirubin: 0.6 mg/dL (ref 0.3–1.2)
Total Protein: 7 g/dL (ref 6.5–8.1)

## 2014-11-27 LAB — TYPE AND SCREEN
ABO/RH(D): A POS
ANTIBODY SCREEN: NEGATIVE

## 2014-11-27 LAB — POC OCCULT BLOOD, ED: FECAL OCCULT BLD: POSITIVE — AB

## 2014-11-27 LAB — ABO/RH: ABO/RH(D): A POS

## 2014-11-27 LAB — HEMOGLOBIN AND HEMATOCRIT, BLOOD
HEMATOCRIT: 36 % — AB (ref 39.0–52.0)
Hemoglobin: 11.8 g/dL — ABNORMAL LOW (ref 13.0–17.0)

## 2014-11-27 LAB — I-STAT CG4 LACTIC ACID, ED: Lactic Acid, Venous: 1.05 mmol/L (ref 0.5–2.0)

## 2014-11-27 MED ORDER — PANTOPRAZOLE SODIUM 40 MG IV SOLR
40.0000 mg | INTRAVENOUS | Status: DC
  Administered 2014-11-27 – 2014-12-02 (×5): 40 mg via INTRAVENOUS
  Filled 2014-11-27 (×7): qty 40

## 2014-11-27 MED ORDER — ALUM & MAG HYDROXIDE-SIMETH 200-200-20 MG/5ML PO SUSP: 30.0000 mL | Freq: Four times a day (QID) | ORAL | Status: DC | PRN

## 2014-11-27 MED ORDER — FINASTERIDE 5 MG PO TABS
5.0000 mg | ORAL_TABLET | Freq: Every day | ORAL | Status: DC
  Administered 2014-11-27 – 2014-12-02 (×6): 5 mg via ORAL
  Filled 2014-11-27 (×6): qty 1

## 2014-11-27 MED ORDER — DONEPEZIL HCL 5 MG PO TABS
5.0000 mg | ORAL_TABLET | Freq: Every day | ORAL | Status: DC
  Administered 2014-11-27 – 2014-12-01 (×5): 5 mg via ORAL
  Filled 2014-11-27 (×6): qty 1

## 2014-11-27 MED ORDER — ONDANSETRON HCL 4 MG/2ML IJ SOLN: 4.0000 mg | Freq: Four times a day (QID) | INTRAMUSCULAR | Status: DC | PRN

## 2014-11-27 MED ORDER — MIRTAZAPINE 7.5 MG PO TABS
7.5000 mg | ORAL_TABLET | Freq: Every day | ORAL | Status: DC
  Administered 2014-11-27 – 2014-12-01 (×5): 7.5 mg via ORAL
  Filled 2014-11-27 (×6): qty 1

## 2014-11-27 MED ORDER — LORAZEPAM 2 MG/ML IJ SOLN
1.0000 mg | Freq: Once | INTRAMUSCULAR | Status: AC
  Administered 2014-11-27: 1 mg via INTRAVENOUS
  Filled 2014-11-27: qty 1

## 2014-11-27 MED ORDER — SODIUM CHLORIDE 0.9 % IV SOLN
INTRAVENOUS | Status: DC
  Administered 2014-11-27: 16:00:00 via INTRAVENOUS
  Administered 2014-11-28: 75 mL/h via INTRAVENOUS
  Administered 2014-11-29: 06:00:00 via INTRAVENOUS

## 2014-11-27 MED ORDER — NIFEDIPINE ER 30 MG PO TB24
30.0000 mg | ORAL_TABLET | Freq: Every day | ORAL | Status: DC
  Administered 2014-11-27 – 2014-12-02 (×6): 30 mg via ORAL
  Filled 2014-11-27 (×7): qty 1

## 2014-11-27 MED ORDER — ACETAMINOPHEN 650 MG RE SUPP: 650.0000 mg | Freq: Four times a day (QID) | RECTAL | Status: DC | PRN

## 2014-11-27 MED ORDER — OXYCODONE HCL 5 MG PO TABS: 5.0000 mg | ORAL_TABLET | ORAL | Status: DC | PRN

## 2014-11-27 MED ORDER — ONDANSETRON HCL 4 MG PO TABS: 4.0000 mg | ORAL_TABLET | Freq: Four times a day (QID) | ORAL | Status: DC | PRN

## 2014-11-27 MED ORDER — ACETAMINOPHEN 325 MG PO TABS
650.0000 mg | ORAL_TABLET | Freq: Four times a day (QID) | ORAL | Status: DC | PRN
  Filled 2014-11-27: qty 2

## 2014-11-27 MED ORDER — SODIUM CHLORIDE 0.9 % IJ SOLN
3.0000 mL | Freq: Two times a day (BID) | INTRAMUSCULAR | Status: DC
  Administered 2014-11-27 – 2014-12-02 (×8): 3 mL via INTRAVENOUS

## 2014-11-27 MED ORDER — SODIUM CHLORIDE 0.9 % IV SOLN
1000.0000 mL | Freq: Once | INTRAVENOUS | Status: AC
  Administered 2014-11-27: 1000 mL via INTRAVENOUS

## 2014-11-27 MED ORDER — SODIUM CHLORIDE 0.9 % IV SOLN: INTRAVENOUS | Status: DC

## 2014-11-27 MED ORDER — HYDROMORPHONE HCL 1 MG/ML IJ SOLN: 0.5000 mg | INTRAMUSCULAR | Status: DC | PRN

## 2014-11-27 MED ORDER — SODIUM CHLORIDE 0.9 % IV SOLN
1000.0000 mL | INTRAVENOUS | Status: DC
  Administered 2014-11-27: 1000 mL via INTRAVENOUS

## 2014-11-27 NOTE — ED Notes (Signed)
Dr. Jenkins at bedside. 

## 2014-11-27 NOTE — ED Notes (Addendum)
Pt arrives via EMS from home with rectal bleeding since Monday, seen at [facility] and discharged. Bright red blood per EMS. A/ox2.

## 2014-11-27 NOTE — Progress Notes (Signed)
Pt seen and examined, 82/M with dementia [facility], re-admitted with Lower GI bleed Presumed diverticular, no further episodes today Hb now 11.8 from 12.4-12.9 last admission, monitor for now Continue supportive care, start clears Nuc med scan if doesn't resolve  Zannie CovePreetha Jalal Rauch, MD [phone]

## 2014-11-27 NOTE — ED Provider Notes (Signed)
CSN: [id number]     Arrival date & time 11/27/14  [id number] History  This chart was scribed for Dione Boozeavid Nikolina Simerson, MD by Freida Busmaniana Omoyeni, ED Scribe. This patient was seen in room D33C/D33C and the patient's care was started 3:31 AM.    Chief Complaint  Patient presents with  . Rectal Bleeding    The history is provided by the patient and medical records. No language interpreter was used.     HPI Comments:  Billy [name] is a 81 y.o. male brought in by ambulance who presents to the Emergency Department complaining of rectal bleeding . He reports multiple BMs last night with with bright red blood in his stool.  He reports associated mild dizziness. He denies back pain and abdominal pain.  No alleviating factors noted. Pt was discharged 2 days ago after admission for rectal bleeding.    Past Medical History  Diagnosis Date  . Hypertension   . Hyperlipidemia   . Alzheimer's disease   . HTN (hypertension) 11/21/2014  . Dementia 11/21/2014  . BPH (benign prostatic hyperplasia) 11/21/2014   History reviewed. No pertinent past surgical history. No family history on file. History  Substance Use Topics  . Smoking status: Former Games developermoker  . Smokeless tobacco: Not on file  . Alcohol Use: No    Review of Systems  Respiratory: Negative for shortness of breath.   Gastrointestinal: Positive for blood in stool. Negative for abdominal pain.  Musculoskeletal: Negative for back pain.  Neurological: Positive for dizziness.  All other systems reviewed and are negative.     Allergies  Review of patient's allergies indicates no known allergies.  Home Medications   Prior to Admission medications   Medication Sig Start Date End Date Taking? Authorizing Provider  donepezil (ARICEPT) 5 MG tablet Take 1 tablet by mouth at bedtime. 10/25/14   Historical Provider, MD  finasteride (PROSCAR) 5 MG tablet Take 1 tablet by mouth daily. 10/25/14   Historical Provider, MD  mirtazapine (REMERON) 7.5 MG tablet Take 1  tablet by mouth at bedtime. 11/02/14   Historical Provider, MD  NIFEdipine (PROCARDIA-XL/ADALAT-CC/NIFEDICAL-XL) 30 MG 24 hr tablet Take 30 mg by mouth daily. 10/25/14   Historical Provider, MD   BP 136/85 mmHg  Pulse 92  Temp(Src) 98.6 F (37 C) (Oral)  Resp 18  SpO2 99% Physical Exam  Constitutional: He is oriented to person, place, and time. He appears well-developed and well-nourished.     HENT:  Head: Normocephalic and atraumatic.  Eyes: Conjunctivae are normal. Pupils are equal, round, and reactive to light.  Neck: Normal range of motion. Neck supple. No JVD present.  Cardiovascular: Normal rate, regular rhythm and normal heart sounds.   No murmur heard. Pulmonary/Chest: Effort normal and breath sounds normal. He has no wheezes. He has no rales. He exhibits no tenderness.  Abdominal: Soft. He exhibits no distension and no mass. There is no tenderness.  Genitourinary: Guaiac positive stool.  Bright red blood present   Musculoskeletal: Normal range of motion. He exhibits no edema.  Lymphadenopathy:    He has no cervical adenopathy.  Neurological: He is alert and oriented to person, place, and time. No cranial nerve deficit. He exhibits normal muscle tone. Coordination normal.  Skin: Skin is warm and dry. No rash noted.  Psychiatric: He has a normal mood and affect. His behavior is normal.  Nursing note and vitals reviewed.   ED Course  Procedures   DIAGNOSTIC STUDIES:  Oxygen Saturation is 99% on RA, normal by my interpretation.  COORDINATION OF CARE:  3:35 AM Discussed treatment plan with pt at bedside and pt agreed to plan.  Labs Review Results for orders placed or performed during the hospital encounter of 11/27/14  Comprehensive metabolic panel  Result Value Ref Range   Sodium 139 135 - 145 mmol/L   Potassium 3.6 3.5 - 5.1 mmol/L   Chloride 102 101 - 111 mmol/L   CO2 27 22 - 32 mmol/L   Glucose, Bld 92 70 - 99 mg/dL   BUN 23 (H) 6 - 20 mg/dL   Creatinine,  Ser 1.611.56 (H) 0.61 - 1.24 mg/dL   Calcium 9.0 8.9 - 09.610.3 mg/dL   Total Protein 7.0 6.5 - 8.1 g/dL   Albumin 2.8 (L) 3.5 - 5.0 g/dL   AST 26 15 - 41 U/L   ALT 20 17 - 63 U/L   Alkaline Phosphatase 48 38 - 126 U/L   Total Bilirubin 0.6 0.3 - 1.2 mg/dL   GFR calc non Af Amer 40 (L) >60 mL/min   GFR calc Af Amer 47 (L) >60 mL/min   Anion gap 10 5 - 15  CBC with Differential  Result Value Ref Range   WBC 12.9 (H) 4.0 - 10.5 K/uL   RBC 4.10 (L) 4.22 - 5.81 MIL/uL   Hemoglobin 11.8 (L) 13.0 - 17.0 g/dL   HCT 04.536.0 (L) 40.939.0 - 81.152.0 %   MCV 87.8 78.0 - 100.0 fL   MCH 28.8 26.0 - 34.0 pg   MCHC 32.8 30.0 - 36.0 g/dL   RDW 91.414.4 78.211.5 - 95.615.5 %   Platelets 486 (H) 150 - 400 K/uL   Neutrophils Relative % 46 43 - 77 %   Lymphocytes Relative 21 12 - 46 %   Monocytes Relative 22 (H) 3 - 12 %   Eosinophils Relative 11 (H) 0 - 5 %   Basophils Relative 0 0 - 1 %   Neutro Abs 6.0 1.7 - 7.7 K/uL   Lymphs Abs 2.7 0.7 - 4.0 K/uL   Monocytes Absolute 2.8 (H) 0.1 - 1.0 K/uL   Eosinophils Absolute 1.4 (H) 0.0 - 0.7 K/uL   Basophils Absolute 0.0 0.0 - 0.1 K/uL   WBC Morphology ATYPICAL LYMPHOCYTES   POC occult blood, ED RN will collect  Result Value Ref Range   Fecal Occult Bld POSITIVE (A) NEGATIVE  I-Stat CG4 Lactic Acid, ED  Result Value Ref Range   Lactic Acid, Venous 1.05 0.5 - 2.0 mmol/L  Type and screen for Red Blood Exchange  Result Value Ref Range   ABO/RH(D) A POS    Antibody Screen NEG    Sample Expiration 11/30/2014   ABO/Rh  Result Value Ref Range   ABO/RH(D) A POS      MDM   Final diagnoses:  Rectal bleeding  Acute kidney injury (nontraumatic)    Recurrent rectal bleeding. Hemoglobin has dropped somewhat from recent discharge but lactic acid level is normal. Case is discussed with Dr. Lovell SheehanJenkins of [facility] who agrees to admit the patient.  I personally performed the services described in this documentation, which was scribed in my presence. The recorded information  has been reviewed and is accurate.     Dione Boozeavid Humbert Morozov, MD 11/27/14 [phone]

## 2014-11-27 NOTE — H&P (Signed)
[facility] Admission History and Physical       [name] ZOX:[id number] DOB: 03-28-1932 DOA: 11/27/2014  Referring physician: EDP PCP: [name], MD  Specialists:   Chief Complaint: Rectal Bleeding  HPI: Billy [name] is a 81 y.o. male with HTN, Hyperlipidemia, BPH, and Dementia who presents to the ED with complaints of rectal bleeding that has worsened over the past 2 days.   He denies any ABD Pain or Fever or Nausea or Vomiting.   He was discharged from the hospitl 2 days ago for Rectal bleeding which was presumed to have been from Diverticular Bleeding, and was treated conservatively.  His hemoglobin on discharge was 13.3 and tonight his hemoglobin is 11.8.  He reports that he is passing large clots and has Dizziness, Lightheadedness and SOB along with weakness and fatigue.   An FOBT was performed in the ED which was HEME+.     Review of Systems:  Constitutional: No Weight Loss, No Weight Gain, Night Sweats, Fevers, Chills, +Dizziness, Light Headedness, Fatigue, or Generalized Weakness HEENT: No Headaches, Difficulty Swallowing,Tooth/Dental Problems,Sore Throat,  No Sneezing, Rhinitis, Ear Ache, Nasal Congestion, or Post Nasal Drip,  Cardio-vascular:  No Chest pain, Orthopnea, PND, Edema in Lower Extremities, Anasarca, Dizziness, Palpitations  Resp: No +Dyspnea, No DOE, No Productive Cough, No Non-Productive Cough, No Hemoptysis, No Wheezing.    GI: No Heartburn, Indigestion, Abdominal Pain, Nausea, Vomiting, Diarrhea, Constipation, Hematemesis, +Hematochezia, Melena, Change in Bowel Habits,  Loss of Appetite  GU: No Dysuria, No Change in Color of Urine, No Urgency or Urinary Frequency, No Flank pain.  Musculoskeletal: No Joint Pain or Swelling, No Decreased Range of Motion, No Back Pain.  Neurologic: No Syncope, No Seizures, Muscle Weakness, Paresthesia, Vision Disturbance or Loss, No Diplopia, No Vertigo, No Difficulty Walking,  Skin: No Rash or  Lesions. Psych: No Change in Mood or Affect, No Depression or Anxiety, No Memory loss, No Confusion, or Hallucinations   Past Medical History  Diagnosis Date  . Hypertension   . Hyperlipidemia   . Alzheimer's disease   . HTN (hypertension) 11/21/2014  . Dementia 11/21/2014  . BPH (benign prostatic hyperplasia) 11/21/2014     History reviewed. No pertinent past surgical history.    Prior to Admission medications   Medication Sig Start Date End Date Taking? Authorizing Provider  donepezil (ARICEPT) 5 MG tablet Take 1 tablet by mouth at bedtime. 10/25/14  Yes Historical Provider, MD  finasteride (PROSCAR) 5 MG tablet Take 1 tablet by mouth daily. 10/25/14  Yes Historical Provider, MD  mirtazapine (REMERON) 7.5 MG tablet Take 1 tablet by mouth at bedtime. 11/02/14  Yes Historical Provider, MD  NIFEdipine (PROCARDIA-XL/ADALAT-CC/NIFEDICAL-XL) 30 MG 24 hr tablet Take 30 mg by mouth daily. 10/25/14  Yes Historical Provider, MD     No Known Allergies     Social History:  reports that he has quit smoking. He does not have any smokeless tobacco history on file. He reports that he does not drink alcohol or use illicit drugs.     No family history on file.     Physical Exam:  GEN:  Pleasant Elderly Thin 81 y.o. African American male examined and in no acute distress; cooperative with exam Filed Vitals:   11/27/14 0345 11/27/14 0415 11/27/14 0445 11/27/14 0500  BP: 148/89 144/115 145/113 138/85  Pulse: 101 104 122 98  Temp:      TempSrc:      Resp: SpO2: 98% 99% 96% 99%   Blood  pressure 138/85, pulse 98, temperature 98.6 F (37 C), temperature source Oral, resp. rate 19, SpO2 99 %. PSYCH: He is alert and oriented x4; does not appear anxious does not appear depressed; affect is normal HEENT: Normocephalic and Atraumatic, Mucous membranes pink; PERRLA; EOM intact; Fundi:  Benign;  No scleral icterus, Nares: Patent, Oropharynx: Clear,    Neck:  FROM, No Cervical Lymphadenopathy  nor Thyromegaly or Carotid Bruit; No JVD; Breasts:: Not examined CHEST WALL: No tenderness CHEST: Normal respiration, clear to auscultation bilaterally HEART: Regular rate and rhythm; no murmurs rubs or gallops BACK: No kyphosis or scoliosis; No CVA tenderness ABDOMEN: Positive Bowel Sounds, Scaphoid, Soft Non-Tender, No Rebound or Guarding; No Masses, No Organomegaly Rectal Exam: Not done EXTREMITIES: No Cyanosis, Clubbing, or Edema; No Ulcerations. Genitalia: not examined PULSES: 2+ and symmetric SKIN: Normal hydration no rash or ulceration CNS:  Alert and Oriented x 4, No Focal Deficits Vascular: pulses palpable throughout    Labs on Admission:  Basic Metabolic Panel:  Recent Labs Lab 11/21/14 1419 11/21/14 2145 11/22/14 0315 11/23/14 0445 11/27/14 0334  NA 139  --  135 141 139  K 3.8  --  3.6 3.7 3.6  CL 107  --  106 107 102  CO2 26  --  24 24 27   GLUCOSE 89  --  95 90 92  BUN 9  --  5* 6 23*  CREATININE 0.97  --  0.83 0.76 1.56*  CALCIUM 8.7*  --  8.4* 9.0 9.0  MG  --  2.1  --   --   --    Liver Function Tests:  Recent Labs Lab 11/21/14 1419 11/27/14 0334  AST 19 26  ALT 11* 20  ALKPHOS 59 48  BILITOT 0.7 0.6  PROT 7.6 7.0  ALBUMIN 3.4* 2.8*    Recent Labs Lab 11/21/14 1419  LIPASE 22   No results for input(s): AMMONIA in the last 168 hours. CBC:  Recent Labs Lab 11/22/14 0315 11/22/14 1128 11/23/14 0445 11/24/14 1000 11/27/14 0334  WBC 8.7 9.8 8.0 9.5 12.9*  NEUTROABS  --   --   --   --  6.0  HGB 12.9* 12.6* 12.9* 13.3 11.8*  HCT 39.8 40.3 39.7 40.8 36.0*  MCV 88.1 88.8 87.6 87.9 87.8  PLT 498* 507* 527* 562* 486*   Cardiac Enzymes: No results for input(s): CKTOTAL, CKMB, CKMBINDEX, TROPONINI in the last 168 hours.  BNP (last 3 results) No results for input(s): BNP in the last 8760 hours.  ProBNP (last 3 results) No results for input(s): PROBNP in the last 8760 hours.  CBG: No results for input(s): GLUCAP in the last 168  hours.  Radiological Exams on Admission: No results found.      Assessment/Plan:   81 y.o. male with  Principal Problem:   1.    Rectal bleeding   Telemetry Monitoring   Monitor H/Hs   Type and Screen sent   Obtain GI consult in AM      Active Problems:   2.    Diverticulosis   On last CT scan      3.    Anemia- Due to #1   Monitor H/Hs   Transfuse PRN     4.    HTN (hypertension)   Continue     5.    Dementia/ Alzheimer's disease   Continue Aricept Rx     6.   DVT Prophylaxis   SCDs  Code Status:     DO NOT RESUSCITATE (DNR)      Family Communication:   Family at Bedside      Disposition Plan:    Inpatient Status        Time spent:  7460 Minutes      Ron ParkerJENKINS,Solash Tullo C [facility] Pager [phone]   If 7AM -7PM Please Contact the Day Rounding Team MD for [facility]  If 7PM-7AM, Please Contact Night-Floor Coverage  [email or web address] Password [email or web address] 11/27/2014, 5:53 AM     ADDENDUM:   Patient was seen and examined on 11/27/2014

## 2014-11-27 NOTE — Progress Notes (Signed)
Report received from Charlett BlakeEmilie Olsen, RN

## 2014-11-28 ENCOUNTER — Inpatient Hospital Stay (HOSPITAL_COMMUNITY): Payer: Medicare Other

## 2014-11-28 LAB — CBC
HEMATOCRIT: 37 % — AB (ref 39.0–52.0)
HEMATOCRIT: 39.6 % (ref 39.0–52.0)
Hemoglobin: 12 g/dL — ABNORMAL LOW (ref 13.0–17.0)
Hemoglobin: 12.9 g/dL — ABNORMAL LOW (ref 13.0–17.0)
MCH: 28.3 pg (ref 26.0–34.0)
MCH: 28.6 pg (ref 26.0–34.0)
MCHC: 32.4 g/dL (ref 30.0–36.0)
MCHC: 32.6 g/dL (ref 30.0–36.0)
MCV: 87.3 fL (ref 78.0–100.0)
MCV: 87.8 fL (ref 78.0–100.0)
Platelets: 495 10*3/uL — ABNORMAL HIGH (ref 150–400)
Platelets: 498 10*3/uL — ABNORMAL HIGH (ref 150–400)
RBC: 4.24 MIL/uL (ref 4.22–5.81)
RBC: 4.51 MIL/uL (ref 4.22–5.81)
RDW: 14.6 % (ref 11.5–15.5)
RDW: 14.3 % (ref 11.5–15.5)
WBC: 11.6 10*3/uL — AB (ref 4.0–10.5)
WBC: 11.8 10*3/uL — ABNORMAL HIGH (ref 4.0–10.5)

## 2014-11-28 LAB — BASIC METABOLIC PANEL
ANION GAP: 10 (ref 5–15)
BUN: 11 mg/dL (ref 6–20)
CALCIUM: 8.5 mg/dL — AB (ref 8.9–10.3)
CO2: 23 mmol/L (ref 22–32)
Chloride: 107 mmol/L (ref 101–111)
Creatinine, Ser: 0.97 mg/dL (ref 0.61–1.24)
GFR calc Af Amer: >60 mL/min (ref >60)
Glucose, Bld: 65 mg/dL — ABNORMAL LOW (ref 70–99)
Potassium: 3.4 mmol/L — ABNORMAL LOW (ref 3.5–5.1)
SODIUM: 140 mmol/L (ref 135–145)

## 2014-11-28 MED ORDER — TECHNETIUM TC 99M-LABELED RED BLOOD CELLS IV KIT
30.0000 | PACK | Freq: Once | INTRAVENOUS | Status: AC | PRN
  Administered 2014-11-28: 30 via INTRAVENOUS

## 2014-11-28 MED ORDER — LORAZEPAM 2 MG/ML IJ SOLN
1.0000 mg | Freq: Once | INTRAMUSCULAR | Status: AC
  Administered 2014-11-28: 1 mg via INTRAVENOUS
  Filled 2014-11-28: qty 1

## 2014-11-28 NOTE — Progress Notes (Signed)
[facility] PROGRESS NOTE  Billy KiefSylvester [name] ZOX:[id number] DOB: 1931/12/25 DOA: 11/27/2014 PCP: Billy HousekeeperHUSAIN,Billy [name]  Assessment/Plan: 1. Lower Gi bleed, painless, presumed diverticular,  -pandiverticulosis on Colonoscopy 2010 -Hb stable but still with scant intermittent bleeding -if profuse bleeding recurs will check Tagged RBC scan -follow clinically and Hb -[facility]  2. Dementia -stable, on aricept and remeron for sleep  3. Severe malnutrition -supplement diet when possible  4. AKI -due to poor Po intake, dehydration -resolved  5. HTN -on nifedipine, stable  DVT proph: SCDs  Code Status: Full Code Family Communication: none at bedside (indicate person spoken with, relationship, and if by phone, the number) Disposition Plan: home when stable   HPI/Subjective: Small amount of blood this am  Objective: Filed Vitals:   11/28/14 0453  BP: 119/99  Pulse: 105  Temp: 98.4 F (36.9 C)  Resp: 22    Intake/Output Summary (Last 24 hours) at 11/28/14 1148 Last data filed at 11/28/14 0600  Gross per 24 hour  Intake   1210 ml  Output    701 ml  Net    509 ml   Filed Weights   11/27/14 0612 11/27/14 2018  Weight: 59.693 kg (131 lb 9.6 oz) 59.467 kg (131 lb 1.6 oz)    Exam:   General:  plesantly confused  Cardiovascular: S1S2/RRR  Respiratory: CTAB  Abdomen: soft, NT, BS present  Musculoskeletal: no edema c/c   Data Reviewed: Basic Metabolic Panel:  Recent Labs Lab 11/21/14 1419 11/21/14 2145 11/22/14 0315 11/23/14 0445 11/27/14 0334 11/28/14 0458  NA 139  --  135 141 139 140  K 3.8  --  3.6 3.7 3.6 3.4*  CL 107  --  106 107 102 107  CO2 26  --  24 24 27 23   GLUCOSE 89  --  95 90 92 65*  BUN 9  --  5* 6 23* 11  CREATININE 0.97  --  0.83 0.76 1.56* 0.97  CALCIUM 8.7*  --  8.4* 9.0 9.0 8.5*  MG  --  2.1  --   --   --   --    Liver Function Tests:  Recent Labs Lab 11/21/14 1419 11/27/14 0334  AST 19 26   ALT 11* 20  ALKPHOS 59 48  BILITOT 0.7 0.6  PROT 7.6 7.0  ALBUMIN 3.4* 2.8*    Recent Labs Lab 11/21/14 1419  LIPASE 22   No results for input(s): AMMONIA in the last 168 hours. CBC:  Recent Labs Lab 11/22/14 1128 11/23/14 0445 11/24/14 1000 11/27/14 0334 11/27/14 0805 11/28/14 0458  WBC 9.8 8.0 9.5 12.9*  --  11.6*  NEUTROABS  --   --   --  6.0  --   --   HGB 12.6* 12.9* 13.3 11.8* 11.8* 12.0*  HCT 40.3 39.7 40.8 36.0* 36.0* 37.0*  MCV 88.8 87.6 87.9 87.8  --  87.3  PLT 507* 527* 562* 486*  --  495*   Cardiac Enzymes: No results for input(s): CKTOTAL, CKMB, CKMBINDEX, TROPONINI in the last 168 hours. BNP (last 3 results) No results for input(s): BNP in the last 8760 hours.  ProBNP (last 3 results) No results for input(s): PROBNP in the last 8760 hours.  CBG: No results for input(s): GLUCAP in the last 168 hours.  Recent Results (from the past 240 hour(s))  Urine culture     Status: None   Collection Time: 11/21/14  9:10 PM  Result Value Ref Range Status  Specimen Description URINE, RANDOM  Final   Special Requests RANDOM  Final   Colony Count   Final    15,000 COLONIES/ML Performed at [facility]    Culture   Final    Multiple bacterial morphotypes present, none predominant. Suggest appropriate recollection if clinically indicated. Performed at [facility]    Report Status 11/23/2014 FINAL  Final     Studies: No results found.  Scheduled Meds: . donepezil  5 mg Oral QHS  . finasteride  5 mg Oral Daily  . mirtazapine  7.5 mg Oral QHS  . NIFEdipine  30 mg Oral Daily  . pantoprazole (PROTONIX) IV  40 mg Intravenous Q24H  . sodium chloride  3 mL Intravenous Q12H   Continuous Infusions: . sodium chloride Stopped (11/28/14 0756)   Antibiotics Given (last 72 hours)    None      Principal Problem:   Rectal bleeding Active Problems:   Diverticulosis   HTN (hypertension)   Dementia   Anemia   Alzheimer's  disease    Time spent: 35min    [facility] Surgery CenterJOSEPH,Billy Lazenby  [facility] Pager [phone]. If 7PM-7AM, please contact night-coverage at [email or web address], password [email or web address] 11/28/2014, 11:48 AM  LOS: 1 day

## 2014-11-29 LAB — CBC
HEMATOCRIT: 40.1 % (ref 39.0–52.0)
HEMATOCRIT: 38.7 % — AB (ref 39.0–52.0)
HEMOGLOBIN: 13.2 g/dL (ref 13.0–17.0)
Hemoglobin: 12.8 g/dL — ABNORMAL LOW (ref 13.0–17.0)
MCH: 28.5 pg (ref 26.0–34.0)
MCH: 28.6 pg (ref 26.0–34.0)
MCHC: 32.9 g/dL (ref 30.0–36.0)
MCHC: 33.1 g/dL (ref 30.0–36.0)
MCV: 86.6 fL (ref 78.0–100.0)
MCV: 86.4 fL (ref 78.0–100.0)
Platelets: 513 10*3/uL — ABNORMAL HIGH (ref 150–400)
Platelets: 549 10*3/uL — ABNORMAL HIGH (ref 150–400)
RBC: 4.63 MIL/uL (ref 4.22–5.81)
RBC: 4.48 MIL/uL (ref 4.22–5.81)
RDW: 14.3 % (ref 11.5–15.5)
RDW: 14.1 % (ref 11.5–15.5)
WBC: 10.5 10*3/uL (ref 4.0–10.5)
WBC: 11.3 10*3/uL — ABNORMAL HIGH (ref 4.0–10.5)

## 2014-11-29 LAB — BASIC METABOLIC PANEL
Anion gap: 11 (ref 5–15)
BUN: 8 mg/dL (ref 6–20)
CALCIUM: 8.9 mg/dL (ref 8.9–10.3)
CO2: 22 mmol/L (ref 22–32)
CREATININE: 0.96 mg/dL (ref 0.61–1.24)
Chloride: 106 mmol/L (ref 101–111)
GFR calc Af Amer: >60 mL/min (ref >60)
GFR calc non Af Amer: >60 mL/min (ref >60)
Glucose, Bld: 78 mg/dL (ref 70–99)
Potassium: 3.5 mmol/L (ref 3.5–5.1)
Sodium: 139 mmol/L (ref 135–145)

## 2014-11-29 NOTE — Progress Notes (Signed)
BM x 1. Quarter size amount of dark blood noted in stool @1830 .

## 2014-11-29 NOTE — Progress Notes (Addendum)
[facility] PROGRESS NOTE  Billy KiefSylvester [name] ZOX:[id number] DOB: March 05, 1932 DOA: 11/27/2014 PCP: Georgann HousekeeperHUSAIN,KARRAR, MD   Narrative: Billy KiefSylvester [name] is a 81 y.o. male with Dementia, HTN, Hyperlipidemia, BPH,  presented to the ED with complaints of rectal bleeding.  [facility] for Rectal bleeding which was presumed to have been from Diverticular Bleeding, and was treated conservatively, seen by GI felt to be diverticular,his hemoglobin on discharge was 13.   Assessment/Plan: 1. Lower Gi bleed, painless, presumed diverticular,  -pandiverticulosis on Colonoscopy 2010 and 2004 -Hb stable but still with scant intermittent bleeding, bleeding improving, now with streaks in stools only -Tagged RBC scan yesterday was normal this was done due to bleeding yesterday am -follow clinically and Hb -[facility] -home in 1-2days once bleeding resolved -discussed with niece in detail via telephone -if profuse Bleeding recurs will need to repeat Tagged RBC scan  2. Dementia -stable, on aricept and remeron for sleep  3. Severe malnutrition -supplement diet when possible  4. AKI -due to poor Po intake, dehydration -resolved  5. HTN -on nifedipine, stable  DVT proph: SCDs  Code Status: Full Code Family Communication: none at bedside, called and d/w niece via telephone Disposition Plan: home when stable   HPI/Subjective: Scant streaks of blood noted in stool this am, no further bleeding overnight  Objective: Filed Vitals:   11/29/14 0901  BP: 140/93  Pulse: 108  Temp: 98.5 F (36.9 C)  Resp: 18    Intake/Output Summary (Last 24 hours) at 11/29/14 1249 Last data filed at 11/29/14 1030  Gross per 24 hour  Intake 2161.75 ml  Output    208 ml  Net 1953.75 ml   Filed Weights   11/27/14 0612 11/27/14 2018  Weight: 59.693 kg (131 lb 9.6 oz) 59.467 kg (131 lb 1.6 oz)    Exam:   General:  plesantly  confused  Cardiovascular: S1S2/RRR  Respiratory: CTAB  Abdomen: soft, NT, BS present  Musculoskeletal: no edema c/c   Data Reviewed: Basic Metabolic Panel:  Recent Labs Lab 11/23/14 0445 11/27/14 0334 11/28/14 0458 11/29/14 0545  NA 141 139 140 139  K 3.7 3.6 3.4* 3.5  CL 107 102 107 106  CO2 24 27 23 22   GLUCOSE 90 92 65* 78  BUN 6 23* 11 8  CREATININE 0.76 1.56* 0.97 0.96  CALCIUM 9.0 9.0 8.5* 8.9   Liver Function Tests:  Recent Labs Lab 11/27/14 0334  AST 26  ALT 20  ALKPHOS 48  BILITOT 0.6  PROT 7.0  ALBUMIN 2.8*   No results for input(s): LIPASE, AMYLASE in the last 168 hours. No results for input(s): AMMONIA in the last 168 hours. CBC:  Recent Labs Lab 11/24/14 1000 11/27/14 0334 11/27/14 0805 11/28/14 0458 11/28/14 1725 11/29/14 0545  WBC 9.5 12.9*  --  11.6* 11.8* 10.5  NEUTROABS  --  6.0  --   --   --   --   HGB 13.3 11.8* 11.8* 12.0* 12.9* 13.2  HCT 40.8 36.0* 36.0* 37.0* 39.6 40.1  MCV 87.9 87.8  --  87.3 87.8 86.6  PLT 562* 486*  --  495* 498* 513*   Cardiac Enzymes: No results for input(s): CKTOTAL, CKMB, CKMBINDEX, TROPONINI in the last 168 hours. BNP (last 3 results) No results for input(s): BNP in the last 8760 hours.  ProBNP (last 3 results) No results for input(s): PROBNP in the last 8760 hours.  CBG: No results for  input(s): GLUCAP in the last 168 hours.  Recent Results (from the past 240 hour(s))  Urine culture     Status: None   Collection Time: 11/21/14  9:10 PM  Result Value Ref Range Status   Specimen Description URINE, RANDOM  Final   Special Requests RANDOM  Final   Colony Count   Final    15,000 COLONIES/ML Performed at [facility]    Culture   Final    Multiple bacterial morphotypes present, none predominant. Suggest appropriate recollection if clinically indicated. Performed at [facility]    Report Status 11/23/2014 FINAL  Final     Studies: Nm Gi Blood Loss  11/28/2014   CLINICAL  DATA:  PT exam due to bloody stool, possible GI bleedPT's nurse explained to tech (AA) that has been incontinent and had passed stool early 11/28/14 with minor flecks of blood.PT Hx of HTN, HLD, Alzheimer's Disease, Dementia, BPH  EXAM: NUCLEAR MEDICINE GASTROINTESTINAL BLEEDING SCAN  TECHNIQUE: Sequential abdominal images were obtained following intravenous administration of Tc-4034m labeled red blood cells.  RADIOPHARMACEUTICALS:  30 mCi Tc-3034m in-vitro labeled red cells.  COMPARISON:  None.  FINDINGS: Study degraded by patient motion. Allowing for this, there is no evidence of a GI bleeding source. Normal uptake is seen in the liver.  IMPRESSION: No evidence of a GI bleeding source.   Electronically Signed   By: Amie Portlandavid  Ormond M.D.   On: 11/28/2014 16:29    Scheduled Meds: . donepezil  5 mg Oral QHS  . finasteride  5 mg Oral Daily  . mirtazapine  7.5 mg Oral QHS  . NIFEdipine  30 mg Oral Daily  . pantoprazole (PROTONIX) IV  40 mg Intravenous Q24H  . sodium chloride  3 mL Intravenous Q12H   Continuous Infusions:   Antibiotics Given (last 72 hours)    None      Principal Problem:   Rectal bleeding Active Problems:   Diverticulosis   HTN (hypertension)   Dementia   Anemia   Alzheimer's disease    Time spent: 35min    [facility] LLCJOSEPH,Santo Zahradnik  [facility] Pager [phone]. If 7PM-7AM, please contact night-coverage at [email or web address], password [email or web address] 11/29/2014, 12:49 PM  LOS: 2 days

## 2014-11-30 LAB — BASIC METABOLIC PANEL
ANION GAP: 10 (ref 5–15)
BUN: 8 mg/dL (ref 6–20)
CHLORIDE: 107 mmol/L (ref 101–111)
CO2: 22 mmol/L (ref 22–32)
Calcium: 8.4 mg/dL — ABNORMAL LOW (ref 8.9–10.3)
Creatinine, Ser: 0.99 mg/dL (ref 0.61–1.24)
GFR calc non Af Amer: >60 mL/min (ref >60)
Glucose, Bld: 75 mg/dL (ref 70–99)
POTASSIUM: 4.1 mmol/L (ref 3.5–5.1)
SODIUM: 139 mmol/L (ref 135–145)

## 2014-11-30 LAB — CBC WITH DIFFERENTIAL/PLATELET
Basophils Absolute: 0 10*3/uL (ref 0.0–0.1)
Basophils Relative: 0 % (ref 0–1)
EOS PCT: 19 % — AB (ref 0–5)
Eosinophils Absolute: 1.8 10*3/uL — ABNORMAL HIGH (ref 0.0–0.7)
HCT: 39.1 % (ref 39.0–52.0)
HEMOGLOBIN: 12.9 g/dL — AB (ref 13.0–17.0)
Lymphocytes Relative: 17 % (ref 12–46)
Lymphs Abs: 1.6 10*3/uL (ref 0.7–4.0)
MCH: 28.4 pg (ref 26.0–34.0)
MCHC: 33 g/dL (ref 30.0–36.0)
MCV: 85.9 fL (ref 78.0–100.0)
MONOS PCT: 14 % — AB (ref 3–12)
Monocytes Absolute: 1.3 10*3/uL — ABNORMAL HIGH (ref 0.1–1.0)
Neutro Abs: 4.8 10*3/uL (ref 1.7–7.7)
Neutrophils Relative %: 50 % (ref 43–77)
Platelets: 505 10*3/uL — ABNORMAL HIGH (ref 150–400)
RBC: 4.55 MIL/uL (ref 4.22–5.81)
RDW: 14.1 % (ref 11.5–15.5)
WBC: 9.5 10*3/uL (ref 4.0–10.5)

## 2014-11-30 LAB — CBC
HCT: 33.5 % — ABNORMAL LOW (ref 39.0–52.0)
Hemoglobin: 11 g/dL — ABNORMAL LOW (ref 13.0–17.0)
MCH: 28.1 pg (ref 26.0–34.0)
MCHC: 32.8 g/dL (ref 30.0–36.0)
MCV: 85.7 fL (ref 78.0–100.0)
Platelets: 494 10*3/uL — ABNORMAL HIGH (ref 150–400)
RBC: 3.91 MIL/uL — ABNORMAL LOW (ref 4.22–5.81)
RDW: 14.2 % (ref 11.5–15.5)
WBC: 9.4 10*3/uL (ref 4.0–10.5)

## 2014-11-30 NOTE — Care Management Note (Signed)
Case Management Note  Patient Details  Name: Billy [name] MRN: [id number] Date of Birth: 10/05/1931  Subjective/Objective:                CM following for progression and d/c planning.   Action/Plan: Spoke with pt   Expected Discharge Date:                  Expected Discharge Plan:  Home w Home Health Services  In-House Referral:  NA  Discharge planning Services  NA  Post Acute Care Choice:  Home Health Choice offered to:  Patient  DME Arranged:    DME Agency:     HH Arranged:    HH Agency:     Status of Service:     Medicare Important Message Given:  Yes Date Medicare IM Given:  11/30/14 Medicare IM give by:  Johny Shockheryl Nadra Hritz RN MPH, case manager Date Additional Medicare IM Given:    Additional Medicare Important Message give by:     If discussed at Long Length of Stay Meetings, dates discussed:    Additional Comments:  Billy [name], Billy Vollrath U, RN 11/30/2014, 1:44 PM

## 2014-11-30 NOTE — Care Management Note (Signed)
Case Management Note  Patient Details  Name: Arville Postlewaite MRN: [id number] Date of Birth: 09/02/1931  Subjective/Objective:                 CM following for progression and d/c planning.   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 She is not in the home but comes and goes, providing meals, shopping and transportation to MD appointments. Pt states that he is indep of ADLs and able to care for his lawn. This CM attempting to reach family to discuss d/c needs. Messages left with niece, Ms Melonie [place], await return call. Other phone number is incorrect.   Expected Discharge Date:                  Expected Discharge Plan:  [place]  In-House Referral:  NA  Discharge planning Services  NA  Post Acute Care Choice:  Home Health Choice offered to:  Patient  DME Arranged:    DME Agency:     HH Arranged:    [place] Agency:     Status of Service:     Medicare Important Message Given:  Yes Date Medicare IM Given:  11/30/14 Medicare IM give by:  Jasmine Pang RN MPH, case manager Date Additional Medicare IM Given:    Additional Medicare Important Message give by:     If discussed at [place] of Stay Meetings, dates discussed:    Additional Comments:  Adron Bene, RN 11/30/2014, 12:07 PM

## 2014-11-30 NOTE — Progress Notes (Signed)
[facility] PROGRESS NOTE  Jerald KiefSylvester Sibrian ZOX:[id number] DOB: 01/18/1932 DOA: 11/27/2014 PCP: Georgann HousekeeperHUSAIN,KARRAR, MD  81 y/o ? known dementia, Pan diverticulosis [colonoscopy 2004, 2010 polyps-non cancerous] and recent admit 5/2-11/25/14 with presumed diverticular bleed readmitted 11/27/14 with further bleeding He was found to be heme positive on 2 occasions since admission this time  Assessment/Plan: 1. Lower Gi bleed, painless, presumed diverticular,  -pandiverticulosis on Colonoscopy 2010 -Hb stable but still with scant intermittent bleeding -if profuse bleeding recurs will check Tagged RBC scan -[facility] -I spoke to Dr. Dulce Sellarutlaw On 5/611 who recommends conservative management and holding off on tagged RBC scan for now unless profuse bleed and watchful waiting.   2. Dementia -stable, on aricept and remeron for sleep  3. Severe malnutrition -supplement diet when possible  4. AKI -due to poor Po intake, dehydration -resolved after IV saline  5. HTN -on nifedipine, stable  DVT proph: SCDs  Code Status: Full Code Family Communication: Discussed with niece at the bedside 5/11 Disposition Plan: home when stable   HPI/Subjective:  multiple stools with blood today Bright red per nurse tech No n/v/  Objective: Filed Vitals:   11/30/14 1116  BP: 142/89  Pulse: 90  Temp: 97.5 F (36.4 C)  Resp: 21    Intake/Output Summary (Last 24 hours) at 11/30/14 1515 Last data filed at 11/30/14 0900  Gross per 24 hour  Intake  817.5 ml  Output     51 ml  Net  766.5 ml   Filed Weights   11/27/14 0612 11/27/14 2018  Weight: 59.693 kg (131 lb 9.6 oz) 59.467 kg (131 lb 1.6 oz)    Exam:   General:  plesantly confused  Cardiovascular: S1S2/RRR  Respiratory: CTAB  Abdomen: soft, NT, BS present  Musculoskeletal: no edema c/c   Data Reviewed: Basic Metabolic Panel:  Recent Labs Lab 11/27/14 0334 11/28/14 0458 11/29/14 0545 11/30/14 0423   NA 139 140 139 139  K 3.6 3.4* 3.5 4.1  CL 102 107 106 107  CO2 27 23 22 22   GLUCOSE 92 65* 78 75  BUN 23* 11 8 8   CREATININE 1.56* 0.97 0.96 0.99  CALCIUM 9.0 8.5* 8.9 8.4*   Liver Function Tests:  Recent Labs Lab 11/27/14 0334  AST 26  ALT 20  ALKPHOS 48  BILITOT 0.6  PROT 7.0  ALBUMIN 2.8*   No results for input(s): LIPASE, AMYLASE in the last 168 hours. No results for input(s): AMMONIA in the last 168 hours. CBC:  Recent Labs Lab 11/27/14 0334  11/28/14 0458 11/28/14 1725 11/29/14 0545 11/29/14 1712 11/30/14 0543  WBC 12.9*  --  11.6* 11.8* 10.5 11.3* 9.4  NEUTROABS 6.0  --   --   --   --   --   --   HGB 11.8*  < > 12.0* 12.9* 13.2 12.8* 11.0*  HCT 36.0*  < > 37.0* 39.6 40.1 38.7* 33.5*  MCV 87.8  --  87.3 87.8 86.6 86.4 85.7  PLT 486*  --  495* 498* 513* 549* 494*  < > = values in this interval not displayed. Cardiac Enzymes: No results for input(s): CKTOTAL, CKMB, CKMBINDEX, TROPONINI in the last 168 hours. BNP (last 3 results) No results for input(s): BNP in the last 8760 hours.  ProBNP (last 3 results) No results for input(s): PROBNP in the last 8760 hours.  CBG: No results for input(s): GLUCAP in the last 168 hours.  Recent Results (from the past 240 hour(s))  Urine culture     Status: None   Collection Time: 11/21/14  9:10 PM  Result Value Ref Range Status   Specimen Description URINE, RANDOM  Final   Special Requests RANDOM  Final   Colony Count   Final    15,000 COLONIES/ML Performed at [facility]    Culture   Final    Multiple bacterial morphotypes present, none predominant. Suggest appropriate recollection if clinically indicated. Performed at [facility]    Report Status 11/23/2014 FINAL  Final     Studies: Nm Gi Blood Loss  11/28/2014   CLINICAL DATA:  PT exam due to bloody stool, possible GI bleedPT's nurse explained to tech (AA) that has been incontinent and had passed stool early 11/28/14 with minor flecks of  blood.PT Hx of HTN, HLD, Alzheimer's Disease, Dementia, BPH  EXAM: NUCLEAR MEDICINE GASTROINTESTINAL BLEEDING SCAN  TECHNIQUE: Sequential abdominal images were obtained following intravenous administration of Tc-9043m labeled red blood cells.  RADIOPHARMACEUTICALS:  30 mCi Tc-8043m in-vitro labeled red cells.  COMPARISON:  None.  FINDINGS: Study degraded by patient motion. Allowing for this, there is no evidence of a GI bleeding source. Normal uptake is seen in the liver.  IMPRESSION: No evidence of a GI bleeding source.   Electronically Signed   By: Amie Portlandavid  Ormond M.D.   On: 11/28/2014 16:29    Scheduled Meds: . donepezil  5 mg Oral QHS  . finasteride  5 mg Oral Daily  . mirtazapine  7.5 mg Oral QHS  . NIFEdipine  30 mg Oral Daily  . pantoprazole (PROTONIX) IV  40 mg Intravenous Q24H  . sodium chloride  3 mL Intravenous Q12H   Continuous Infusions:   Antibiotics Given (last 72 hours)    None      Principal Problem:   Rectal bleeding Active Problems:   Diverticulosis   HTN (hypertension)   Dementia   Anemia   Alzheimer's disease    Time spent: 35min  Pleas KochJai Avianah Pellman, MD [facility] (P) [phone]

## 2014-11-30 NOTE — Evaluation (Signed)
Physical Therapy Evaluation Patient Details Name: Billy [name] MRN: [id number] DOB: April 13, 1932 Today's Date: 11/30/2014   History of Present Illness  Billy KiefSylvester Worland is a 81 y.o. male with HTN, Hyperlipidemia, BPH, and Dementia who presents to the ED with complaints of rectal bleeding that had worsened over 2 days PTA.  He denies any ABD Pain or Fever or Nausea or Vomiting. He was discharged from the hospitl 2 days PTA for Rectal bleeding which was presumed to have been from Diverticular Bleeding, and was treated conservatively  Clinical Impression  Pt admitted with above diagnosis. Pt currently with functional limitations due to the deficits listed below (see PT Problem List). Pt ambulated 300' with hand held assist.  Pt will benefit from skilled PT to increase their independence and safety with mobility to allow discharge to the venue listed below.      Follow Up Recommendations No PT follow up;Supervision for mobility/OOB    Equipment Recommendations  None recommended by PT    Recommendations for Other Services       Precautions / Restrictions Precautions Precautions: Fall Restrictions Weight Bearing Restrictions: No      Mobility  Bed Mobility               General bed mobility comments: up in chair  Transfers Overall transfer level: Needs assistance Equipment used: 1 person hand held assist Transfers: Sit to/from Stand Sit to Stand: Min guard         General transfer comment: Min guard supervision for safety   Ambulation/Gait Ambulation/Gait assistance: Min assist Ambulation Distance (Feet): 300 Feet Assistive device: 1 person hand held assist Gait Pattern/deviations: Step-through pattern;Narrow base of support   Gait velocity interpretation: at or above normal speed for age/gender General Gait Details: Pt ambulating; occasional mild LOB; swaying; VCs for staying in line from drifting.  Stairs            Wheelchair Mobility     Modified Rankin (Stroke Patients Only)       Balance             Standing balance-Leahy Scale: Fair                               Pertinent Vitals/Pain      Home Living Family/patient expects to be discharged to:: Private residence Living Arrangements: Other relatives Available Help at Discharge: Family;Available 24 hours/day Type of Home: House Home Access: Stairs to enter Entrance Stairs-Rails: Can reach both Entrance Stairs-Number of Steps: 3 Home Layout: One level Home Equipment: None      Prior Function Level of Independence: Needs assistance   Gait / Transfers Assistance Needed: S  ADL's / Homemaking Assistance Needed: set up/S  Comments: Family is with pt 24/7 and assists as needed with ADL      Hand Dominance   Dominant Hand: Right    Extremity/Trunk Assessment   Upper Extremity Assessment: Overall WFL for tasks assessed           Lower Extremity Assessment: Overall WFL for tasks assessed         Communication   Communication: No difficulties  Cognition Arousal/Alertness: Awake/alert Behavior During Therapy: WFL for tasks assessed/performed ([place] attempts to get OOB and stand up) Overall Cognitive Status: History of cognitive impairments - at baseline (h/o dementia)                      General Comments  Exercises        Assessment/Plan    PT Assessment Patient needs continued PT services  PT Diagnosis Difficulty walking   PT Problem List Decreased mobility;Decreased balance  PT Treatment Interventions DME instruction;Gait training;Stair training;Functional mobility training;Patient/family education;Therapeutic activities;Therapeutic exercise   PT Goals (Current goals can be found in the Care Plan section) Acute Rehab PT Goals Patient Stated Goal: gardening PT Goal Formulation: With patient Time For Goal Achievement: 12/14/14 Potential to Achieve Goals: Good    Frequency Min 3X/week    Barriers to discharge        Co-evaluation               End of Session Equipment Utilized During Treatment: Gait belt Activity Tolerance: Patient tolerated treatment well Patient left: in chair;with call bell/phone within reach;with nursing/sitter in room Nurse Communication: Mobility status         Time: 1155-1209 PT Time Calculation (min) (ACUTE ONLY): 14 min   Charges:   PT Evaluation $Initial PT Evaluation Tier I: 1 Procedure     PT G CodesTamala Ser:        Yukari Flax Kistler 11/30/2014, 12:29 PM [phone]

## 2014-12-01 MED ORDER — GLUCOSE 40 % PO GEL
ORAL | Status: AC
  Filled 2014-12-01: qty 1

## 2014-12-01 NOTE — Progress Notes (Signed)
[facility] PROGRESS NOTE  Billy [name] ZOX:[id number] DOB: January 27, 1932 DOA: 11/27/2014 PCP: Georgann HousekeeperHUSAIN,KARRAR, MD  81 y/o ? known dementia, Pan diverticulosis [colonoscopy 2004, 2010 polyps-non cancerous] and recent admit 5/2-11/25/14 with presumed diverticular bleed readmitted 11/27/14 with further bleeding He was found to be heme positive on 2 occasions since admission this time He was monitored overnight ex 2 and noted to have loose stools with small amounts of BRB mixed in with it  Assessment/Plan: 1. Lower Gi bleed, painless, presumed diverticular,  -pandiverticulosis on Colonoscopy 2010 -Hb stable but still with scant intermittent bleeding -if profuse bleeding recurs will check Tagged RBC scan -[facility] -I spoke to Dr. Dulce Sellarutlaw On 5/611 who recommends conservative management and holding off on tagged RBC scan for now unless profuse bleed and watchful waiting.  -Note that patient has hemorrhoid at 4:00 position  2. Dementia-stage 5-6 -stable, on aricept and remeron for sleep -Reorients well  3. Severe malnutrition -supplement diet when possible  4. AKI -due to poor Po intake, dehydration -resolved after IV saline  5. HTN -on nifedipine, stable  DVT proph: SCDs  Code Status: Full Code Family Communication: Discussed with sitter at the bedside 5/11 Disposition Plan: home when stable in 24 hours   HPI/Subjective:  Loose stool with small amount of blood in it.  Objective: Filed Vitals:   12/01/14 0430  BP: 103/71  Pulse: 95  Temp: 98.9 F (37.2 C)  Resp: 16    Intake/Output Summary (Last 24 hours) at 12/01/14 1136 Last data filed at 12/01/14 0300  Gross per 24 hour  Intake    240 ml  Output     51 ml  Net    189 ml   Filed Weights   11/27/14 0612 11/27/14 2018  Weight: 59.693 kg (131 lb 9.6 oz) 59.467 kg (131 lb 1.6 oz)    Exam:   General:  plesantly confused  Cardiovascular: S1S2/RRR  Respiratory: CTAB  Abdomen:  soft, NT, BS present-hemorrhoid noted 4 oclock position  Musculoskeletal: no edema c/c   Data Reviewed: Basic Metabolic Panel:  Recent Labs Lab 11/27/14 0334 11/28/14 0458 11/29/14 0545 11/30/14 0423  NA 139 140 139 139  K 3.6 3.4* 3.5 4.1  CL 102 107 106 107  CO2 27 23 22 22   GLUCOSE 92 65* 78 75  BUN 23* 11 8 8   CREATININE 1.56* 0.97 0.96 0.99  CALCIUM 9.0 8.5* 8.9 8.4*   Liver Function Tests:  Recent Labs Lab 11/27/14 0334  AST 26  ALT 20  ALKPHOS 48  BILITOT 0.6  PROT 7.0  ALBUMIN 2.8*   No results for input(s): LIPASE, AMYLASE in the last 168 hours. No results for input(s): AMMONIA in the last 168 hours. CBC:  Recent Labs Lab 11/27/14 0334  11/28/14 1725 11/29/14 0545 11/29/14 1712 11/30/14 0543 11/30/14 1701  WBC 12.9*  < > 11.8* 10.5 11.3* 9.4 9.5  NEUTROABS 6.0  --   --   --   --   --  4.8  HGB 11.8*  < > 12.9* 13.2 12.8* 11.0* 12.9*  HCT 36.0*  < > 39.6 40.1 38.7* 33.5* 39.1  MCV 87.8  < > 87.8 86.6 86.4 85.7 85.9  PLT 486*  < > 498* 513* 549* 494* 505*  < > = values in this interval not displayed. Cardiac Enzymes: No results for input(s): CKTOTAL, CKMB, CKMBINDEX, TROPONINI in the last 168 hours. BNP (last 3 results) No results for input(s): BNP in the  last 8760 hours.  ProBNP (last 3 results) No results for input(s): PROBNP in the last 8760 hours.  CBG: No results for input(s): GLUCAP in the last 168 hours.  Recent Results (from the past 240 hour(s))  Urine culture     Status: None   Collection Time: 11/21/14  9:10 PM  Result Value Ref Range Status   Specimen Description URINE, RANDOM  Final   Special Requests RANDOM  Final   Colony Count   Final    15,000 COLONIES/ML Performed at [facility]    Culture   Final    Multiple bacterial morphotypes present, none predominant. Suggest appropriate recollection if clinically indicated. Performed at [facility]    Report Status 11/23/2014 FINAL  Final      Studies: No results found.  Scheduled Meds: . donepezil  5 mg Oral QHS  . finasteride  5 mg Oral Daily  . mirtazapine  7.5 mg Oral QHS  . NIFEdipine  30 mg Oral Daily  . pantoprazole (PROTONIX) IV  40 mg Intravenous Q24H  . sodium chloride  3 mL Intravenous Q12H   Continuous Infusions:   Antibiotics Given (last 72 hours)    None      Principal Problem:   Rectal bleeding Active Problems:   Diverticulosis   HTN (hypertension)   Dementia   Anemia   Alzheimer's disease    Time spent: 15min  Pleas KochJai Terin Dierolf, MD [facility] (P) [phone]

## 2014-12-02 LAB — CBC WITH DIFFERENTIAL/PLATELET
Basophils Absolute: 0 10*3/uL (ref 0.0–0.1)
Basophils Relative: 0 % (ref 0–1)
EOS PCT: 20 % — AB (ref 0–5)
Eosinophils Absolute: 2.2 10*3/uL — ABNORMAL HIGH (ref 0.0–0.7)
HEMATOCRIT: 36.8 % — AB (ref 39.0–52.0)
Hemoglobin: 12 g/dL — ABNORMAL LOW (ref 13.0–17.0)
Lymphocytes Relative: 20 % (ref 12–46)
Lymphs Abs: 2.2 10*3/uL (ref 0.7–4.0)
MCH: 27.8 pg (ref 26.0–34.0)
MCHC: 32.6 g/dL (ref 30.0–36.0)
MCV: 85.4 fL (ref 78.0–100.0)
MONO ABS: 2 10*3/uL — AB (ref 0.1–1.0)
MONOS PCT: 19 % — AB (ref 3–12)
Neutro Abs: 4.4 10*3/uL (ref 1.7–7.7)
Neutrophils Relative %: 41 % — ABNORMAL LOW (ref 43–77)
PLATELETS: 521 10*3/uL — AB (ref 150–400)
RBC: 4.31 MIL/uL (ref 4.22–5.81)
RDW: 13.9 % (ref 11.5–15.5)
WBC: 10.8 10*3/uL — AB (ref 4.0–10.5)

## 2014-12-02 MED ORDER — DOCUSATE SODIUM 100 MG PO CAPS: 100.0000 mg | ORAL_CAPSULE | Freq: Two times a day (BID) | ORAL | Status: DC

## 2014-12-02 MED ORDER — HYDROCORTISONE ACETATE 25 MG RE SUPP: 25.0000 mg | Freq: Two times a day (BID) | RECTAL | Status: DC

## 2014-12-02 NOTE — Discharge Summary (Signed)
Physician Discharge Summary  Billy [name] ZOX:[id number] DOB: 03-30-1932 DOA: 11/27/2014  PCP: [name], MD  Admit date: 11/27/2014 Discharge date: 12/02/2014  Time spent: 35 minutes  Recommendations for Outpatient Follow-up:  1. Patient given Anusol suppositories and Colace on discharge which are new medications 2. Patient will need consideration for flexible sigmoidoscopy as an outpatient with [facility]--we will attempt to schedule an appointment with them in the next 1-2 weeks 3. If patient has large amounts of bleeding may need a CBC within 1 week however it was felt on discharge he had primarily hemorrhoidal bleeding  4. Kindly delineate goals of care as an outpatient  Discharge Diagnoses:  Principal Problem:   Rectal bleeding Active Problems:   Diverticulosis   HTN (hypertension)   Dementia   Anemia   Alzheimer's disease   Discharge Condition: Stable  Diet recommendation: Heart healthy  Filed Weights   11/27/14 0612 11/27/14 2018  Weight: 59.693 kg (131 lb 9.6 oz) 59.467 kg (131 lb 1.6 oz)    History of present illness:  81 y/o ? known dementia, Pan diverticulosis [colonoscopy 2004, 2010 polyps-non cancerous] and recent admit 5/2-11/25/14 with presumed diverticular bleed readmitted 11/27/14 with further bleeding He was found to be heme positive on 2 occasions since admission this time He was monitored overnight ex 2 and noted to have loose stools with small amounts of BRB mixed in with it  Hospital Course:   Lower Gi bleed, painless-diverticular versus hemorrhoidal -pandiverticulosis on Colonoscopy 2010 -seen by Gi last week in [facility] for same -I spoke to Dr. Dulce Sellar On 5/611 who recommends conservative management and holding off on tagged RBC scan for now unless profuse bleed and watchful waiting.  -Note that patient has hemorrhoid at 4:00 position -Patient had no further profuse bleeding over the course of time from 5/11-5/13 2016 and it was felt  that he had bright red blood per rectum after passing stool which was consistent with external hemorrhoids. -He may benefit from a flexible sigmoidoscopy as an outpatient versus watchful waiting and he would also benefit from a CBC within 1 week -I discussed with his niece that patient will probably need Anusol suppositories as well as a stool softener and I prescribed both and send them to his pharmacy  2. Dementia-stage 5-6 -stable, on aricept and remeron for sleep -Reorients well  3. Severe malnutrition -supplement diet when possible  4. AKI -due to poor Po intake, dehydration -resolved after IV saline  5. HTN -on nifedipine, stable   Consultations:  Telephone consulted gastroenterology as above  Discharge Exam: Filed Vitals:   12/02/14 0515  BP: 132/70  Pulse: 101  Temp: 98.8 F (37.1 C)  Resp: 17   Alert pleasant oriented 79 about 50% of his meal No specific issues overnight per sitter Ambulatory  General: EOMI NCAT Cardiovascular: S1-S2 no murmur rub or gallop Respiratory: Clinically clear  Abdomen soft nontender nondistended no rebound  Discharge Instructions    Current Discharge Medication List    START taking these medications   Details  docusate sodium (COLACE) 100 MG capsule Take 1 capsule (100 mg total) by mouth 2 (two) times daily. Qty: 10 capsule, Refills: 0    hydrocortisone (ANUSOL-HC) 25 MG suppository Place 1 suppository (25 mg total) rectally 2 (two) times daily. Qty: 30 suppository, Refills: 0      CONTINUE these medications which have NOT CHANGED   Details  donepezil (ARICEPT) 5 MG tablet Take 1 tablet by mouth at bedtime. Refills: 11    finasteride (  PROSCAR) 5 MG tablet Take 1 tablet by mouth daily. Refills: 11    mirtazapine (REMERON) 7.5 MG tablet Take 1 tablet by mouth at bedtime. Refills: 11    NIFEdipine (PROCARDIA-XL/ADALAT-CC/NIFEDICAL-XL) 30 MG 24 hr tablet Take 30 mg by mouth daily. Refills: 11       No Known  Allergies    The results of significant diagnostics from this hospitalization (including imaging, microbiology, ancillary and laboratory) are listed below for reference.    Significant Diagnostic Studies: X-ray Chest Pa And Lateral  11/21/2014   CLINICAL DATA:  Shortness of breath and leukocytosis.  EXAM: CHEST  2 VIEW  COMPARISON:  PA and lateral chest 10/03/2011.  FINDINGS: Lung volumes are lower than on the comparison examination but the lungs are clear. Heart size is normal. No pneumothorax or pleural effusion. No focal bony abnormality.  IMPRESSION: No acute disease.   Electronically Signed   By: Drusilla Kannerhomas  Dalessio M.D.   On: 11/21/2014 21:39   Nm Gi Blood Loss  11/28/2014   CLINICAL DATA:  PT exam due to bloody stool, possible GI bleedPT's nurse explained to tech (AA) that has been incontinent and had passed stool early 11/28/14 with minor flecks of blood.PT Hx of HTN, HLD, Alzheimer's Disease, Dementia, BPH  EXAM: NUCLEAR MEDICINE GASTROINTESTINAL BLEEDING SCAN  TECHNIQUE: Sequential abdominal images were obtained following intravenous administration of Tc-3617m labeled red blood cells.  RADIOPHARMACEUTICALS:  30 mCi Tc-6217m in-vitro labeled red cells.  COMPARISON:  None.  FINDINGS: Study degraded by patient motion. Allowing for this, there is no evidence of a GI bleeding source. Normal uptake is seen in the liver.  IMPRESSION: No evidence of a GI bleeding source.   Electronically Signed   By: Amie Portlandavid  Ormond M.D.   On: 11/28/2014 16:29    Microbiology: No results found for this or any previous visit (from the past 240 hour(s)).   Labs: Basic Metabolic Panel:  Recent Labs Lab 11/27/14 0334 11/28/14 0458 11/29/14 0545 11/30/14 0423  NA 139 140 139 139  K 3.6 3.4* 3.5 4.1  CL 102 107 106 107  CO2 27 23 22 22   GLUCOSE 92 65* 78 75  BUN 23* 11 8 8   CREATININE 1.56* 0.97 0.96 0.99  CALCIUM 9.0 8.5* 8.9 8.4*   Liver Function Tests:  Recent Labs Lab 11/27/14 0334  AST 26  ALT 20   ALKPHOS 48  BILITOT 0.6  PROT 7.0  ALBUMIN 2.8*   No results for input(s): LIPASE, AMYLASE in the last 168 hours. No results for input(s): AMMONIA in the last 168 hours. CBC:  Recent Labs Lab 11/27/14 0334  11/29/14 0545 11/29/14 1712 11/30/14 0543 11/30/14 1701 12/02/14 0457  WBC 12.9*  < > 10.5 11.3* 9.4 9.5 10.8*  NEUTROABS 6.0  --   --   --   --  4.8 4.4  HGB 11.8*  < > 13.2 12.8* 11.0* 12.9* 12.0*  HCT 36.0*  < > 40.1 38.7* 33.5* 39.1 36.8*  MCV 87.8  < > 86.6 86.4 85.7 85.9 85.4  PLT 486*  < > 513* 549* 494* 505* 521*  < > = values in this interval not displayed. Cardiac Enzymes: No results for input(s): CKTOTAL, CKMB, CKMBINDEX, TROPONINI in the last 168 hours. BNP: BNP (last 3 results) No results for input(s): BNP in the last 8760 hours.  ProBNP (last 3 results) No results for input(s): PROBNP in the last 8760 hours.  CBG: No results for input(s): GLUCAP in the last 168 hours.  SignedRhetta Mura:  Asael Pann, JAI-GURMUKH  [facility] 12/02/2014, 8:32 AM

## 2014-12-02 NOTE — Care Management Note (Signed)
Case Management Note  Patient Details  Name: Billy KiefSylvester [name] MRN: [id number] Date of Birth: March 10, 1932  Subjective/Objective:           CM following for progression and d/c planning.        Action/Plan:  Noted that HHPT was not recommended by PT, family informed. Pt will see Dr Donette LarryHusain on Monday per niece and [facility]  Expected Discharge Date:                 12/02/2014 Expected Discharge Plan:  Home/Self Care  In-House Referral:  NA  Discharge planning Services  NA, CM Consult  Post Acute Care Choice:   Choice offered to:    DME Arranged:    DME Agency:     HH Arranged:    HH Agency:     Status of Service:     Medicare Important Message Given:  Yes Date Medicare IM Given:  11/30/14 Medicare IM give by:  Johny Shockheryl Mariha Sleeper RN MPH, case manager Date Additional Medicare IM Given:    Additional Medicare Important Message give by:     If discussed at Long Length of Stay Meetings, dates discussed:    Additional Comments:  [name], Jamya Starry U, RN 12/02/2014, 2:28 PM

## 2014-12-02 NOTE — Progress Notes (Signed)
Jerald KiefSylvester Warman to be D/C'd Home with 24 hour supervision per MD order.  Discussed prescriptions and follow up appointments with the patient. New prescriptions sent to pharmacy, medication list explained in detail. Pt family and caregiver verbalized understanding.    Medication List    TAKE these medications        docusate sodium 100 MG capsule  Commonly known as:  COLACE  Take 1 capsule (100 mg total) by mouth 2 (two) times daily.     donepezil 5 MG tablet  Commonly known as:  ARICEPT  Take 1 tablet by mouth at bedtime.     finasteride 5 MG tablet  Commonly known as:  PROSCAR  Take 1 tablet by mouth daily.     hydrocortisone 25 MG suppository  Commonly known as:  ANUSOL-HC  Place 1 suppository (25 mg total) rectally 2 (two) times daily.     mirtazapine 7.5 MG tablet  Commonly known as:  REMERON  Take 1 tablet by mouth at bedtime.     NIFEdipine 30 MG 24 hr tablet  Commonly known as:  PROCARDIA-XL/ADALAT-CC/NIFEDICAL-XL  Take 30 mg by mouth daily.        Filed Vitals:   12/02/14 0839  BP: 96/59  Pulse: 102  Temp: 98.9 F (37.2 C)  Resp: 18    Skin clean, dry and intact without evidence of skin break down, no evidence of skin tears noted. IV catheter discontinued intact. Site without signs and symptoms of complications. Dressing and pressure applied. Pt denies pain at this time. No complaints noted.  An After Visit Summary was printed and given to the patient. Patient escorted via WC, and D/C home via private auto.  Maruice Pieroni A 12/02/2014 1:38 PM

## 2015-05-17 ENCOUNTER — Inpatient Hospital Stay (HOSPITAL_COMMUNITY)
Admission: EM | Admit: 2015-05-17 | Discharge: 2015-05-30 | DRG: 981 | Disposition: A | Discharge status: Skilled Nursing Facility | Payer: Medicare Other | Attending: Internal Medicine | Admitting: Internal Medicine

## 2015-05-17 ENCOUNTER — Encounter (HOSPITAL_COMMUNITY): Payer: Self-pay | Admitting: Emergency Medicine

## 2015-05-17 DIAGNOSIS — Z87891 Personal history of nicotine dependence: Secondary | ICD-10-CM

## 2015-05-17 DIAGNOSIS — K529 Noninfective gastroenteritis and colitis, unspecified: Secondary | ICD-10-CM | POA: Diagnosis present

## 2015-05-17 DIAGNOSIS — E785 Hyperlipidemia, unspecified: Secondary | ICD-10-CM | POA: Diagnosis present

## 2015-05-17 DIAGNOSIS — N400 Enlarged prostate without lower urinary tract symptoms: Secondary | ICD-10-CM | POA: Diagnosis present

## 2015-05-17 DIAGNOSIS — M24521 Contracture, right elbow: Secondary | ICD-10-CM | POA: Diagnosis present

## 2015-05-17 DIAGNOSIS — Z79899 Other long term (current) drug therapy: Secondary | ICD-10-CM

## 2015-05-17 DIAGNOSIS — E46 Unspecified protein-calorie malnutrition: Secondary | ICD-10-CM | POA: Diagnosis present

## 2015-05-17 DIAGNOSIS — K625 Hemorrhage of anus and rectum: Secondary | ICD-10-CM | POA: Diagnosis not present

## 2015-05-17 DIAGNOSIS — Z681 Body mass index (BMI) 19 or less, adult: Secondary | ICD-10-CM

## 2015-05-17 DIAGNOSIS — W19XXXA Unspecified fall, initial encounter: Secondary | ICD-10-CM | POA: Diagnosis not present

## 2015-05-17 DIAGNOSIS — F0390 Unspecified dementia without behavioral disturbance: Secondary | ICD-10-CM | POA: Diagnosis present

## 2015-05-17 DIAGNOSIS — I998 Other disorder of circulatory system: Secondary | ICD-10-CM | POA: Clinically undetermined

## 2015-05-17 DIAGNOSIS — E43 Unspecified severe protein-calorie malnutrition: Secondary | ICD-10-CM | POA: Diagnosis present

## 2015-05-17 DIAGNOSIS — G9341 Metabolic encephalopathy: Secondary | ICD-10-CM | POA: Diagnosis not present

## 2015-05-17 DIAGNOSIS — E876 Hypokalemia: Secondary | ICD-10-CM | POA: Diagnosis not present

## 2015-05-17 DIAGNOSIS — I639 Cerebral infarction, unspecified: Secondary | ICD-10-CM

## 2015-05-17 DIAGNOSIS — T380X5A Adverse effect of glucocorticoids and synthetic analogues, initial encounter: Secondary | ICD-10-CM | POA: Diagnosis present

## 2015-05-17 DIAGNOSIS — I82401 Acute embolism and thrombosis of unspecified deep veins of right lower extremity: Secondary | ICD-10-CM

## 2015-05-17 DIAGNOSIS — I10 Essential (primary) hypertension: Secondary | ICD-10-CM | POA: Diagnosis not present

## 2015-05-17 DIAGNOSIS — K5190 Ulcerative colitis, unspecified, without complications: Secondary | ICD-10-CM | POA: Diagnosis present

## 2015-05-17 DIAGNOSIS — R55 Syncope and collapse: Secondary | ICD-10-CM | POA: Diagnosis not present

## 2015-05-17 DIAGNOSIS — F0280 Dementia in other diseases classified elsewhere without behavioral disturbance: Secondary | ICD-10-CM | POA: Diagnosis present

## 2015-05-17 DIAGNOSIS — K922 Gastrointestinal hemorrhage, unspecified: Secondary | ICD-10-CM | POA: Diagnosis not present

## 2015-05-17 DIAGNOSIS — E860 Dehydration: Secondary | ICD-10-CM | POA: Diagnosis present

## 2015-05-17 DIAGNOSIS — M24561 Contracture, right knee: Secondary | ICD-10-CM | POA: Diagnosis present

## 2015-05-17 DIAGNOSIS — I82621 Acute embolism and thrombosis of deep veins of right upper extremity: Secondary | ICD-10-CM | POA: Diagnosis present

## 2015-05-17 DIAGNOSIS — I82A11 Acute embolism and thrombosis of right axillary vein: Secondary | ICD-10-CM | POA: Diagnosis present

## 2015-05-17 DIAGNOSIS — K5791 Diverticulosis of intestine, part unspecified, without perforation or abscess with bleeding: Secondary | ICD-10-CM | POA: Diagnosis not present

## 2015-05-17 DIAGNOSIS — R52 Pain, unspecified: Secondary | ICD-10-CM

## 2015-05-17 DIAGNOSIS — G309 Alzheimer's disease, unspecified: Secondary | ICD-10-CM | POA: Diagnosis present

## 2015-05-17 LAB — CBC WITH DIFFERENTIAL/PLATELET
BASOS PCT: 1 %
Basophils Absolute: 0.1 10*3/uL (ref 0.0–0.1)
EOS PCT: 7 %
Eosinophils Absolute: 0.6 10*3/uL (ref 0.0–0.7)
HEMATOCRIT: 36.7 % — AB (ref 39.0–52.0)
Hemoglobin: 12.1 g/dL — ABNORMAL LOW (ref 13.0–17.0)
LYMPHS PCT: 18 %
Lymphs Abs: 1.5 10*3/uL (ref 0.7–4.0)
MCH: 28.8 pg (ref 26.0–34.0)
MCHC: 33 g/dL (ref 30.0–36.0)
MCV: 87.4 fL (ref 78.0–100.0)
Monocytes Absolute: 1.7 10*3/uL — ABNORMAL HIGH (ref 0.1–1.0)
Monocytes Relative: 20 %
Neutro Abs: 4.6 10*3/uL (ref 1.7–7.7)
Neutrophils Relative %: 54 %
PLATELETS: 426 10*3/uL — AB (ref 150–400)
RBC: 4.2 MIL/uL — ABNORMAL LOW (ref 4.22–5.81)
RDW: 14.9 % (ref 11.5–15.5)
WBC: 8.5 10*3/uL (ref 4.0–10.5)

## 2015-05-17 LAB — COMPREHENSIVE METABOLIC PANEL
ALK PHOS: 46 U/L (ref 38–126)
ALT: 14 U/L — ABNORMAL LOW (ref 17–63)
AST: 22 U/L (ref 15–41)
Albumin: 3 g/dL — ABNORMAL LOW (ref 3.5–5.0)
Anion gap: 10 (ref 5–15)
BUN: 14 mg/dL (ref 6–20)
CALCIUM: 9.2 mg/dL (ref 8.9–10.3)
CHLORIDE: 107 mmol/L (ref 101–111)
CO2: 24 mmol/L (ref 22–32)
CREATININE: 1.09 mg/dL (ref 0.61–1.24)
Glucose, Bld: 102 mg/dL — ABNORMAL HIGH (ref 65–99)
Potassium: 3.7 mmol/L (ref 3.5–5.1)
Sodium: 141 mmol/L (ref 135–145)
Total Bilirubin: 0.3 mg/dL (ref 0.3–1.2)
Total Protein: 6.6 g/dL (ref 6.5–8.1)

## 2015-05-17 LAB — PREPARE RBC (CROSSMATCH)

## 2015-05-17 LAB — HEMATOCRIT: HEMATOCRIT: 37.7 % — AB (ref 39.0–52.0)

## 2015-05-17 MED ORDER — DONEPEZIL HCL 5 MG PO TABS
5.0000 mg | ORAL_TABLET | Freq: Every day | ORAL | Status: DC
  Administered 2015-05-17 – 2015-05-29 (×11): 5 mg via ORAL
  Filled 2015-05-17 (×12): qty 1

## 2015-05-17 MED ORDER — SODIUM CHLORIDE 0.9 % IV SOLN
INTRAVENOUS | Status: DC
  Administered 2015-05-17: 23:00:00 via INTRAVENOUS

## 2015-05-17 MED ORDER — SODIUM CHLORIDE 0.9 % IJ SOLN
3.0000 mL | Freq: Two times a day (BID) | INTRAMUSCULAR | Status: DC
  Administered 2015-05-17 – 2015-05-21 (×7): 3 mL via INTRAVENOUS
  Administered 2015-05-22: 10 mL via INTRAVENOUS
  Administered 2015-05-22 – 2015-05-30 (×11): 3 mL via INTRAVENOUS

## 2015-05-17 MED ORDER — NIFEDIPINE ER 30 MG PO TB24
30.0000 mg | ORAL_TABLET | Freq: Every day | ORAL | Status: DC
  Administered 2015-05-18 – 2015-05-21 (×4): 30 mg via ORAL
  Filled 2015-05-17 (×4): qty 1

## 2015-05-17 MED ORDER — MIRTAZAPINE 15 MG PO TABS
7.5000 mg | ORAL_TABLET | Freq: Every day | ORAL | Status: DC
  Administered 2015-05-17 – 2015-05-29 (×11): 7.5 mg via ORAL
  Filled 2015-05-17 (×11): qty 1

## 2015-05-17 MED ORDER — ACETAMINOPHEN 325 MG PO TABS: 650.0000 mg | ORAL_TABLET | Freq: Four times a day (QID) | ORAL | Status: DC | PRN

## 2015-05-17 MED ORDER — HYDROCODONE-ACETAMINOPHEN 5-325 MG PO TABS
1.0000 | ORAL_TABLET | ORAL | Status: DC | PRN
  Administered 2015-05-19 – 2015-05-21 (×3): 1 via ORAL
  Administered 2015-05-21 – 2015-05-24 (×7): 2 via ORAL
  Administered 2015-05-24 – 2015-05-25 (×3): 1 via ORAL
  Filled 2015-05-17: qty 1
  Filled 2015-05-17 (×2): qty 2
  Filled 2015-05-17 (×2): qty 1
  Filled 2015-05-17 (×2): qty 2
  Filled 2015-05-17: qty 1
  Filled 2015-05-17: qty 2
  Filled 2015-05-17: qty 1
  Filled 2015-05-17 (×2): qty 2
  Filled 2015-05-17: qty 1

## 2015-05-17 MED ORDER — ACETAMINOPHEN 650 MG RE SUPP
650.0000 mg | Freq: Four times a day (QID) | RECTAL | Status: DC | PRN
Start: 2015-05-17 — End: 2015-05-26

## 2015-05-17 MED ORDER — ONDANSETRON HCL 4 MG PO TABS: 4.0000 mg | ORAL_TABLET | Freq: Four times a day (QID) | ORAL | Status: DC | PRN

## 2015-05-17 MED ORDER — FINASTERIDE 5 MG PO TABS
5.0000 mg | ORAL_TABLET | Freq: Every day | ORAL | Status: DC
  Administered 2015-05-18 – 2015-05-30 (×12): 5 mg via ORAL
  Filled 2015-05-17 (×12): qty 1

## 2015-05-17 MED ORDER — ONDANSETRON HCL 4 MG/2ML IJ SOLN
4.0000 mg | Freq: Four times a day (QID) | INTRAMUSCULAR | Status: DC | PRN
  Filled 2015-05-17: qty 2

## 2015-05-17 NOTE — H&P (Signed)
[facility]                                                                                    Patient Demographics  Billy [name], is a 81 y.o. male  CSN: [id number]  MRN: [id number]  DOB - 1931/12/28  Admit Date - 05/17/2015  Outpatient Primary MD for the patient is [name], MD   With History of -  Past Medical History  Diagnosis Date  . Hypertension   . Hyperlipidemia   . Alzheimer's disease   . HTN (hypertension) 11/21/2014  . Dementia 11/21/2014  . BPH (benign prostatic hyperplasia) 11/21/2014      History reviewed. No pertinent past surgical history.  in for   Chief Complaint  Patient presents with  . Rectal Bleeding     HPI  Billy [name]  is a 81 y.o. male, presenting with rectal bleeding for the last 3 weeks worsening in the last 2 days. No rectal pain. Patient has diverticulosis and external hemorrhoids noted by a colonoscopy in 2010 and the recent flexible sigmoidoscopy done for the same problem on 11/2014 at [facility]. Patient denies abdominal pain, nausea and vomiting. Denies history of nonsteroidal abuse. Patient lives at home with caretakers who was noted the rectal bleed lately. Patient has history of dementia and cannot give any significant history. Patient reports 3-4 episodes of diarrhea.    Review of Systems    In addition to the HPI above,  No Fever-chills, No Headache, No changes with Vision or hearing, No problems swallowing food or Liquids, No Chest pain, Cough or Shortness of Breath, No Abdominal pain, No Nausea or Vommitting,  No Blood in stool or Urine, No dysuria, No new skin rashes or bruises, No new joints pains-aches,  No new weakness, tingling, numbness in any extremity, No recent weight gain or loss, No polyuria, polydypsia or polyphagia, No significant Mental Stressors.  A full 10 point Review of Systems was done, except as stated above, all other Review of Systems were negative.   Social  History Social History  Substance Use Topics  . Smoking status: Former Games developer  . Smokeless tobacco: Not on file  . Alcohol Use: No     Family History Unable to obtain  Prior to Admission medications   Medication Sig Start Date End Date Taking? Authorizing Provider  donepezil (ARICEPT) 5 MG tablet Take 1 tablet by mouth at bedtime. 10/25/14  Yes Historical Provider, MD  finasteride (PROSCAR) 5 MG tablet Take 1 tablet by mouth daily. 10/25/14  Yes Historical Provider, MD  mirtazapine (REMERON) 7.5 MG tablet Take 1 tablet by mouth at bedtime. 11/02/14  Yes Historical Provider, MD  NIFEdipine (PROCARDIA-XL/ADALAT-CC/NIFEDICAL-XL) 30 MG 24 hr tablet Take 30 mg by mouth daily. 10/25/14  Yes Historical Provider, MD  docusate sodium (COLACE) 100 MG capsule Take 1 capsule (100 mg total) by mouth 2 (two) times daily. Patient not taking: Reported on 05/17/2015 12/02/14   Rhetta Mura, MD  hydrocortisone (ANUSOL-HC) 25 MG suppository Place 1 suppository (25 mg total) rectally 2 (two) times daily. Patient not taking: Reported on 05/17/2015 12/02/14   Rhetta Mura, MD    No Known Allergies  Physical  Exam  Vitals  Blood pressure 161/100, pulse 85, temperature 97.6 F (36.4 C), temperature source Oral, resp. rate 20, SpO2 100 %.   1. General elderly African-American gentleman, malnourished, well-developed  2. Flat affect and insight, Not Suicidal or Homicidal, pleasantly confused.  3. No F.N deficits, grossly   4. Ears and Eyes appear Normal, Conjunctivae clear, PERRLA. Moist Oral Mucosa.  5. Supple Neck, No JVD, No cervical lymphadenopathy appriciated, No Carotid Bruits.  6. Symmetrical Chest wall movement, Good air movement bilaterally, CTAB.  7. RRR, No Gallops, Rubs or Murmurs, No Parasternal Heave.  8. Positive Bowel Sounds, Abdomen Soft, Non tender, No organomegaly appriciated,No rebound -guarding or rigidity.  9.  No Cyanosis, Normal Skin Turgor, No Skin Rash or  Bruise.  10. Good muscle tone,  joints appear normal , no effusions, Normal ROM.  11. No Palpable Lymph Nodes in Neck or Axillae    Data Review  CBC  Recent Labs Lab 05/17/15 1525  WBC 8.5  HGB 12.1*  HCT 36.7*  PLT 426*  MCV 87.4  MCH 28.8  MCHC 33.0  RDW 14.9  LYMPHSABS 1.5  MONOABS 1.7*  EOSABS 0.6  BASOSABS 0.1   ------------------------------------------------------------------------------------------------------------------  Chemistries   Recent Labs Lab 05/17/15 1525  NA 141  K 3.7  CL 107  CO2 24  GLUCOSE 102*  BUN 14  CREATININE 1.09  CALCIUM 9.2  AST 22  ALT 14*  ALKPHOS 46  BILITOT 0.3   ------------------------------------------------------------------------------------------------------------------ CrCl cannot be calculated (Unknown ideal weight.). ------------------------------------------------------------------------------------------------------------------ No results for input(s): TSH, T4TOTAL, T3FREE, THYROIDAB in the last 72 hours.  Invalid input(s): FREET3   Coagulation profile No results for input(s): INR, PROTIME in the last 168 hours. ------------------------------------------------------------------------------------------------------------------- No results for input(s): DDIMER in the last 72 hours. -------------------------------------------------------------------------------------------------------------------  Cardiac Enzymes No results for input(s): CKMB, TROPONINI, MYOGLOBIN in the last 168 hours.  Invalid input(s): CK ------------------------------------------------------------------------------------------------------------------ Invalid input(s): POCBNP   ---------------------------------------------------------------------------------------------------------------  Urinalysis    Component Value Date/Time   COLORURINE YELLOW 11/21/2014 1555   APPEARANCEUR CLEAR 11/21/2014 1555   LABSPEC 1.015 11/21/2014  1555   PHURINE 6.0 11/21/2014 1555   GLUCOSEU NEGATIVE 11/21/2014 1555   HGBUR NEGATIVE 11/21/2014 1555   BILIRUBINUR NEGATIVE 11/21/2014 1555   KETONESUR 40* 11/21/2014 1555   PROTEINUR NEGATIVE 11/21/2014 1555   UROBILINOGEN 0.2 11/21/2014 1555   NITRITE NEGATIVE 11/21/2014 1555   LEUKOCYTESUR NEGATIVE 11/21/2014 1555    ----------------------------------------------------------------------------------------------------------------  Imaging results:   No results found.  Personally reviewed Old Chart from 11/21/2014    Assessment & Plan  1. Lower GI bleed     Previous workup noted. No results of his recent flexible sigmoidoscopy present. 2. Advanced dementia 3. Hypertension 4. Hyperlipidemia 5. BPH  Plan Place on observation on telemetry  monitor H&H IV fluids Consult GI in a.m. ? CT of abdomen if needed   DVT Prophylaxis SCDs  AM Labs Ordered, also please review Full Orders  Code Status full  Disposition Plan: Home  Time spent in minutes : 32 minutes  Condition GUARDED   @SIGNATURE @

## 2015-05-17 NOTE — ED Notes (Signed)
Pt c/o red rectal bleeding; pt sts some generalized weakness but denies pain

## 2015-05-17 NOTE — ED Notes (Signed)
[place]ed to [place] to [facility]. Placed on hold. Call not answered. Hung up. Called back x 2. Placed on hold for extended amount of time. Transferred twice. Spoke to receiving nurse who stated she would call back for report - in room with patient at this time. Unable to leave return number with nurse since she hung up.

## 2015-05-17 NOTE — ED Notes (Signed)
Pt. Just arrived to the room.

## 2015-05-17 NOTE — Progress Notes (Signed)
Patient arrived to [id number] via stretcher

## 2015-05-17 NOTE — ED Provider Notes (Signed)
CSN: [id number]     Arrival date & time 05/17/15  1457 History   First MD Initiated Contact with Patient 05/17/15 1711     Chief Complaint  Patient presents with  . Rectal Bleeding     (Consider location/radiation/quality/duration/timing/severity/associated sxs/prior Treatment) HPI   Patient is an 81 year old male with history of Alzheimer's hypertension hyperlipidemia and admission for rectal bleeding one year ago. Patient presents with rectal bleeding. Patient went to primary care's physician's office this week. They did labs and they called family to return to the emergency department today as a result of the labs.  Patient had roughly 1 week of blood in his underwear. It has recently increased to large amounts. Patient has no pain. No pain when stooling. He finds it is used to only bleed after stooling but now bleeds throughout the day.  Past Medical History  Diagnosis Date  . Hypertension   . Hyperlipidemia   . Alzheimer's disease   . HTN (hypertension) 11/21/2014  . Dementia 11/21/2014  . BPH (benign prostatic hyperplasia) 11/21/2014   History reviewed. No pertinent past surgical history. History reviewed. No pertinent family history. Social History  Substance Use Topics  . Smoking status: Former Games developermoker  . Smokeless tobacco: None  . Alcohol Use: No    Review of Systems  Constitutional: Positive for appetite change. Negative for fever and activity change.  HENT: Negative for drooling.   Eyes: Negative for discharge and redness.  Respiratory: Negative for cough and shortness of breath.   Cardiovascular: Negative for chest pain.  Gastrointestinal: Positive for vomiting, diarrhea, blood in stool and anal bleeding. Negative for nausea, abdominal pain and constipation.  Genitourinary: Negative for dysuria, urgency and frequency.  Musculoskeletal: Negative for arthralgias.  Allergic/Immunologic: Negative for immunocompromised state.  Neurological: Positive for dizziness.  Negative for seizures and speech difficulty.  Psychiatric/Behavioral: Negative for behavioral problems and agitation.  All other systems reviewed and are negative.     Allergies  Review of patient's allergies indicates no known allergies.  Home Medications   Prior to Admission medications   Medication Sig Start Date End Date Taking? Authorizing Provider  donepezil (ARICEPT) 5 MG tablet Take 1 tablet by mouth at bedtime. 10/25/14  Yes Historical Provider, MD  finasteride (PROSCAR) 5 MG tablet Take 1 tablet by mouth daily. 10/25/14  Yes Historical Provider, MD  mirtazapine (REMERON) 7.5 MG tablet Take 1 tablet by mouth at bedtime. 11/02/14  Yes Historical Provider, MD  NIFEdipine (PROCARDIA-XL/ADALAT-CC/NIFEDICAL-XL) 30 MG 24 hr tablet Take 30 mg by mouth daily. 10/25/14  Yes Historical Provider, MD  docusate sodium (COLACE) 100 MG capsule Take 1 capsule (100 mg total) by mouth 2 (two) times daily. Patient not taking: Reported on 05/17/2015 12/02/14   Rhetta MuraJai-Gurmukh Samtani, MD  hydrocortisone (ANUSOL-HC) 25 MG suppository Place 1 suppository (25 mg total) rectally 2 (two) times daily. Patient not taking: Reported on 05/17/2015 12/02/14   Rhetta MuraJai-Gurmukh Samtani, MD   BP 153/95 mmHg  Pulse 96  Temp(Src) 97.6 F (36.4 C) (Oral)  Resp 20  SpO2 99% Physical Exam  Constitutional: He appears well-nourished.  Very thin AA male   HENT:  Head: Normocephalic.  Mouth/Throat: Oropharynx is clear and moist.  Eyes: Conjunctivae are normal.  Neck: No tracheal deviation present.  Cardiovascular: Normal rate.   Pulmonary/Chest: Effort normal. No stridor. No respiratory distress.  Abdominal: Soft. He exhibits no distension. There is no tenderness. There is no guarding.  Genitourinary:  No evidence of fissue, blood present in underwear, + blood on  exam, no hemmorhoid noted  Musculoskeletal: Normal range of motion. He exhibits no edema.  Neurological: No cranial nerve deficit.  Skin: Skin is warm and dry. No  rash noted. He is not diaphoretic.  Psychiatric: He has a normal mood and affect.  Nursing note and vitals reviewed.   ED Course  Procedures (including critical care time) Labs Review Labs Reviewed  COMPREHENSIVE METABOLIC PANEL - Abnormal; Notable for the following:    Glucose, Bld 102 (*)    Albumin 3.0 (*)    ALT 14 (*)    All other components within normal limits  CBC WITH DIFFERENTIAL/PLATELET - Abnormal; Notable for the following:    RBC 4.20 (*)    Hemoglobin 12.1 (*)    HCT 36.7 (*)    Platelets 426 (*)    Monocytes Absolute 1.7 (*)    All other components within normal limits  TYPE AND SCREEN    Imaging Review No results found. I have personally reviewed and evaluated these images and lab results as part of my medical decision-making.   EKG Interpretation None      MDM   Final diagnoses:  None    Patient is a pleasant 81 year old male presenting with active rectal bleeding. Patient has had bleeding for the last 2 weeks but is worsened in the last week. Patient went to PCP and was sent here for follow-up because of concern for labs. On our lab tests today patient's hemoglobin is 12.. This is consistent with the last time he had a bleed.  In reviewing patient's chart appears that she was admitted for this similar thing in the past. He had a flexible sigmoidoscopy it was thought at that time to have a diverticular versus hemorrhoidal.  Concerned given the amount of bleeding present on exam. Patient is at risk for dropping his hemoglobin. We'll consider admission and GI consult.  I think the bleeding is lower GI in origin. He's had no vomiting. It is bright red blood not black.      Chasady Longwell Randall An, MD 05/20/15 1610

## 2015-05-18 ENCOUNTER — Encounter (HOSPITAL_COMMUNITY): Payer: Self-pay

## 2015-05-18 DIAGNOSIS — K922 Gastrointestinal hemorrhage, unspecified: Secondary | ICD-10-CM | POA: Diagnosis not present

## 2015-05-18 LAB — C DIFFICILE QUICK SCREEN W PCR REFLEX
C DIFFICILE (CDIFF) INTERP: NEGATIVE
C DIFFICILE (CDIFF) TOXIN: NEGATIVE
C Diff antigen: NEGATIVE

## 2015-05-18 LAB — HEMATOCRIT
HCT: 39.2 % (ref 39.0–52.0)
HEMATOCRIT: 38.2 % — AB (ref 39.0–52.0)

## 2015-05-18 MED ORDER — POLYETHYLENE GLYCOL 3350 17 GM/SCOOP PO POWD
255.0000 g | Freq: Once | ORAL | Status: AC
  Administered 2015-05-18: 255 g via ORAL
  Filled 2015-05-18: qty 255

## 2015-05-18 MED ORDER — MAGNESIUM CITRATE PO SOLN
1.0000 | Freq: Once | ORAL | Status: AC
  Administered 2015-05-18: 1 via ORAL
  Filled 2015-05-18: qty 296

## 2015-05-18 MED ORDER — SODIUM CHLORIDE 0.9 % IV SOLN: INTRAVENOUS | Status: DC

## 2015-05-18 MED ORDER — LORAZEPAM 0.5 MG PO TABS
0.5000 mg | ORAL_TABLET | Freq: Four times a day (QID) | ORAL | Status: DC | PRN
  Administered 2015-05-18 (×2): 0.5 mg via ORAL
  Filled 2015-05-18 (×2): qty 1
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
## 2015-05-18 NOTE — Progress Notes (Addendum)
Paged MD for Pt consult for patient.  He has generalized weakness and is from home alone  MD ordered PT consult

## 2015-05-18 NOTE — Progress Notes (Signed)
CM could not perform assessment at this time, LM with niece Ivy LynnWillia Johnson to call back.

## 2015-05-18 NOTE — Progress Notes (Signed)
Billy [name] ZOX:[id number] DOB: 1932/05/23 DOA: 05/17/2015 PCP: [name], MD  Brief narrative: 81 y/o  Mild dementia pabn diverticulosis on colonoscopy 2004/2010 had non cancerous polyp Has had 2 episodes of hospitalization with GI bleed thought to be diverticular I admitted him in 11/2014-he was to f/u with GI as OP   Past medical history-As per Problem list Chart reviewed as below-   Consultants:  gi  Procedures:  none  Antibiotics:  none   Subjective  somehat aggravated Hungry Passed 2 dark stools Nursing reports some cocerns about agitation so d/c tele   Objective    Interim History:   Telemetry:    Objective: Filed Vitals:   05/17/15 1945 05/17/15 2000 05/17/15 2127 05/18/15 0526  BP: 148/99 153/94 161/92 141/97  Pulse: 83 89 92 91  Temp:   98.7 F (37.1 C) 98.5 F (36.9 C)  TempSrc:   Oral Oral  Resp:   18 17  Weight:   54.9 kg (121 lb 0.5 oz)   SpO2: 100% 93% 100% 99%    Intake/Output Summary (Last 24 hours) at 05/18/15 1248 Last data filed at 05/18/15 0948  Gross per 24 hour  Intake      3 ml  Output    300 ml  Net   -297 ml    Exam:  General: eomi ncat Cardiovascular: s1 s 2no m/r/g Respiratory: clear Abdomen: soft nt nd  Data Reviewed: Basic Metabolic Panel:  Recent Labs Lab 05/17/15 1525  NA 141  K 3.7  CL 107  CO2 24  GLUCOSE 102*  BUN 14  CREATININE 1.09  CALCIUM 9.2   Liver Function Tests:  Recent Labs Lab 05/17/15 1525  AST 22  ALT 14*  ALKPHOS 46  BILITOT 0.3  PROT 6.6  ALBUMIN 3.0*   No results for input(s): LIPASE, AMYLASE in the last 168 hours. No results for input(s): AMMONIA in the last 168 hours. CBC:  Recent Labs Lab 05/17/15 1525 05/17/15 2240 05/18/15 0625 05/18/15 0932  WBC 8.5  --   --   --   NEUTROABS 4.6  --   --   --   HGB 12.1*  --   --   --   HCT 36.7* 37.7* 38.2* 39.2  MCV 87.4  --   --   --   PLT 426*  --   --   --    Cardiac Enzymes: No results for  input(s): CKTOTAL, CKMB, CKMBINDEX, TROPONINI in the last 168 hours. BNP: Invalid input(s): POCBNP CBG: No results for input(s): GLUCAP in the last 168 hours.  Recent Results (from the past 240 hour(s))  C difficile quick scan w PCR reflex     Status: None   Collection Time: 05/18/15  1:55 AM  Result Value Ref Range Status   C Diff antigen NEGATIVE NEGATIVE Final   C Diff toxin NEGATIVE NEGATIVE Final   C Diff interpretation Negative for toxigenic C. difficile  Final     Studies:              All Imaging reviewed and is as per above notation   Scheduled Meds: . donepezil  5 mg Oral QHS  . finasteride  5 mg Oral Daily  . mirtazapine  7.5 mg Oral QHS  . NIFEdipine  30 mg Oral Daily  . sodium chloride  3 mL Intravenous Q12H   Continuous Infusions: . sodium chloride 50 mL/hr at 05/17/15 2324     Assessment/Plan:  1. Probable LGIB vs hemorrhoidal bleed-Hb  in 12 range-defer to gi.  Might need sigmoidoscopy-rpt CBC in am.  Hold transfusion form now 2. Dementia-mild to moderate-re-start aricept 5mg  /remeron 7.5 when able 3. htn-continue Nifedepine30 daily-no hypotension  Code Status: full Family Communication: none + Disposition Plan:  inpatient  Pleas KochJai Kendel Bessey, MD  [facility] Pager [phone] 05/18/2015, 12:48 PM

## 2015-05-18 NOTE — Consult Note (Signed)
Reason for Consult: GI bleeding Referring Physician: Hospital team  Billy [name] is an 81 y.o. male.  HPI: Patient seen and examined and discussed with hospital team and his hospital computer chart and our office computer chart was reviewed and he was sent to the ER based on concerns over possible dehydration with increased BUN and creatinine as an outpatient and not so much for his bright red blood per rectum and his previous colonoscopy was reviewed in 2010 and he has no specific complaints but does have dementia and unfortunately no family member is available currently and his sitters could not provide any additional information other than he was having a little bright red blood and clots mixed with his stool  Past Medical History  Diagnosis Date  . Hypertension   . Hyperlipidemia   . Alzheimer's disease   . HTN (hypertension) 11/21/2014  . Dementia 11/21/2014  . BPH (benign prostatic hyperplasia) 11/21/2014    History reviewed. No pertinent past surgical history.  History reviewed. No pertinent family history.  Social History:  reports that he has quit smoking. He does not have any smokeless tobacco history on file. He reports that he does not drink alcohol or use illicit drugs.  Allergies: No Known Allergies  Medications: I have reviewed the patient's current medications.  Results for orders placed or performed during the hospital encounter of 05/17/15 (from the past 48 hour(s))  Comprehensive metabolic panel     Status: Abnormal   Collection Time: 05/17/15  3:25 PM  Result Value Ref Range   Sodium 141 135 - 145 mmol/L   Potassium 3.7 3.5 - 5.1 mmol/L   Chloride 107 101 - 111 mmol/L   CO2 24 22 - 32 mmol/L   Glucose, Bld 102 (H) 65 - 99 mg/dL   BUN 14 6 - 20 mg/dL   Creatinine, Ser 1.09 0.61 - 1.24 mg/dL   Calcium 9.2 8.9 - 10.3 mg/dL   Total Protein 6.6 6.5 - 8.1 g/dL   Albumin 3.0 (L) 3.5 - 5.0 g/dL   AST 22 15 - 41 U/L   ALT 14 (L) 17 - 63 U/L   Alkaline Phosphatase  46 38 - 126 U/L   Total Bilirubin 0.3 0.3 - 1.2 mg/dL   GFR calc non Af Amer >60 >60 mL/min   GFR calc Af Amer >60 >60 mL/min    Comment: (NOTE) The eGFR has been calculated using the CKD EPI equation. This calculation has not been validated in all clinical situations. eGFR's persistently <60 mL/min signify possible Chronic Kidney Disease.    Anion gap 10 5 - 15  Type and screen [place]     Status: None (Preliminary result)   Collection Time: 05/17/15  3:25 PM  Result Value Ref Range   ABO/RH(D) A POS    Antibody Screen NEG    Sample Expiration 05/20/2015    Unit Number [id number]    Blood Component Type RED CELLS,LR    Unit division 00    Status of Unit ALLOCATED    Transfusion Status OK TO TRANSFUSE    Crossmatch Result Compatible    Unit Number [id number]    Blood Component Type RED CELLS,LR    Unit division 00    Status of Unit ALLOCATED    Transfusion Status OK TO TRANSFUSE    Crossmatch Result Compatible   CBC with Differential     Status: Abnormal   Collection Time: 05/17/15  3:25 PM  Result Value Ref Range  WBC 8.5 4.0 - 10.5 K/uL   RBC 4.20 (L) 4.22 - 5.81 MIL/uL   Hemoglobin 12.1 (L) 13.0 - 17.0 g/dL   HCT 36.7 (L) 39.0 - 52.0 %   MCV 87.4 78.0 - 100.0 fL   MCH 28.8 26.0 - 34.0 pg   MCHC 33.0 30.0 - 36.0 g/dL   RDW 14.9 11.5 - 15.5 %   Platelets 426 (H) 150 - 400 K/uL   Neutrophils Relative % 54 %   Lymphocytes Relative 18 %   Monocytes Relative 20 %   Eosinophils Relative 7 %   Basophils Relative 1 %   Neutro Abs 4.6 1.7 - 7.7 K/uL   Lymphs Abs 1.5 0.7 - 4.0 K/uL   Monocytes Absolute 1.7 (H) 0.1 - 1.0 K/uL   Eosinophils Absolute 0.6 0.0 - 0.7 K/uL   Basophils Absolute 0.1 0.0 - 0.1 K/uL   WBC Morphology ATYPICAL LYMPHOCYTES     Comment: TOXIC GRANULATION  Prepare RBC     Status: None   Collection Time: 05/17/15 10:00 PM  Result Value Ref Range   Order Confirmation ORDER PROCESSED BY BLOOD BANK   Hematocrit     Status:  Abnormal   Collection Time: 05/17/15 10:40 PM  Result Value Ref Range   HCT 37.7 (L) 39.0 - 52.0 %  C difficile quick scan w PCR reflex     Status: None   Collection Time: 05/18/15  1:55 AM  Result Value Ref Range   C Diff antigen NEGATIVE NEGATIVE   C Diff toxin NEGATIVE NEGATIVE   C Diff interpretation Negative for toxigenic C. difficile   Hematocrit     Status: Abnormal   Collection Time: 05/18/15  6:25 AM  Result Value Ref Range   HCT 38.2 (L) 39.0 - 52.0 %  Hematocrit     Status: None   Collection Time: 05/18/15  9:32 AM  Result Value Ref Range   HCT 39.2 39.0 - 52.0 %    No results found.  ROS negative except above Blood pressure 147/79, pulse 99, temperature 98.6 F (37 C), temperature source Oral, resp. rate 20, weight 54.9 kg (121 lb 0.5 oz), SpO2 97 %. Physical Exam vital signs stable afebrile obviously demented no acute distress lungs are clear heart regular rate and rhythm abdomen is soft nontender labs okay  Assessment/Plan: Bright red blood per rectum Plan: We'll try to proceed with a flexible sigmoidoscopy or colonoscopy tomorrow and the hospital team will assist in getting consent from the family and hopefully he will drink enough prep to make that worthwhile  Anel Purohit E 05/18/2015, 4:49 PM

## 2015-05-18 NOTE — Progress Notes (Signed)
PT Cancellation Note  Patient Details Name: Billy KiefSylvester [name] MRN: [id number] DOB: 1932-01-15   Cancelled Treatment:    Reason Eval/Treat Not Completed: Patient declined, no reason specified   Despite 11 minutes of encouraging pt to participate (including his sitter attempting to persuade him), pt continued to refuse and would only state, "I didn't ask for your help, and I don't need it." Pt becoming louder and frustrated and further efforts deferred.   Zeki Bedrosian 05/18/2015, 3:16 PM Pager [phone]

## 2015-05-19 ENCOUNTER — Encounter (HOSPITAL_COMMUNITY)
Admission: EM | Disposition: A | Discharge status: Skilled Nursing Facility | Payer: Self-pay | Source: Home / Self Care | Attending: Internal Medicine

## 2015-05-19 ENCOUNTER — Encounter (HOSPITAL_COMMUNITY): Payer: Self-pay | Admitting: Gastroenterology

## 2015-05-19 ENCOUNTER — Observation Stay (HOSPITAL_COMMUNITY): Payer: Medicare Other | Admitting: Anesthesiology

## 2015-05-19 DIAGNOSIS — K922 Gastrointestinal hemorrhage, unspecified: Secondary | ICD-10-CM | POA: Diagnosis not present

## 2015-05-19 HISTORY — PX: COLONOSCOPY: SHX5424

## 2015-05-19 LAB — GLUCOSE, CAPILLARY: Glucose-Capillary: 89 mg/dL (ref 65–99)

## 2015-05-19 SURGERY — COLONOSCOPY
Anesthesia: Monitor Anesthesia Care

## 2015-05-19 MED ORDER — PHENYLEPHRINE HCL 10 MG/ML IJ SOLN
INTRAMUSCULAR | Status: DC | PRN
  Administered 2015-05-19 (×2): 40 ug via INTRAVENOUS

## 2015-05-19 MED ORDER — MESALAMINE 400 MG PO CPDR
800.0000 mg | DELAYED_RELEASE_CAPSULE | Freq: Three times a day (TID) | ORAL | Status: DC
  Administered 2015-05-19 – 2015-05-30 (×24): 800 mg via ORAL
  Filled 2015-05-19 (×38): qty 2

## 2015-05-19 MED ORDER — LIDOCAINE HCL (CARDIAC) 20 MG/ML IV SOLN
INTRAVENOUS | Status: DC | PRN
  Administered 2015-05-19: 50 mg via INTRAVENOUS

## 2015-05-19 MED ORDER — PROPOFOL 10 MG/ML IV BOLUS
INTRAVENOUS | Status: DC | PRN
  Administered 2015-05-19: 30 mg via INTRAVENOUS
  Administered 2015-05-19 (×4): 20 mg via INTRAVENOUS

## 2015-05-19 MED ORDER — LACTATED RINGERS IV SOLN
INTRAVENOUS | Status: DC | PRN
  Administered 2015-05-19: 13:00:00 via INTRAVENOUS

## 2015-05-19 MED ORDER — SODIUM CHLORIDE 0.9 % IV SOLN: INTRAVENOUS | Status: DC

## 2015-05-19 NOTE — Progress Notes (Signed)
Billy [name] 1:06 PM  Subjective: Patient without any current complaints and supposedly drank his prep and unfortunately no new labs today  Objective: Vital signs stable afebrile no acute distress exam please see preassessment evaluation  Assessment: Bright red blood per rectum  Plan: Okay to proceed with flexible sigmoidoscopy and possible colonoscopy with anesthesia assistance  Billy [facility] Center[facility],Billy [name]  Pager [phone] After 5PM or if no answer call [phone]

## 2015-05-19 NOTE — Progress Notes (Addendum)
OT Cancellation Note  Patient Details Name: Jerald KiefSylvester Hodo MRN: [id number] DOB: 20-Jul-1932   Cancelled Treatment:    Reason Eval/Treat Not Completed: Patient at procedure or test/ unavailable. Will reattempt.   [name], Averee Harb M  Aasiya Creasey Iront[place]onarpe, [name] [phone]   05/19/2015, 1:05 PM

## 2015-05-19 NOTE — Anesthesia Preprocedure Evaluation (Addendum)
Anesthesia Evaluation  Patient identified by MRN, date of birth, ID band Patient awake and Patient confused    Reviewed: Allergy & Precautions, NPO status , Patient's Chart, lab work & pertinent test results  History of Anesthesia Complications Negative for: history of anesthetic complications  Airway Mallampati: I  TM Distance: >3 FB Neck ROM: Full    Dental  (+) Loose, Poor Dentition, Dental Advisory Given   Pulmonary former smoker,    breath sounds clear to auscultation       Cardiovascular hypertension, Pt. on medications (-) angina Rhythm:Regular Rate:Normal     Neuro/Psych PSYCHIATRIC DISORDERS (Alzheimer's) negative neurological ROS     GI/Hepatic Neg liver ROS, neg GERD  ,Lower GI bleed   Endo/Other  negative endocrine ROS  Renal/GU negative Renal ROS     Musculoskeletal   Abdominal   Peds  Hematology negative hematology ROS (+)   Anesthesia Other Findings   Reproductive/Obstetrics                            Anesthesia Physical Anesthesia Plan  ASA: III  Anesthesia Plan: MAC   Post-op Pain Management:    Induction: Intravenous  Airway Management Planned: Simple Face Mask and Natural Airway  Additional Equipment:   Intra-op Plan:   Post-operative Plan:   Informed Consent: I have reviewed the patients History and Physical, chart, labs and discussed the procedure including the risks, benefits and alternatives for the proposed anesthesia with the patient or authorized representative who has indicated his/her understanding and acceptance.   Dental advisory given  Plan Discussed with: CRNA and Surgeon  Anesthesia Plan Comments: (Plan routine monitors, MAC)        Anesthesia Quick Evaluation

## 2015-05-19 NOTE — Progress Notes (Signed)
Patient alert and oriented x4 signed consent for procedure NA Billy [name] witness.

## 2015-05-19 NOTE — Care Management Note (Signed)
Case Management Note  Patient Details  Name: Billy KiefSylvester [name] MRN: [id number] Date of Birth: 07-09-1932  Subjective/Objective:                  Date-05-19-15 Initial Assessment  Spoke with patient's niece over the phone.  Introduced self as Sports coachcase manager and explained role in discharge planning and how to be reached.  [place] family friend, Malachi BondsGloria who provides 24 hour a day supervision.  Verified patient anticipates to go home with friend attime of discharge and will have full-time supervision.  Patient has DME cane. Expressed potential need for no other DME.  Patient denied  needing help with their medication.  Patient is driven by friend to MD appointments.  Patient states they currently receive HH services through no one.    Plan: CM will continue to follow for discharge planning and [facility].   Lawerance Sabalebbie Khiana Camino RN BSN CM [phone]   Action/Plan:   Expected Discharge Date:                  Expected Discharge Plan:  Home w Home Health Services  In-House Referral:     Discharge planning Services  CM Consult  Post Acute Care Choice:    Choice offered to:     DME Arranged:    DME Agency:     HH Arranged:    HH Agency:     Status of Service:  In process, will continue to follow  Medicare Important Message Given:    Date Medicare IM Given:    Medicare IM give by:    Date Additional Medicare IM Given:    Additional Medicare Important Message give by:     If discussed at Long Length of Stay Meetings, dates discussed:    Additional Comments:  Lawerance SabalDebbie Kylee Nardozzi, RN 05/19/2015, 12:57 PM

## 2015-05-19 NOTE — Progress Notes (Signed)
Subjective/Objective 81 year old male admitted 05/17/2015 with rectal bleeding. Had bowel movement in bed and then was up with assistance to bathroom when he became non-responsive and was pulseless. Code Blue initiated however patient recovered without CPR or medication intervention.   Scheduled Meds: . donepezil  5 mg Oral QHS  . finasteride  5 mg Oral Daily  . Mesalamine  800 mg Oral TID  . mirtazapine  7.5 mg Oral QHS  . NIFEdipine  30 mg Oral Daily  . sodium chloride  3 mL Intravenous Q12H   Continuous Infusions: . sodium chloride 50 mL/hr at 05/17/15 2324  . sodium chloride     PRN Meds:acetaminophen **OR** acetaminophen, HYDROcodone-acetaminophen, LORazepam, ondansetron **OR** ondansetron (ZOFRAN) IV  Vital signs in last 24 hours: Temp:  [97.9 F (36.6 C)-98.7 F (37.1 C)] 97.9 F (36.6 C) (10/28 1431) Pulse Rate:  [79-107] 104 (10/28 2138) Resp:  [15-22] 16 (10/28 1431) BP: (73-161)/(45-125) 137/83 mmHg (10/28 2138) SpO2:  [90 %-100 %] 100 % (10/28 2138) Weight:  [54.885 kg (121 lb)] 54.885 kg (121 lb) (10/28 1302)  Intake/Output last 3 shifts: I/O last 3 completed shifts: In: 1048.8 [P.O.:480; I.V.:568.8] Out: 703 [Urine:700; Stool:3] Intake/Output this shift: Total I/O In: -  Out: 50 [Urine:50]   Focused physical exam: Physical Examination: General appearance - Alert, follows commands, answers questions Mental status - Appropriate responses to instructions/questions, not oriented to situation Chest - clear bilaterally Heart - sinus tachycardia, regular rhythm Neurological - no focal findings or movement disorder  Problem Assessment/Plan   Brief period of nonresponsiveness - ? Arrest. Recovery without intervention. Possible vasovagal response from bowel movement. Now alert and following commands. VSS. Will place on telemetry and monitor.

## 2015-05-19 NOTE — Evaluation (Signed)
Physical Therapy Evaluation Patient Details Name: Billy [name] MRN: [id number] DOB: 07-Jan-1932 Today's Date: 05/19/2015   History of Present Illness  Billy [name] is a 81 y.o. male with h/o Alheimer's dementia presenting with rectal bleeding for the last 3 weeks worsening in the last 2 days.  Clinical Impression  Pt admitted with/for GIB and AMS.  Pt currently limited functionally due to the problems listed below.  (see problems list.)  Pt will benefit from PT to maximize function and safety to be able to get home safely with available assist of family/PCA.     Follow Up Recommendations Home health PT;Supervision for mobility/OOB    Equipment Recommendations  Other (comment) (have HHPT check for tub seat and 3 in 1 needs)    Recommendations for Other Services       Precautions / Restrictions Precautions Precautions: Fall      Mobility  Bed Mobility Overal bed mobility: Needs Assistance Bed Mobility: Supine to Sit;Sit to Supine     Supine to sit: Supervision Sit to supine: Min assist   General bed mobility comments: cues for technique and safety  Transfers Overall transfer level: Needs assistance   Transfers: Sit to/from Stand Sit to Stand: Min assist         General transfer comment: min steady due to decrease balance and sore R foot.  Ambulation/Gait Ambulation/Gait assistance: Min assist Ambulation Distance (Feet): 20 Feet Assistive device: Rolling walker (2 wheeled) Gait Pattern/deviations: Step-through pattern;Antalgic Gait velocity: slow   General Gait Details: limited gait due to need to go to endoscopy.  Gait unsteady and antalgic, but improving with each step.  Stairs            Wheelchair Mobility    Modified Rankin (Stroke Patients Only)       Balance Overall balance assessment: Needs assistance Sitting-balance support: No upper extremity supported;Single extremity supported Sitting balance-Leahy Scale: Fair      Standing balance support: Bilateral upper extremity supported;Single extremity supported Standing balance-Leahy Scale: Poor                               Pertinent Vitals/Pain Pain Assessment: Faces Faces Pain Scale: Hurts little more Pain Location: R foot across toes Pain Descriptors / Indicators: Sore Pain Intervention(s): Monitored during session    Home Living Family/patient expects to be discharged to:: Private residence Living Arrangements: Other relatives Available Help at Discharge: Family;Available 24 hours/day Type of Home: House Home Access: Level entry   Entrance Stairs-Number of Steps: 1 Home Layout: One level Home Equipment: Cane - single point Additional Comments: Information from pt's niece.    Prior Function Level of Independence: Needs assistance   Gait / Transfers Assistance Needed: Supervision to min guard  ADL's / Homemaking Assistance Needed: set up/S  Comments: Family is with pt 24/7 and assists as needed with ADL      Hand Dominance   Dominant Hand: Right    Extremity/Trunk Assessment   Upper Extremity Assessment: Defer to OT evaluation           Lower Extremity Assessment: Overall WFL for tasks assessed;RLE deficits/detail;LLE deficits/detail RLE Deficits / Details: grosslty 4/5 except hip flexore 4-/5  generall uncoordinated LLE Deficits / Details: similar to R side.  L generally stronger than R     Communication   Communication: No difficulties  Cognition Arousal/Alertness: Awake/alert Behavior During Therapy: WFL for tasks assessed/performed Overall Cognitive Status: History of cognitive impairments - at  baseline                      General Comments      Exercises        Assessment/Plan    PT Assessment Patient needs continued PT services  PT Diagnosis Difficulty walking;Generalized weakness   PT Problem List Decreased strength;Decreased activity tolerance;Decreased balance;Decreased  mobility;Decreased knowledge of use of DME  PT Treatment Interventions Gait training;DME instruction;Functional mobility training;Therapeutic activities;Patient/family education   PT Goals (Current goals can be found in the Care Plan section) Acute Rehab PT Goals Patient Stated Goal: back home PT Goal Formulation: With patient Time For Goal Achievement: 06/02/15 Potential to Achieve Goals: Good    Frequency Min 3X/week   Barriers to discharge        Co-evaluation               End of Session   Activity Tolerance: Patient limited by pain;Patient tolerated treatment well Patient left: in bed;with call bell/phone within reach;with nursing/sitter in room (going to endo) Nurse Communication: Mobility status    Functional Assessment Tool Used: clinical judgement Functional Limitation: Mobility: Walking and moving around Mobility: Walking and Moving Around Current Status ([id number](G8978): At least 1 percent but less than 20 percent impaired, limited or restricted Mobility: Walking and Moving Around Goal Status [phone](G8979): At least 1 percent but less than 20 percent impaired, limited or restricted    Time: 1236-1255 PT Time Calculation (min) (ACUTE ONLY): 19 min   Charges:   PT Evaluation $Initial PT Evaluation Tier I: 1 Procedure     PT G Codes:   PT G-Codes **NOT FOR INPATIENT CLASS** Functional Assessment Tool Used: clinical judgement Functional Limitation: Mobility: Walking and moving around Mobility: Walking and Moving Around Current Status (U9811(G8978): At least 1 percent but less than 20 percent impaired, limited or restricted Mobility: Walking and Moving Around Goal Status [phone](G8979): At least 1 percent but less than 20 percent impaired, limited or restricted    Billy [name], Billy GumKenneth [name] 05/19/2015, 1:24 PM  05/19/2015  [place] BingKen Billy [name], PT [phone]  (pager)

## 2015-05-19 NOTE — Progress Notes (Signed)
Attempted to contact niece Willa to assess discharge needs, unable to contact on home or cell, left message to call back.

## 2015-05-19 NOTE — Code Documentation (Signed)
  Patient Name: Billy [name]   MRN: [id number]   Date of Birth/ Sex: 12/22/1931 , male      Admission Date: 05/17/2015  Attending Provider: Rhetta MuraJai-Gurmukh Samtani, MD  Primary Diagnosis: <principal problem not specified>   Indication: Pt was in his usual state of health until this PM, when he was noted to be unconscious without palpable pulse. Code blue was subsequently called. At the time of arrival on scene, ACLS protocol was underway.   Technical Description:  - CPR performance duration:  0  minute  - Was defibrillation or cardioversion used? No   - Was external pacer placed? No  - Was patient intubated pre/post CPR? No   Medications Administered: Y = Yes; Blank = No Amiodarone    Atropine    Calcium    Epinephrine    Lidocaine    Magnesium    Norepinephrine    Phenylephrine    Sodium bicarbonate    Vasopressin     Post CPR evaluation:  - Patient regained consciousness spontaneously. He was oriented x 1 with no focal neuro deficits. - What is current rhythm? NSR - What is current hemodynamic status? stable  Miscellaneous Information:  - Labs sent, including: none  - Primary team notified?  Yes  - Family Notified? No  - Additional notes/ transfer status:  Recommend change to telemetry.     Lora PaulaJennifer T Krall, MD  05/19/2015, 9:51 PM

## 2015-05-19 NOTE — Op Note (Signed)
[facility] [address] [place], [id number]   COLONOSCOPY PROCEDURE REPORT     EXAM DATE: 05/19/2015  PATIENT NAME:      Billy [name], Billy [name]           MR #: [id number] BIRTHDATE:       [birth year]      VISIT #:     [id number]  ATTENDING:     Vida RiggerMarc Khoen Genet, MD     STATUS:     outpatient ASSISTANT:      Kandice RobinsonsAwaka, Guillaume, Monday, Sarah, and Valera CastleWilson, Crystal J  INDICATIONS:  The patient is a 81 yr old male here for a colonoscopy due to hematochezia. PROCEDURE PERFORMED:     Colonoscopy with biopsy MEDICATIONS:     Propofol 150 mg IV and Lidocaine 50 mg IV ESTIMATED BLOOD LOSS:     None  CONSENT: The patient understands the risks and benefits of the procedure and understands that these risks include, but are not limited to: sedation, allergic reaction, infection, perforation and/or bleeding. Alternative means of evaluation and treatment include, among others: physical exam, x-rays, and/or surgical intervention. The patient elects to proceed with this endoscopic procedure.  DESCRIPTION OF PROCEDURE: During intra-op preparation period all mechanical & medical equipment was checked for proper function. Hand hygiene and appropriate measures for infection prevention was taken. After the risks, benefits and alternatives of the procedure were thoroughly explained, Informed consent was verified, confirmed and timeout was successfully executed by the treatment team. A digital exam revealed no abnormalities of the rectum. The Pentax Ped Colon [id number] endoscope was introduced through the anus and advanced to the terminal ileum which was intubated for a short distance. the prep was fair with some formed stool in the transverse ascending and cecum and also lots of sigmoid spasm and inability to hold air made complete visualization difficult. The instrument was then slowly withdrawn as the colon was fully examined.Estimated blood loss is zero unless otherwise  noted in this procedure report. the findings are recorded below      Retroflexed views revealed no abnormalities. The scope was then completely withdrawn from the patient and the procedure terminated. SCOPE WITHDRAWAL TIME:  see nurse's note    ADVERSE EVENTS:      There were no immediate complications.  IMPRESSIONS: 1.      mild to moderate colitis throughout status post biopsy  seemingly sparing the cecum and proximal ascending and most distal rectum    2. primarily sigmoid small diverticuli with few scattered throughout the remainder of the colon 3. transverse and descending small polyp status post cold biopsy 4. normal terminal ileum 5. otherwise within normal limits as above with cecum and parts of ascending transverse and sigmoid not well seen  RECOMMENDATIONS:      await pathology if compatible with Crohn's or ulcerative colitis probably begin steroids or 5-ASA and will slowly advance diet RECALL:      as needed  _____________________________ Vida RiggerMarc Ieesha Abbasi, MD eSigned:  Vida RiggerMarc Loletta Harper, MD 05/19/2015 2:04 PM   cc:  Georgann HousekeeperKarrar Husain, M.D.   CPT CODES: ICD CODES:  The ICD and CPT codes recommended by this software are interpretations from the data that the clinical staff has captured with the software.  The verification of the translation of this report to the ICD and CPT codes and modifiers is the sole responsibility of the health care institution and practicing physician where this report was generated.  [employer] will not be held responsible for  the validity of the ICD and CPT codes included on this report.  AMA assumes no liability for data contained or not contained herein. CPT is a Publishing rights manager of the [employer].   PATIENT NAME:  Billy [name], Billy [name] MR#: [id number]

## 2015-05-19 NOTE — Progress Notes (Signed)
Billy [name] WUJ:[id number] DOB: 1931/08/15 DOA: 05/17/2015 PCP: [name], MD  Brief narrative:  81 y/o  Mild dementia pabn diverticulosis on colonoscopy 2004/2010 had non cancerous polyp Has had 2 episodes of hospitalization with GI bleed thought to be diverticular I admitted him in 11/2014-he was to f/u with GI as OP  Past medical history-As per Problem list Chart reviewed as below-   Consultants:  gi  Procedures:  none  Antibiotics:  none   Subjective      Objective    Interim History:   Telemetry:    Objective: Filed Vitals:   05/18/15 0526 05/18/15 1441 05/18/15 2304 05/19/15 0655  BP: 141/97 147/79 146/74 161/125  Pulse: 91 99 102 79  Temp: 98.5 F (36.9 C) 98.6 F (37 C) 98.7 F (37.1 C) 98.3 F (36.8 C)  TempSrc: Oral Oral Oral Oral  Resp: Weight:      SpO2: 99% 97% 90% 99%    Intake/Output Summary (Last 24 hours) at 05/19/15 0907 Last data filed at 05/19/15 0556  Gross per 24 hour  Intake 848.83 ml  Output      3 ml  Net 845.83 ml    Exam:  General: eomi ncat Cardiovascular: s1 s 2no m/r/g Respiratory: clear Abdomen: soft nt nd  Data Reviewed: Basic Metabolic Panel:  Recent Labs Lab 05/17/15 1525  NA 141  K 3.7  CL 107  CO2 24  GLUCOSE 102*  BUN 14  CREATININE 1.09  CALCIUM 9.2   Liver Function Tests:  Recent Labs Lab 05/17/15 1525  AST 22  ALT 14*  ALKPHOS 46  BILITOT 0.3  PROT 6.6  ALBUMIN 3.0*   No results for input(s): LIPASE, AMYLASE in the last 168 hours. No results for input(s): AMMONIA in the last 168 hours. CBC:  Recent Labs Lab 05/17/15 1525 05/17/15 2240 05/18/15 0625 05/18/15 0932  WBC 8.5  --   --   --   NEUTROABS 4.6  --   --   --   HGB 12.1*  --   --   --   HCT 36.7* 37.7* 38.2* 39.2  MCV 87.4  --   --   --   PLT 426*  --   --   --    Cardiac Enzymes: No results for input(s): CKTOTAL, CKMB, CKMBINDEX, TROPONINI in the last 168 hours. BNP: Invalid  input(s): POCBNP CBG: No results for input(s): GLUCAP in the last 168 hours.  Recent Results (from the past 240 hour(s))  C difficile quick scan w PCR reflex     Status: None   Collection Time: 05/18/15  1:55 AM  Result Value Ref Range Status   C Diff antigen NEGATIVE NEGATIVE Final   C Diff toxin NEGATIVE NEGATIVE Final   C Diff interpretation Negative for toxigenic C. difficile  Final     Studies:              All Imaging reviewed and is as per above notation   Scheduled Meds: . donepezil  5 mg Oral QHS  . finasteride  5 mg Oral Daily  . mirtazapine  7.5 mg Oral QHS  . NIFEdipine  30 mg Oral Daily  . sodium chloride  3 mL Intravenous Q12H   Continuous Infusions: . sodium chloride 50 mL/hr at 05/17/15 2324  . sodium chloride Stopped (05/19/15 0700)  . sodium chloride       Assessment/Plan:  1. Probable LGIB vs hemorrhoidal bleed-Hb in 12 range-defer to gi.  Labs are pending from this morning .appreciate gastroenterology input as to plan to do colonoscopy at some point today  Hold transfusion for now.  Needs IV replaced-nursing is aware 2. Dementia-mild to moderate-re-start aricept 5mg  /remeron 7.5 when able 3. htn-continue Nifedepine30 daily-no hypotension  Code Status: full Family Communication: none + Disposition Plan:  inpatient  Pleas KochJai Zidane Renner, MD  [facility] Pager [phone] 05/19/2015, 9:07 AM

## 2015-05-19 NOTE — Transfer of Care (Signed)
Immediate Anesthesia Transfer of Care Note  Patient: Billy KiefSylvester [name]  Procedure(s) Performed: Procedure(s): COLONOSCOPY (N/A)  Patient Location: Endoscopy Unit  Anesthesia Type:MAC  Level of Consciousness: awake, alert  and oriented  Airway & Oxygen Therapy: Patient Spontanous Breathing and Patient connected to nasal cannula oxygen  Post-op Assessment: Report given to RN and Post -op Vital signs reviewed and stable  Post vital signs: Reviewed and stable  Last Vitals:  Filed Vitals:   05/19/15 1401  BP: 73/45  Pulse:   Temp:   Resp: 17    Complications: No apparent anesthesia complications

## 2015-05-19 NOTE — Anesthesia Postprocedure Evaluation (Signed)
  Anesthesia Post-op Note  Patient: Billy KiefSylvester Stern  Procedure(s) Performed: Procedure(s): COLONOSCOPY (N/A)  Patient Location: Endoscopy Unit  Anesthesia Type:MAC  Level of Consciousness: awake, alert  and patient cooperative  Airway and Oxygen Therapy: Patient Spontanous Breathing  Post-op Pain: none  Post-op Assessment: Post-op Vital signs reviewed, Patient's Cardiovascular Status Stable, Respiratory Function Stable, Patent Airway, No signs of Nausea or vomiting and Pain level controlled              Post-op Vital Signs: Reviewed and stable  Last Vitals:  Filed Vitals:   05/19/15 1431  BP: 142/91  Pulse: 100  Temp: 36.6 C  Resp: 16    Complications: No apparent anesthesia complications

## 2015-05-20 DIAGNOSIS — K922 Gastrointestinal hemorrhage, unspecified: Secondary | ICD-10-CM | POA: Diagnosis not present

## 2015-05-20 LAB — CBC WITH DIFFERENTIAL/PLATELET
BAND NEUTROPHILS: 0 %
BASOS ABS: 0 10*3/uL (ref 0.0–0.1)
BASOS PCT: 0 %
Blasts: 0 %
EOS ABS: 0.3 10*3/uL (ref 0.0–0.7)
Eosinophils Relative: 3 %
HCT: 37.3 % — ABNORMAL LOW (ref 39.0–52.0)
HEMOGLOBIN: 12.7 g/dL — AB (ref 13.0–17.0)
Lymphocytes Relative: 7 %
Lymphs Abs: 0.7 10*3/uL (ref 0.7–4.0)
MCH: 28.9 pg (ref 26.0–34.0)
MCHC: 34 g/dL (ref 30.0–36.0)
MCV: 85 fL (ref 78.0–100.0)
METAMYELOCYTES PCT: 0 %
MONO ABS: 1.2 10*3/uL — AB (ref 0.1–1.0)
MYELOCYTES: 0 %
Monocytes Relative: 12 %
Neutro Abs: 7.7 10*3/uL (ref 1.7–7.7)
Neutrophils Relative %: 78 %
Other: 0 %
PROMYELOCYTES ABS: 0 %
Platelets: 323 10*3/uL (ref 150–400)
RBC: 4.39 MIL/uL (ref 4.22–5.81)
RDW: 14.4 % (ref 11.5–15.5)
WBC: 9.9 10*3/uL (ref 4.0–10.5)
nRBC: 0 /100 WBC

## 2015-05-20 LAB — COMPREHENSIVE METABOLIC PANEL
ALK PHOS: 45 U/L (ref 38–126)
ALT: 17 U/L (ref 17–63)
ANION GAP: 12 (ref 5–15)
AST: 26 U/L (ref 15–41)
Albumin: 2.7 g/dL — ABNORMAL LOW (ref 3.5–5.0)
BUN: 15 mg/dL (ref 6–20)
CALCIUM: 8.5 mg/dL — AB (ref 8.9–10.3)
CO2: 23 mmol/L (ref 22–32)
CREATININE: 1.37 mg/dL — AB (ref 0.61–1.24)
Chloride: 104 mmol/L (ref 101–111)
GFR, EST AFRICAN AMERICAN: 54 mL/min — AB (ref >60)
GFR, EST NON AFRICAN AMERICAN: 47 mL/min — AB (ref >60)
Glucose, Bld: 79 mg/dL (ref 65–99)
Potassium: 3.6 mmol/L (ref 3.5–5.1)
Sodium: 139 mmol/L (ref 135–145)
Total Bilirubin: 0.6 mg/dL (ref 0.3–1.2)
Total Protein: 6.6 g/dL (ref 6.5–8.1)

## 2015-05-20 LAB — TROPONIN I
TROPONIN I: 0.08 ng/mL — AB (ref <0.031)
TROPONIN I: 0.08 ng/mL — AB (ref <0.031)
Troponin I: 0.09 ng/mL — ABNORMAL HIGH (ref <0.031)

## 2015-05-20 LAB — HEMOGLOBIN AND HEMATOCRIT, BLOOD
HEMATOCRIT: 35.7 % — AB (ref 39.0–52.0)
Hemoglobin: 11.9 g/dL — ABNORMAL LOW (ref 13.0–17.0)

## 2015-05-20 MED ORDER — LORAZEPAM 2 MG/ML IJ SOLN
1.0000 mg | Freq: Once | INTRAMUSCULAR | Status: AC
  Administered 2015-05-20: 1 mg via INTRAVENOUS

## 2015-05-20 MED ORDER — LORAZEPAM 2 MG/ML IJ SOLN
INTRAMUSCULAR | Status: AC
  Filled 2015-05-20: qty 1

## 2015-05-20 NOTE — Progress Notes (Signed)
Billy [name] ZOX:[id number] DOB: 1931-10-08 DOA: 05/17/2015 PCP: [name], MD  Brief narrative:  81 y/o  Mild dementia pabn diverticulosis on colonoscopy 2004/2010 had non cancerous polyp Has had 2 episodes of hospitalization with GI bleed thought to be diverticular I admitted him in 11/2014-he was to f/u with GI as OP G consutled this admission-underwetn colonoscopy 10/28 confirming diverticulosis and graduated diet. He had a brief unresponsive episode and work up revealed no findings  Past medical history-As per Problem list Chart reviewed as below-   Consultants:  gi  Procedures:  none  Antibiotics:  none   Subjective   Pleasantly confused. No recollection last pm events No further episode No cp nor SOB ROS isn't completely reliable   Objective    Interim History:   Telemetry:    Objective: Filed Vitals:   05/19/15 1415 05/19/15 1431 05/19/15 2138 05/20/15 0557  BP: 134/84 142/91 137/83 117/78  Pulse: 87 100 104 84  Temp:  97.9 F (36.6 C)  97.5 F (36.4 C)  TempSrc:  Oral  Oral  Resp: 15 16    Height:      Weight:      SpO2: 98% 96% 100% 58%    Intake/Output Summary (Last 24 hours) at 05/20/15 0945 Last data filed at 05/19/15 1951  Gross per 24 hour  Intake    200 ml  Output    450 ml  Net   -250 ml    Exam:  General: eomi ncat Cardiovascular: s1 s 2no m/r/g Respiratory: clear Abdomen: soft nt nd  Data Reviewed: Basic Metabolic Panel:  Recent Labs Lab 05/17/15 1525  NA 141  K 3.7  CL 107  CO2 24  GLUCOSE 102*  BUN 14  CREATININE 1.09  CALCIUM 9.2   Liver Function Tests:  Recent Labs Lab 05/17/15 1525  AST 22  ALT 14*  ALKPHOS 46  BILITOT 0.3  PROT 6.6  ALBUMIN 3.0*   No results for input(s): LIPASE, AMYLASE in the last 168 hours. No results for input(s): AMMONIA in the last 168 hours. CBC:  Recent Labs Lab 05/17/15 1525 05/17/15 2240 05/18/15 0625 05/18/15 0932 05/20/15 0038  WBC 8.5   --   --   --   --   NEUTROABS 4.6  --   --   --   --   HGB 12.1*  --   --   --  11.9*  HCT 36.7* 37.7* 38.2* 39.2 35.7*  MCV 87.4  --   --   --   --   PLT 426*  --   --   --   --    Cardiac Enzymes: No results for input(s): CKTOTAL, CKMB, CKMBINDEX, TROPONINI in the last 168 hours. BNP: Invalid input(s): POCBNP CBG:  Recent Labs Lab 05/19/15 2135  GLUCAP 89    Recent Results (from the past 240 hour(s))  C difficile quick scan w PCR reflex     Status: None   Collection Time: 05/18/15  1:55 AM  Result Value Ref Range Status   C Diff antigen NEGATIVE NEGATIVE Final   C Diff toxin NEGATIVE NEGATIVE Final   C Diff interpretation Negative for toxigenic C. difficile  Final     Studies:              All Imaging reviewed and is as per above notation   Scheduled Meds: . donepezil  5 mg Oral QHS  . finasteride  5 mg Oral Daily  . Mesalamine  800 mg Oral  TID  . mirtazapine  7.5 mg Oral QHS  . NIFEdipine  30 mg Oral Daily  . sodium chloride  3 mL Intravenous Q12H   Continuous Infusions: . sodium chloride 50 mL/hr at 05/17/15 2324  . sodium chloride       Assessment/Plan:  1. Diverticulosis causing LGIB-Hb in 12 range-grad dit as per GI.  Started mesalamine 800 tid as per GI.   Hold transfusion for now. Follow GI path report oas OP 2. Episodic vasovagal event-  CB called but recovered after transient confusionEKG performed.  No EKG changes other than sinus tach.  Labs pending.  Monitor on tele today 3. Dementia-mild to moderate-re-start aricept 5mg  /remeron 7.5 when able 4. htn-continue Nifedepine 30 daily-no hypotension  Code Status: full Family Communication: none +.  Have called Neiece a couple of times with no response Disposition Plan:  Inpatient-possible d/c home if felt stable in am  Pleas KochJai Aneisha Skyles, MD  [facility] Pager [phone] 05/20/2015, 9:45 AM

## 2015-05-20 NOTE — Progress Notes (Addendum)
Patient ID: Billy [name], male   DOB: Jun 30, 1932, 81 y.o.   MRN: [id number] [facility]  Billy [name] 81 y.o. Jun 30, 1932   Subjective: Feels ok. Denies rectal bleeding or diarrhea overnight and no BMs recorded.  Objective: Vital signs in last 24 hours: Filed Vitals:   05/20/15 0557  BP: 117/78  Pulse: 84  Temp: 97.5 F (36.4 C)  Resp: 16    Physical Exam: Gen: alert, no acute distress CV: RRR Chest: CTA B Abd: soft, nontender, nondistended, +BS Ext: no edema  Lab Results:  Recent Labs  05/17/15 1525 05/20/15 0941  NA 141 139  K 3.7 3.6  CL 107 104  CO2 24 23  GLUCOSE 102* 79  BUN 14 15  CREATININE 1.09 1.37*  CALCIUM 9.2 8.5*    Recent Labs  05/17/15 1525 05/20/15 0941  AST 22 26  ALT 14* 17  ALKPHOS 46 45  BILITOT 0.3 0.6  PROT 6.6 6.6  ALBUMIN 3.0* 2.7*    Recent Labs  05/17/15 1525  05/20/15 0038 05/20/15 0941  WBC 8.5  --   --  9.9  NEUTROABS 4.6  --   --  7.7  HGB 12.1*  --  11.9* 12.7*  HCT 36.7*  < > 35.7* 37.3*  MCV 87.4  --   --  85.0  PLT 426*  --   --  323  < > = values in this interval not displayed. No results for input(s): LABPROT, INR in the last 72 hours.    Assessment/Plan: Colitis - biopsies pending. No further bleeding. Continue supportive care. Advance diet. No further recs at this time.    Tarius Stangelo C. 05/20/2015, 11:07 AM  Pager [phone]  If no answer or after 5 PM call [phone]

## 2015-05-20 NOTE — Progress Notes (Signed)
RN was called to pt's room by NT. Pt was sitting on BSC (after soiling bed) non-responsive and pulseless. After multiple attempts using sternum rub to receive a response without success, Code Blue was initiated and pt was then transferred to bed and set up for ACLS protocol. Patient then recovered without any intervention including CPR and medications. MD was notified and placed new orders for telemetry monitoring. Orders implemented and continued to monitor pt.

## 2015-05-21 ENCOUNTER — Observation Stay (HOSPITAL_COMMUNITY): Payer: Medicare Other

## 2015-05-21 DIAGNOSIS — K922 Gastrointestinal hemorrhage, unspecified: Secondary | ICD-10-CM | POA: Diagnosis not present

## 2015-05-21 LAB — TYPE AND SCREEN
ABO/RH(D): A POS
Antibody Screen: NEGATIVE
Unit division: 0
Unit division: 0

## 2015-05-21 MED ORDER — DEXTROSE-NACL 5-0.9 % IV SOLN
INTRAVENOUS | Status: DC
  Administered 2015-05-21 – 2015-05-22 (×2): via INTRAVENOUS
  Administered 2015-05-23: 1000 mL via INTRAVENOUS

## 2015-05-21 MED ORDER — HYDRALAZINE HCL 10 MG PO TABS
10.0000 mg | ORAL_TABLET | Freq: Three times a day (TID) | ORAL | Status: DC
  Administered 2015-05-22 – 2015-05-24 (×4): 10 mg via ORAL
  Filled 2015-05-21 (×4): qty 1

## 2015-05-21 MED ORDER — MESALAMINE 400 MG PO CPDR
800.0000 mg | DELAYED_RELEASE_CAPSULE | Freq: Three times a day (TID) | ORAL | Status: DC
Start: 2015-05-21 — End: 2015-08-09

## 2015-05-21 NOTE — Progress Notes (Addendum)
Nsg Discharge Note  Admit Date:  05/17/2015 Discharge date: 05/21/2015   Billy [name] to be D/C'd Home per MD order.  AVS completed.  Copy for chart, and copy for patient signed, and dated. Patient/caregiver able to verbalize understanding.  Discharge Medication:   Medication List    TAKE these medications        docusate sodium 100 MG capsule  Commonly known as:  COLACE  Take 1 capsule (100 mg total) by mouth 2 (two) times daily.     donepezil 5 MG tablet  Commonly known as:  ARICEPT  Take 1 tablet by mouth at bedtime.     finasteride 5 MG tablet  Commonly known as:  PROSCAR  Take 1 tablet by mouth daily.     hydrocortisone 25 MG suppository  Commonly known as:  ANUSOL-HC  Place 1 suppository (25 mg total) rectally 2 (two) times daily.     Mesalamine 400 MG Cpdr DR capsule  Commonly known as:  ASACOL  Take 2 capsules (800 mg total) by mouth 3 (three) times daily.     mirtazapine 7.5 MG tablet  Commonly known as:  REMERON  Take 1 tablet by mouth at bedtime.     NIFEdipine 30 MG 24 hr tablet  Commonly known as:  PROCARDIA-XL/ADALAT-CC/NIFEDICAL-XL  Take 30 mg by mouth daily.        Discharge Assessment: Filed Vitals:   05/21/15 1306  BP: 149/81  Pulse: 112  Temp: 98.5 F (36.9 C)  Resp: 16   Skin clean, dry and intact without evidence of skin break down, no evidence of skin tears noted. IV catheter discontinued intact. Site without signs and symptoms of complications - no redness or edema noted at insertion site, patient denies c/o pain - only slight tenderness at site.  Dressing with slight pressure applied.  D/c Instructions-Education: Discharge instructions given to patient/family with verbalized understanding. D/c education completed with patient/family including follow up instructions, medication list, d/c activities limitations if indicated, with other d/c instructions as indicated by MD - patient able to verbalize understanding, all questions  fully answered. Patient instructed to return to ED, call 911, or call MD for any changes in condition.  Patient escorted via WC, and D/C home via private auto.  DISCHARGE CANCELLED PER MD ORDER D/T COMPLICATIONS [name]  Billy FlamingVicki L Mickala Laton, RN 05/21/2015 3:18 PM

## 2015-05-21 NOTE — Progress Notes (Signed)
Patient noted to be unresponsive after standing up Eyes "rolled back in the head" Has never happened priro to 2 night ago awakneed after place din anti-trendelenburg Initial BP was 60/40 Resolved to 90/60 with regaining consiousness.  Likely vagal event vs orthostasis R/o CVA Get EEG Will get orthostatics as well Cancel d/c home Family updated  Billy KochJai Dayron Odland, MD [facility] [phone]

## 2015-05-21 NOTE — Progress Notes (Signed)
Patient had episode of chest pain did  12 lead EKG shows sinus tachycardia, VS taken placed on oxygen, then few minutes resolved on its the chest pain. Patient is NPO for now. Paged K. SchorrNP with order to start IVF and made aware of patient VS Hr  Slightly tachy. Patient is kind of restless and confused oriented to self. Incontinent of urine pt. Changed and was given for pain in his left arm, left arm noted some limited movement due to pain. Given pain med will continue to monitor patient.

## 2015-05-21 NOTE — Discharge Summary (Signed)
Physician Discharge Summary  Billy [name] AVW:[id number] DOB: [birth year] DOA: 05/17/2015  PCP: [name], MD  Admit date: 05/17/2015 Discharge date: 05/21/2015  Time spent: 25 minutes  Recommendations for Outpatient Follow-up:  1. Follow-up on pathology from colonoscopy to rule out Crohn's or ulcerative colitis 2. Continue mesalamine 800 3 times a day until seen by GI 3. Monitor blood pressures and other indices as an outpatient 4. Consider CBC plus Chem-7 in about one week 5. Consider goals of care discussions given some moderate dementia  6.   Discharge Diagnoses:  Active Problems:   Lower GI bleed   Discharge Condition: Fair  Diet recommendation: Low fiber soft diet for 1-2 weeks  Filed Weights   05/17/15 2127 05/19/15 1302  Weight: 54.9 kg (121 lb 0.5 oz) 54.885 kg (121 lb)    History of present illness:  81 y/o  Mild dementia pbn diverticulosis on colonoscopy 2004/2010 had non cancerous polyp Has had 2 episodes of hospitalization with GI bleed thought to be diverticular I admitted him in 11/2014-he was to f/u with GI as OP G consutled this admission-underwent colonoscopy 10/28 confirming diverticulosis and graduated diet. He had a brief unresponsive episode and work up revealed no findings  Hospital Course:   1. Diverticulosis causing LGIB-Hb in 12 range-grad dit as per GI. Started mesalamine 800 tid as per GI. Hold transfusion for now. Follow GI path report as OP 2. Episodic vasovagal event- CB called but recovered after transient confusion EKG performed. No EKG changes other than sinus tach. Labs pending. Patient was monitored for next today and had no further issues 3. Dementia-mild to moderate-re-start aricept  /remeron 7.5 when able 4. htn-continue Nifedepine 30 daily-no hypotension  Procedures: Colonoscopy  IMPRESSIONS: 1. mild to moderate colitis throughout status post biopsy seemingly sparing the cecum and proximal ascending  and most distal rectum 2. primarily sigmoid small diverticuli with few scattered throughout the remainder of the colon 3. transverse and descending small polyp status post cold biopsy 4. normal terminal ileum 5. otherwise within normal limits as above with cecum and parts of ascending transverse and sigmoid not well seen   Gastroenterology Dr. Ewing Schlein   Discharge Exam: Filed Vitals:   05/21/15 0516  BP: 150/92  Pulse: 102  Temp: 98.5 F (36.9 C)  Resp: 18   Doing fair. Nursing reports no dark or tarry stools hemoglobin pretty stable Tolerating diet A little bit confused but seems better in the morning and then sundowners in the evenings  General: Alert pleasant oriented no apparent distress Cardiovascular: S1-S2 no murmur rub or gallop Respiratory: Clinically clear no added sound  Discharge Instructions   Discharge Instructions    Diet - low sodium heart healthy    Complete by:  As directed      Discharge instructions    Complete by:  As directed   Please try to eat a soft diet without too much fiber for the next 1-2 weeks You were diagnosed with diverticulosis of the stomach which is a condition where in the colon strains and sometimes can bleed from the straining so a soft diet is imperative Please follow-up with gastroenterology as an outpatient as this started him on a medication which may need to continue or not depending on the results of the pathology Follow-up with primary care physician as an outpatient There've been no other changes to his medication     Increase activity slowly    Complete by:  As directed  Current Discharge Medication List    START taking these medications   Details  Mesalamine (ASACOL) 400 MG CPDR DR capsule Take 2 capsules (800 mg total) by mouth 3 (three) times daily. Qty: 180 capsule, Refills: 0      CONTINUE these medications which have NOT CHANGED   Details  donepezil (ARICEPT) 5 MG tablet Take 1 tablet by mouth at  bedtime. Refills: 11    finasteride (PROSCAR) 5 MG tablet Take 1 tablet by mouth daily. Refills: 11    mirtazapine (REMERON) 7.5 MG tablet Take 1 tablet by mouth at bedtime. Refills: 11    NIFEdipine (PROCARDIA-XL/ADALAT-CC/NIFEDICAL-XL) 30 MG 24 hr tablet Take 30 mg by mouth daily. Refills: 11    docusate sodium (COLACE) 100 MG capsule Take 1 capsule (100 mg total) by mouth 2 (two) times daily. Qty: 10 capsule, Refills: 0    hydrocortisone (ANUSOL-HC) 25 MG suppository Place 1 suppository (25 mg total) rectally 2 (two) times daily. Qty: 30 suppository, Refills: 0       No Known Allergies    The results of significant diagnostics from this hospitalization (including imaging, microbiology, ancillary and laboratory) are listed below for reference.    Significant Diagnostic Studies: No results found.  Microbiology: Recent Results (from the past 240 hour(s))  C difficile quick scan w PCR reflex     Status: None   Collection Time: 05/18/15  1:55 AM  Result Value Ref Range Status   C Diff antigen NEGATIVE NEGATIVE Final   C Diff toxin NEGATIVE NEGATIVE Final   C Diff interpretation Negative for toxigenic C. difficile  Final     Labs: Basic Metabolic Panel:  Recent Labs Lab 05/17/15 1525 05/20/15 0941  NA 141 139  K 3.7 3.6  CL 107 104  CO2 24 23  GLUCOSE 102* 79  BUN 14 15  CREATININE 1.09 1.37*  CALCIUM 9.2 8.5*   Liver Function Tests:  Recent Labs Lab 05/17/15 1525 05/20/15 0941  AST 22 26  ALT 14* 17  ALKPHOS 46 45  BILITOT 0.3 0.6  PROT 6.6 6.6  ALBUMIN 3.0* 2.7*   No results for input(s): LIPASE, AMYLASE in the last 168 hours. No results for input(s): AMMONIA in the last 168 hours. CBC:  Recent Labs Lab 05/17/15 1525 05/17/15 2240 05/18/15 0625 05/18/15 0932 05/20/15 0038 05/20/15 0941  WBC 8.5  --   --   --   --  9.9  NEUTROABS 4.6  --   --   --   --  7.7  HGB 12.1*  --   --   --  11.9* 12.7*  HCT 36.7* 37.7* 38.2* 39.2 35.7* 37.3*   MCV 87.4  --   --   --   --  85.0  PLT 426*  --   --   --   --  323   Cardiac Enzymes:  Recent Labs Lab 05/20/15 0941 05/20/15 1338 05/20/15 1937  TROPONINI 0.08* 0.08* 0.09*   BNP: BNP (last 3 results) No results for input(s): BNP in the last 8760 hours.  ProBNP (last 3 results) No results for input(s): PROBNP in the last 8760 hours.  CBG:  Recent Labs Lab 05/19/15 2135  GLUCAP 89       Signed:  Rhetta MuraSAMTANI, JAI-GURMUKH  [facility] 05/21/2015, 9:41 AM

## 2015-05-22 ENCOUNTER — Observation Stay (HOSPITAL_COMMUNITY): Payer: Medicare Other

## 2015-05-22 ENCOUNTER — Encounter (HOSPITAL_COMMUNITY): Payer: Self-pay | Admitting: Gastroenterology

## 2015-05-22 DIAGNOSIS — M24521 Contracture, right elbow: Secondary | ICD-10-CM | POA: Diagnosis present

## 2015-05-22 DIAGNOSIS — Z79899 Other long term (current) drug therapy: Secondary | ICD-10-CM | POA: Diagnosis not present

## 2015-05-22 DIAGNOSIS — G9341 Metabolic encephalopathy: Secondary | ICD-10-CM | POA: Diagnosis not present

## 2015-05-22 DIAGNOSIS — R55 Syncope and collapse: Secondary | ICD-10-CM

## 2015-05-22 DIAGNOSIS — I82A11 Acute embolism and thrombosis of right axillary vein: Secondary | ICD-10-CM | POA: Diagnosis present

## 2015-05-22 DIAGNOSIS — I82621 Acute embolism and thrombosis of deep veins of right upper extremity: Secondary | ICD-10-CM | POA: Diagnosis present

## 2015-05-22 DIAGNOSIS — G309 Alzheimer's disease, unspecified: Secondary | ICD-10-CM | POA: Diagnosis not present

## 2015-05-22 DIAGNOSIS — E876 Hypokalemia: Secondary | ICD-10-CM | POA: Diagnosis not present

## 2015-05-22 DIAGNOSIS — W19XXXA Unspecified fall, initial encounter: Secondary | ICD-10-CM | POA: Diagnosis not present

## 2015-05-22 DIAGNOSIS — T380X5A Adverse effect of glucocorticoids and synthetic analogues, initial encounter: Secondary | ICD-10-CM | POA: Diagnosis present

## 2015-05-22 DIAGNOSIS — M24561 Contracture, right knee: Secondary | ICD-10-CM | POA: Diagnosis present

## 2015-05-22 DIAGNOSIS — E46 Unspecified protein-calorie malnutrition: Secondary | ICD-10-CM | POA: Diagnosis present

## 2015-05-22 DIAGNOSIS — E785 Hyperlipidemia, unspecified: Secondary | ICD-10-CM | POA: Diagnosis present

## 2015-05-22 DIAGNOSIS — K625 Hemorrhage of anus and rectum: Secondary | ICD-10-CM | POA: Diagnosis present

## 2015-05-22 DIAGNOSIS — K529 Noninfective gastroenteritis and colitis, unspecified: Secondary | ICD-10-CM | POA: Diagnosis not present

## 2015-05-22 DIAGNOSIS — I998 Other disorder of circulatory system: Secondary | ICD-10-CM | POA: Diagnosis not present

## 2015-05-22 DIAGNOSIS — I82401 Acute embolism and thrombosis of unspecified deep veins of right lower extremity: Secondary | ICD-10-CM | POA: Diagnosis not present

## 2015-05-22 DIAGNOSIS — F0390 Unspecified dementia without behavioral disturbance: Secondary | ICD-10-CM | POA: Diagnosis not present

## 2015-05-22 DIAGNOSIS — E43 Unspecified severe protein-calorie malnutrition: Secondary | ICD-10-CM | POA: Diagnosis present

## 2015-05-22 DIAGNOSIS — Z87891 Personal history of nicotine dependence: Secondary | ICD-10-CM | POA: Diagnosis not present

## 2015-05-22 DIAGNOSIS — K922 Gastrointestinal hemorrhage, unspecified: Secondary | ICD-10-CM | POA: Diagnosis not present

## 2015-05-22 DIAGNOSIS — I808 Phlebitis and thrombophlebitis of other sites: Secondary | ICD-10-CM | POA: Diagnosis not present

## 2015-05-22 DIAGNOSIS — F0280 Dementia in other diseases classified elsewhere without behavioral disturbance: Secondary | ICD-10-CM | POA: Diagnosis not present

## 2015-05-22 DIAGNOSIS — Z681 Body mass index (BMI) 19 or less, adult: Secondary | ICD-10-CM | POA: Diagnosis not present

## 2015-05-22 DIAGNOSIS — M79601 Pain in right arm: Secondary | ICD-10-CM | POA: Diagnosis not present

## 2015-05-22 DIAGNOSIS — K5190 Ulcerative colitis, unspecified, without complications: Secondary | ICD-10-CM | POA: Diagnosis present

## 2015-05-22 DIAGNOSIS — I10 Essential (primary) hypertension: Secondary | ICD-10-CM | POA: Diagnosis not present

## 2015-05-22 DIAGNOSIS — N400 Enlarged prostate without lower urinary tract symptoms: Secondary | ICD-10-CM | POA: Diagnosis present

## 2015-05-22 DIAGNOSIS — E860 Dehydration: Secondary | ICD-10-CM | POA: Diagnosis present

## 2015-05-22 DIAGNOSIS — K5791 Diverticulosis of intestine, part unspecified, without perforation or abscess with bleeding: Secondary | ICD-10-CM | POA: Diagnosis present

## 2015-05-22 MED ORDER — SODIUM CHLORIDE 0.9 % IV BOLUS (SEPSIS)
500.0000 mL | Freq: Once | INTRAVENOUS | Status: AC
  Administered 2015-05-22: 500 mL via INTRAVENOUS

## 2015-05-22 MED ORDER — LORAZEPAM 2 MG/ML IJ SOLN
INTRAMUSCULAR | Status: AC
  Administered 2015-05-22: 2 mg
  Filled 2015-05-22: qty 1

## 2015-05-22 NOTE — Progress Notes (Signed)
Patient's biopsies returned and show colitis not infectious and I would recommend unless doing much better on 5-ASA to begin prednisone 20 mg and have his primary GI doctor Laural BenesJohnson follow him up in a few weeks after discharge and then adjust prednisone as needed

## 2015-05-22 NOTE — Evaluation (Signed)
Occupational Therapy Evaluation Patient Details Name: Billy [name] MRN: [id number] DOB: 15-Feb-1932 Today's Date: 05/22/2015    History of Present Illness Billy [name] is a 81 y.o. male with h/o Alheimer's dementia presenting with rectal bleeding for the last 3 weeks worsening in the last 2 days.   Clinical Impression   Pt admitted to hospital due to reason stated above. Pt currently with functional limitiations due to the deficits listed below (see OT problem list). According to pt's caregiver pt was independent with ADLs. Pt currently requires max to total assistance for ADLs. Pt's caregiver reports she with pt at night due to pt sundowning and caregiver employed full time during the day. Pt with consistent R side pain, demonstrated RUE and RLE guarding. Pt will benefit from skilled OT to maximize his independence and safety with ADLs and balance to allow discharge to venue listed below.    Follow Up Recommendations  SNF    Equipment Recommendations  None recommended by OT    Recommendations for Other Services       Precautions / Restrictions Precautions Precautions: Fall Precaution Comments: orthostatics; c/o of Rt UE and LE pain Restrictions Weight Bearing Restrictions: No      Mobility Bed Mobility Overal bed mobility: Needs Assistance Bed Mobility: Rolling Rolling: Min assist         General bed mobility comments: cues for sequence and safety  Transfers                 General transfer comment: Unable to transfer due to othorstatics and Rt side pain    Balance                                            ADL Overall ADL's : Needs assistance/impaired Eating/Feeding: NPO                                     General ADL Comments: Pt currently max to total assist for ADL completion.     Vision     Perception     Praxis      Pertinent Vitals/Pain Pain Assessment: Faces Faces Pain Scale: Hurts even  more Pain Location: Rt UE and LE Pain Descriptors / Indicators: Grimacing;Guarding Pain Intervention(s): Monitored during session;Repositioned     Hand Dominance Right   Extremity/Trunk Assessment Upper Extremity Assessment Upper Extremity Assessment: Generalized weakness;RUE deficits/detail RUE Deficits / Details: Unable to actively flex at elbow and was seen guarding R UE RUE: Unable to fully assess due to pain RUE Coordination: decreased gross motor   Lower Extremity Assessment Lower Extremity Assessment: Defer to PT evaluation       Communication Communication Communication: No difficulties   Cognition Arousal/Alertness: Awake/alert Behavior During Therapy: WFL for tasks assessed/performed Overall Cognitive Status: History of cognitive impairments - at baseline Area of Impairment: Orientation;Following commands;Awareness Orientation Level: Disoriented to;Place;Time;Situation     Following Commands: Follows one step commands inconsistently   Awareness: Anticipatory   General Comments: Pt shown no awareness of stimulus being applied to RLE, stating "those are pants" when asked where he was being touched   General Comments    Per PT note pt was set up to supervision for ADLs PTA, however according to pt's caregiver pt was independent with ADLs.    Exercises  Shoulder Instructions      Home Living Family/patient expects to be discharged to:: Private residence Living Arrangements: Other relatives (caregiver) Available Help at Discharge: Family;Available PRN/intermittently Type of Home: House Home Access: Level entry     Home Layout: One level     Bathroom Shower/Tub: Tub/shower unit Shower/tub characteristics: Engineer, building services: Standard Bathroom Accessibility: Yes How Accessible: Accessible via walker Home Equipment: Walker - 2 wheels;Cane - single point;Shower seat;Bedside commode   Additional Comments: Information recieved from pt's  caregiver. Pt's caregiver reports she stays with pt only at night due to working at daytime.      Prior Functioning/Environment Level of Independence: Independent        Comments: Per pt's caregiver pt was independent with ADLs PTA and only required supervision at night.    OT Diagnosis: Generalized weakness;Cognitive deficits;Acute pain   OT Problem List: Decreased strength;Decreased range of motion;Impaired balance (sitting and/or standing);Decreased activity tolerance;Pain   OT Treatment/Interventions: Self-care/ADL training;Therapeutic exercise;Therapeutic activities;Cognitive remediation/compensation;Patient/family education;Balance training    OT Goals(Current goals can be found in the care plan section) Acute Rehab OT Goals Patient Stated Goal: none stated OT Goal Formulation: With patient Time For Goal Achievement: 06/05/15 Potential to Achieve Goals: Fair ADL Goals Pt Will Perform Lower Body Dressing: with mod assist;sitting/lateral leans;sit to/from stand Pt Will Transfer to Toilet: with max assist;stand pivot transfer;bedside commode Pt Will Perform Toileting - Clothing Manipulation and hygiene: with max assist;sitting/lateral leans  OT Frequency: Min 2X/week   Barriers to D/C:            Co-evaluation              End of Session Nurse Communication: Other (comment) (condom catheter replacement)  Activity Tolerance: Patient tolerated treatment well;Patient limited by pain Patient left: in bed;with call bell/phone within reach;with bed alarm set   Time: 1610-9604 OT Time Calculation (min): 33 min Charges:  OT General Charges $OT Visit: 1 Procedure OT Evaluation $Initial OT Evaluation Tier I: 1 Procedure OT Treatments $Self Care/Home Management : 8-22 mins G-Codes: OT G-codes **NOT FOR INPATIENT CLASS** Functional Assessment Tool Used: clinical judgement Functional Limitation: Self care Self Care Current Status (V4098): At least 80 percent but less  than 100 percent impaired, limited or restricted Self Care Goal Status (J1914): At least 80 percent but less than 100 percent impaired, limited or restricted Self Care Discharge Status [phone]): At least 80 percent but less than 100 percent impaired, limited or restricted  Smiley Houseman 05/22/2015, 11:01 AM

## 2015-05-22 NOTE — Procedures (Signed)
ELECTROENCEPHALOGRAM REPORT  Patient: Billy [name]       Room #: 1L245W16 EEG No. ID: [id number] Age: 81 y.o.        Sex: male Referring Physician: Landis GandySamtani, J Report Date:  05/22/2015        Interpreting Physician: Billy [name],Billy [name]  History: Billy KiefSylvester [name] is an 81 y.o. male with recurrent spells of loss of consciousness, most likely manifestations of syncope associated with hypotension.  Indications for study:  Rule out encephalopathy; rule out seizure disorder.  Technique: This is an 18 channel routine scalp EEG performed at the bedside with bipolar and monopolar montages arranged in accordance to the international 10/20 system of electrode placement.   Description: This EEG recording was performed during wakefulness. Patient was noted to be confused and the time of this study. Predominant cerebral activity consisted of low to moderate amplitude diffuse mixed irregular delta and theta activity. Fairly frequent occurrences of generalized somewhat triphasic appearing sharp waves were recorded. Photic stimulation and hyperventilation were not performed. No frank epileptiform discharges were recorded.  Interpretation: This EEG is abnormal with moderately severe generalized continuous nonspecific slowing of cerebral activity consistent with moderately severe encephalopathic state. Significance of triphasic sharp waves, which are most often associated with metabolic encephalopathy, is unclear. No frank seizure discharges were recorded.   Billy [name] M.D. [facility] [phone]

## 2015-05-22 NOTE — Progress Notes (Signed)
Billy [name] YIF:[id number] DOB: 1931/11/02 DOA: 05/17/2015 PCP: Wenda Low, MD  Brief narrative:  81 y/o  Mild dementia pabn diverticulosis on colonoscopy 2004/2010 had non cancerous polyp Has had 2 episodes of hospitalization with GI bleed thought to be diverticular I admitted him in 11/2014-he was to f/u with GI as OP G consutled this admission-underwetn colonoscopy 10/28 confirming diverticulosis and graduated diet. He had a brief unresponsive episode and work up revealed no findings  Past medical history-As per Problem list Chart reviewed as below-   Consultants:  gi  Procedures:  none  Antibiotics:  none   Subjective   no further episodic loc EEG done Patient agitated this am with RN and combative and needed Ativan therapy rec's Macy Refusing exam today    Objective    Interim History:   Telemetry:    Objective: Filed Vitals:   05/22/15 0520 05/22/15 0900 05/22/15 0937 05/22/15 1427  BP: 75/47 160/82 136/80 148/92  Pulse: 98   109  Temp: 98.9 F (37.2 C)   98.8 F (37.1 C)  TempSrc: Oral   Oral  Resp: 18   16  Height:      Weight:      SpO2: 100%   100%    Intake/Output Summary (Last 24 hours) at 05/22/15 1443 Last data filed at 05/22/15 0820  Gross per 24 hour  Intake      0 ml  Output    200 ml  Net   -200 ml    Exam:  Refusing exam  Data Reviewed: Basic Metabolic Panel:  Recent Labs Lab 05/17/15 1525 05/20/15 0941  NA 141 139  K 3.7 3.6  CL 107 104  CO2 24 23  GLUCOSE 102* 79  BUN 14 15  CREATININE 1.09 1.37*  CALCIUM 9.2 8.5*   Liver Function Tests:  Recent Labs Lab 05/17/15 1525 05/20/15 0941  AST 22 26  ALT 14* 17  ALKPHOS 46 45  BILITOT 0.3 0.6  PROT 6.6 6.6  ALBUMIN 3.0* 2.7*   No results for input(s): LIPASE, AMYLASE in the last 168 hours. No results for input(s): AMMONIA in the last 168 hours. CBC:  Recent Labs Lab 05/17/15 1525 05/17/15 2240 05/18/15 0625 05/18/15 0932  05/20/15 0038 05/20/15 0941  WBC 8.5  --   --   --   --  9.9  NEUTROABS 4.6  --   --   --   --  7.7  HGB 12.1*  --   --   --  11.9* 12.7*  HCT 36.7* 37.7* 38.2* 39.2 35.7* 37.3*  MCV 87.4  --   --   --   --  85.0  PLT 426*  --   --   --   --  323   Cardiac Enzymes:  Recent Labs Lab 05/20/15 0941 05/20/15 1338 05/20/15 1937  TROPONINI 0.08* 0.08* 0.09*   BNP: Invalid input(s): POCBNP CBG:  Recent Labs Lab 05/19/15 2135  GLUCAP 89    Recent Results (from the past 240 hour(s))  C difficile quick scan w PCR reflex     Status: None   Collection Time: 05/18/15  1:55 AM  Result Value Ref Range Status   C Diff antigen NEGATIVE NEGATIVE Final   C Diff toxin NEGATIVE NEGATIVE Final   C Diff interpretation Negative for toxigenic C. difficile  Final     Studies:              All Imaging reviewed and is as per above notation  Scheduled Meds: . donepezil  5 mg Oral QHS  . finasteride  5 mg Oral Daily  . hydrALAZINE  10 mg Oral 3 times per day  . LORazepam      . Mesalamine  800 mg Oral TID  . mirtazapine  7.5 mg Oral QHS  . sodium chloride  3 mL Intravenous Q12H   Continuous Infusions: . sodium chloride    . dextrose 5 % and 0.9% NaCl 50 mL/hr at 05/21/15 2200     Assessment/Plan:  1. LOC episode-vaso vagal like appearance, drop in pulse rate and improved with posture. EEG concerning for toxic met encephalopathy. CT head neg.  If orthostatics are normal [we held NOdal agent like imdur] would d/ to snf in am 2. Diverticulosis causing LGIB-Hb in 12 range-grad dit as per GI.  Started mesalamine 800 tid as per GI.   Hold transfusion for now. Follow GI path report oas OP 3. Episodic vasovagal event-  See above 4. [place] to moderate-re-start aricept 40m /remeron 7.5 when able 5. htn-continue Nifedepine 30 daily-no hypotension  Code Status: full Family Communication: none +.   Disposition Plan:  Inpatient-possible d/cSNF in am  JVerneita Griffes MD  [facility] Pager [phone]/31/2016, 2:43 PM

## 2015-05-22 NOTE — Progress Notes (Signed)
OT NOTE  Pt noted to have strong odor and dark color to urine during session. Pt guarding and reports pain throughout R side of body. Pt guarding R UE and maintains a elbow flexed position with R wrist drop. Pt tolerates PROM wrist to neutral with OT talking to distract patient. Pt consistent throughout entire session with R side guarding.   RN notified of pain and odor to urine.   Mateo FlowJones, Brynn   [name] Pager: [phone] Office: [phone] .

## 2015-05-22 NOTE — Progress Notes (Signed)
Bedside EEG completed, results pending. 

## 2015-05-23 NOTE — [place] FL2 (Signed)
[place] MEDICAID FL2 LEVEL OF CARE SCREENING TOOL     IDENTIFICATION  Patient Name: Billy KiefSylvester Gagan Birthdate: 1931/11/03 Sex: male Admission Date (Current Location): 05/17/2015  [facility] [place]:     Facility and Address:  [facility] [place], [address], [place], [place] [address]      Provider Number: [id number]  Attending Physician Name and Address:  Rhetta MuraJai-Gurmukh Samtani, MD  Relative Name and Phone Number:       Current Level of Care: Hospital Recommended Level of Care: Skilled Nursing Facility Prior Approval Number:    Date Approved/Denied:   PASRR Number: [id number]  [facility](SSN  [phone])  Discharge Plan: SNF    Current Diagnoses: Patient Active Problem List   Diagnosis Date Noted  . Lower GI bleed 05/17/2015  . Rectal bleeding 11/27/2014  . Anemia 11/27/2014  . Alzheimer's disease 11/27/2014  . Diarrhea 11/23/2014  . Normocytic anemia 11/23/2014  . Protein-calorie malnutrition, severe (HCC) 11/23/2014  . GI bleed 11/21/2014  . Diverticulosis 11/21/2014  . Leukocytosis 11/21/2014  . HTN (hypertension) 11/21/2014  . Dementia 11/21/2014  . BPH (benign prostatic hyperplasia) 11/21/2014    Orientation ACTIVITIES/SOCIAL BLADDER RESPIRATION    Self    Incontinent Normal  BEHAVIORAL SYMPTOMS/MOOD NEUROLOGICAL BOWEL NUTRITION STATUS      Incontinent Diet (Heart )  PHYSICIAN VISITS COMMUNICATION OF NEEDS Height & Weight Skin    Verbally 5\' 9"  (175.3 cm) 127 lbs. Normal          AMBULATORY STATUS RESPIRATION    Assist extensive Normal      Personal Care Assistance Level of Assistance  Bathing, Feeding, Dressing Bathing Assistance: Maximum assistance Feeding assistance: Maximum assistance Dressing Assistance: Maximum assistance      Functional Limitations Info                SPECIAL CARE FACTORS FREQUENCY  PT (By licensed PT), OT (By licensed OT)                   Additional Factors Info  Code  Status, Allergies Code Status Info: FULL CODE  Allergies Info: NKA            Current Medications (05/23/2015): Current Facility-Administered Medications  Medication Dose Route Frequency Provider Last Rate Last Dose  . 0.9 %  sodium chloride infusion   Intravenous Continuous Vida RiggerMarc Magod, MD   0  at 05/18/15 1700  . acetaminophen (TYLENOL) tablet 650 mg  650 mg Oral Q6H PRN Carron CurieAli Hijazi, MD       Or  . acetaminophen (TYLENOL) suppository 650 mg  650 mg Rectal Q6H PRN Carron CurieAli Hijazi, MD      . dextrose 5 %-0.9 % sodium chloride infusion   Intravenous Continuous Roma KayserKatherine P Schorr, NP 50 mL/hr at 05/22/15 2138    . donepezil (ARICEPT) tablet 5 mg  5 mg Oral QHS Carron CurieAli Hijazi, MD   5 mg at 05/21/15 2212  . finasteride (PROSCAR) tablet 5 mg  5 mg Oral Daily Carron CurieAli Hijazi, MD   5 mg at 05/23/15 1207  . hydrALAZINE (APRESOLINE) tablet 10 mg  10 mg Oral 3 times per day Rhetta MuraJai-Gurmukh Samtani, MD   10 mg at 05/22/15 0441  . HYDROcodone-acetaminophen (NORCO/VICODIN) 5-325 MG per tablet 1-2 tablet  1-2 tablet Oral Q4H PRN Carron CurieAli Hijazi, MD   2 tablet at 05/22/15 1001  . Mesalamine (ASACOL) DR capsule 800 mg  800 mg Oral TID Vida RiggerMarc Magod, MD   800 mg at 05/23/15 1208  .  mirtazapine (REMERON) tablet 7.5 mg  7.5 mg Oral QHS Carron Curie, MD   7.5 mg at 05/21/15 2212  . ondansetron (ZOFRAN) tablet 4 mg  4 mg Oral Q6H PRN Carron Curie, MD       Or  . ondansetron [facility]) injection 4 mg  4 mg Intravenous Q6H PRN Carron Curie, MD      . sodium chloride 0.9 % injection 3 mL  3 mL Intravenous Q12H Carron Curie, MD   10 mL at 05/22/15 2155   Do not use this list as official medication orders. Please verify with discharge summary.  Discharge Medications:   Medication List    TAKE these medications        docusate sodium 100 MG capsule  Commonly known as:  COLACE  Take 1 capsule (100 mg total) by mouth 2 (two) times daily.     donepezil 5 MG tablet  Commonly known as:  ARICEPT  Take 1 tablet by mouth at bedtime.      finasteride 5 MG tablet  Commonly known as:  PROSCAR  Take 1 tablet by mouth daily.     hydrocortisone 25 MG suppository  Commonly known as:  ANUSOL-HC  Place 1 suppository (25 mg total) rectally 2 (two) times daily.     Mesalamine 400 MG Cpdr DR capsule  Commonly known as:  ASACOL  Take 2 capsules (800 mg total) by mouth 3 (three) times daily.     mirtazapine 7.5 MG tablet  Commonly known as:  REMERON  Take 1 tablet by mouth at bedtime.     NIFEdipine 30 MG 24 hr tablet  Commonly known as:  PROCARDIA-XL/ADALAT-CC/NIFEDICAL-XL  Take 30 mg by mouth daily.        Relevant Imaging Results:  Relevant Lab Results:  Recent Labs    Additional Information    Shaquinta Peruski, LCSW

## 2015-05-23 NOTE — Progress Notes (Signed)
Billy [name] FIE:[id number] DOB: [birth year] DOA: 05/17/2015 PCP: Billy Low, MD  Brief narrative:  81 y/o  Mild dementia pabn diverticulosis on colonoscopy 2004/2010 had non cancerous polyp Has had 2 episodes of hospitalization with GI bleed thought to be diverticular I admitted him in 11/2014-he was to f/u with GI as OP G consutled this admission-underwent colonoscopy 10/28 confirming diverticulosis and graduated diet. He had a brief unresponsive episode and work up revealed no findings   Past medical history-As per Problem list Chart reviewed as below-   Consultants:  gi  Procedures:  none  Antibiotics:  none   Subjective   no further episodic loc Still agitated Has significant R arm pain Unable to extend arm on R side Xrays were neg   Objective    Interim History:   Telemetry:    Objective: Filed Vitals:   05/22/15 1810 05/22/15 1811 05/22/15 2151 05/23/15 0521  BP: 126/78 128/79 137/83 132/78  Pulse: 109 107 112 117  Temp:   97.6 F (36.4 C) 98.3 F (36.8 C)  TempSrc:   Oral Oral  Resp:   22 20  Height:      Weight:      SpO2: 100% 100% 99% 99%    Intake/Output Summary (Last 24 hours) at 05/23/15 1300 Last data filed at 05/23/15 1000  Gross per 24 hour  Intake    857 ml  Output    700 ml  Net    157 ml    Exam: eomi ncat oriented Cannot extend Arm-tender in belly of forearm abd soft nt nd  No le edema  Data Reviewed: Basic Metabolic Panel:  Recent Labs Lab 05/17/15 1525 05/20/15 0941  NA 141 139  K 3.7 3.6  CL 107 104  CO2 24 23  GLUCOSE 102* 79  BUN 14 15  CREATININE 1.09 1.37*  CALCIUM 9.2 8.5*   Liver Function Tests:  Recent Labs Lab 05/17/15 1525 05/20/15 0941  AST 22 26  ALT 14* 17  ALKPHOS 46 45  BILITOT 0.3 0.6  PROT 6.6 6.6  ALBUMIN 3.0* 2.7*   No results for input(s): LIPASE, AMYLASE in the last 168 hours. No results for input(s): AMMONIA in the last 168 hours. CBC:  Recent Labs Lab  05/17/15 1525 05/17/15 2240 05/18/15 0625 05/18/15 0932 05/20/15 0038 05/20/15 0941  WBC 8.5  --   --   --   --  9.9  NEUTROABS 4.6  --   --   --   --  7.7  HGB 12.1*  --   --   --  11.9* 12.7*  HCT 36.7* 37.7* 38.2* 39.2 35.7* 37.3*  MCV 87.4  --   --   --   --  85.0  PLT 426*  --   --   --   --  323   Cardiac Enzymes:  Recent Labs Lab 05/20/15 0941 05/20/15 1338 05/20/15 1937  TROPONINI 0.08* 0.08* 0.09*   BNP: Invalid input(s): POCBNP CBG:  Recent Labs Lab 05/19/15 2135  GLUCAP 89    Recent Results (from the past 240 hour(s))  C difficile quick scan w PCR reflex     Status: None   Collection Time: 05/18/15  1:55 AM  Result Value Ref Range Status   C Diff antigen NEGATIVE NEGATIVE Final   C Diff toxin NEGATIVE NEGATIVE Final   C Diff interpretation Negative for toxigenic C. difficile  Final     Studies:  All Imaging reviewed and is as per above notation   Scheduled Meds: . donepezil  5 mg Oral QHS  . finasteride  5 mg Oral Daily  . hydrALAZINE  10 mg Oral 3 times per day  . Mesalamine  800 mg Oral TID  . mirtazapine  7.5 mg Oral QHS  . sodium chloride  3 mL Intravenous Q12H   Continuous Infusions: . sodium chloride    . dextrose 5 % and 0.9% NaCl 50 mL/hr at 05/22/15 2138     Assessment/Plan:  1. LOC episode-vaso vagal like appearance, drop in pulse rate and improved with posture. EEG concerning for toxic met encephalopathy.  CT head neg.  Not allowing orthostatics.   2. R arm pain-xrays are neg.  Get [place] RUE to r/o DVT 3. Diverticulosis causing LGIB-Hb in 12 range-grad dit as per GI.  Started mesalamine 800 tid as per GI and transition to po prednisone 40 daily and OP follow up Billy [name] GI. No indictaion transfusion for now. Tubular adenoma and severe colitis 4. Episodic vasovagal event-  See above 5. Billy [name] to moderate-re-start aricept 73m /remeron 7.5 when able 6. htn-continue Nifedepine 30 daily-no hypotension  Code  Status: full Family Communication: none +.  Updated 05/22/15 Disposition Plan:  Inpatient-possible d/cSNF once work-up done  JVerneita Griffes MD  [facility] Pager [phone]/07/2014, 1:00 PM    LOS: 1 day

## 2015-05-23 NOTE — Clinical Social Work Placement (Signed)
   CLINICAL SOCIAL WORK PLACEMENT  NOTE  Date:  05/23/2015  Patient Details  Name: Billy KiefSylvester [name] MRN: [id number] Date of Birth: 05-25-1932  Clinical Social Work is seeking post-discharge placement for this patient at the Skilled  Nursing Facility level of care (*CSW will initial, date and re-position this form in  chart as items are completed):  Yes   Patient/family provided with [facility] Clinical Social Work Department's list of facilities offering this level of care within the geographic area requested by the patient (or if unable, by the patient's family).  Yes   Patient/family informed of their freedom to choose among providers that offer the needed level of care, that participate in Medicare, Medicaid or managed care program needed by the patient, have an available bed and are willing to accept the patient.  Yes   Patient/family informed of [facility]'s ownership interest in [facility] and [facility], as well as of the fact that they are under no obligation to receive care at these facilities.  PASRR submitted to EDS on 05/23/15     PASRR number received on 05/23/15     Existing PASRR number confirmed on       FL2 transmitted to all facilities in geographic area requested by pt/family on 05/23/15     FL2 transmitted to all facilities within larger geographic area on       Patient informed that his/her managed care company has contracts with or will negotiate with certain facilities, including the following:            Patient/family informed of bed offers received.  Patient chooses bed at       Physician recommends and patient chooses bed at      Patient to be transferred to   on  .  Patient to be transferred to facility by       Patient family notified on   of transfer.  Name of family member notified:        PHYSICIAN Please sign FL2     Additional Comment:    _______________________________________________ Gwynne EdingerBibbs, Altheria Shadoan, LCSW 05/23/2015,  2:54 PM

## 2015-05-23 NOTE — Clinical Social Work Note (Signed)
Clinical Social Work Assessment  Patient Details  Name: Billy [name] MRN: [id number] Date of Birth: 1931/10/13  Date of referral:  05/23/15               Reason for consult:  Facility Placement                Permission sought to share information with:  Family Supports Permission granted to share information::     Name::     Melonie [place]   Relationship::  niece   Contact Information:     Housing/Transportation Living arrangements for the past 2 months:  Single Family Home Source of Information:  Other (Comment Required) (niece) Patient Interpreter Needed:  None Criminal Activity/Legal Involvement Pertinent to Current Situation/Hospitalization:  No - Comment as needed Significant Relationships:  Other Family Members Lives with:  Self Do you feel safe going back to the place where you live?  No Need for family participation in patient care:  Yes (Comment)  Care giving concerns: None    Social Worker assessment / plan:  CSW met with the pt's niece Lyman Bishop at the bedside. CSW introduced self and purpose of the visit. CSW discussed SNF rehab. CSW explained insurance and its relation to SNF placement.  CSW provided the [place] with a SNF list. CSW answered all questions in which the [place] inquired about. CSW will continue to follow this pt and assist with discharge as needed.   Employment status:  Retired Nurse, adult PT Recommendations:  [place] / Referral to community resources:  [place]  Patient/Family's Response to care:  Lyman Bishop shared that the pt has been cared for well.   Patient/Family's Understanding of and Emotional Response to Diagnosis, Current Treatment, and Prognosis: Lyman Bishop reported that she understands the pt's current treatment and diagnosis. Lyman Bishop has agreed to [facility] rehab.    Emotional Assessment Appearance:   (Unable to Assess ) Attitude/Demeanor/Rapport:  Unable to Assess Affect  (typically observed):  Unable to Assess Orientation:   (Disoriented ) Alcohol / Substance use:  Not Applicable Psych involvement (Current and /or in the community):  No (Comment)  Discharge Needs  Concerns to be addressed:  Denies Needs/Concerns at this time Readmission within the last 30 days:  No Current discharge risk:  None Barriers to Discharge:  No Barriers Identified   Jaida Basurto, LCSW 05/23/2015, 2:34 PM

## 2015-05-23 NOTE — Progress Notes (Signed)
Physical Therapy Treatment Patient Details Name: Billy [name] MRN: [id number] DOB: [birth year] Today's Date: 05/23/2015    History of Present Illness Cotey Rakes is a 81 y.o. male with h/o Alheimer's dementia presenting with rectal bleeding for the last 3 weeks worsening in the last 2 days.    PT Comments    Mr. Pankratz was oriented to person only and session limited to pt's apparent Rt UE and LE pain.  Attempted to perform sit>stand; however, pt moaning and resists and pt assisted back to supine in bed.  D/c plan updated to SNF as pt is not appropriate at this time to d/c home and requires skilled assist for all mobility.   Follow Up Recommendations  SNF;Supervision/Assistance - 24 hour     Equipment Recommendations  Other (comment) (TBD)    Recommendations for Other Services       Precautions / Restrictions Precautions Precautions: Fall Precaution Comments: c/o Rt UE and LE pain    Mobility  Bed Mobility Overal bed mobility: Needs Assistance Bed Mobility: Rolling;Sidelying to Sit;Sit to Sidelying Rolling: Mod assist Sidelying to sit: Mod assist     Sit to sidelying: Mod assist General bed mobility comments: Verbal and tactile cues, pt does not initiate movement.  Supporting trunk to assist pt w/ sidelying<>sit.    Transfers                 General transfer comment: Attempted to assist pt w/ initiating sit>stand; however pt resists and is moaning and guarding Rt UE.  Ambulation/Gait                 Stairs            Wheelchair Mobility    Modified Rankin (Stroke Patients Only)       Balance Overall balance assessment: Needs assistance Sitting-balance support: No upper extremity supported;Feet supported Sitting balance-Leahy Scale: Fair Sitting balance - Comments: Close min guard 2/2 pt's cognitive impairments                            Cognition Arousal/Alertness: Awake/alert Behavior During Therapy: WFL  for tasks assessed/performed Overall Cognitive Status: History of cognitive impairments - at baseline Area of Impairment: Orientation;Following commands;Awareness Orientation Level: Disoriented to;Place;Time;Situation     Following Commands: Follows one step commands inconsistently   Awareness: Emergent   General Comments: When asked if pt needs anything he responds, "I need my tractor"    Exercises General Exercises - Lower Extremity Long Arc Quad: PROM;AROM;Left;10 reps;Seated (Pt moans w/ attempted PROM Rt LE)    General Comments        Pertinent Vitals/Pain Pain Assessment: Faces Faces Pain Scale: Hurts whole lot Pain Location: Rt UE and Rt LE Pain Descriptors / Indicators: Grimacing;Guarding;Moaning Pain Intervention(s): Limited activity within patient's tolerance;Monitored during session;Repositioned    Home Living                      Prior Function            PT Goals (current goals can now be found in the care plan section) Acute Rehab PT Goals PT Goal Formulation: Patient unable to participate in goal setting Time For Goal Achievement: 06/02/15 Potential to Achieve Goals: Good Progress towards PT goals: Not progressing toward goals - comment (2/2 apparent Rt UE and LE pain)    Frequency  Min 3X/week    PT Plan Discharge plan needs to be updated  Co-evaluation             End of Session Equipment Utilized During Treatment: Gait belt Activity Tolerance: Patient limited by pain Patient left: in bed;with call bell/phone within reach;with bed alarm set     Time: [id number] PT Time Calculation (min) (ACUTE ONLY): 20 min  Charges:  $Therapeutic Activity: 8-22 mins                    G Codes:      Michail JewelsAshley Parr PT, [place] [phone] Pager: [phone] 05/23/2015, 4:34 PM

## 2015-05-24 ENCOUNTER — Inpatient Hospital Stay (HOSPITAL_COMMUNITY): Payer: Medicare Other

## 2015-05-24 ENCOUNTER — Encounter (HOSPITAL_COMMUNITY): Payer: Medicare Other

## 2015-05-24 DIAGNOSIS — I10 Essential (primary) hypertension: Secondary | ICD-10-CM

## 2015-05-24 DIAGNOSIS — F0390 Unspecified dementia without behavioral disturbance: Secondary | ICD-10-CM

## 2015-05-24 DIAGNOSIS — K529 Noninfective gastroenteritis and colitis, unspecified: Secondary | ICD-10-CM

## 2015-05-24 DIAGNOSIS — M79601 Pain in right arm: Secondary | ICD-10-CM

## 2015-05-24 LAB — BASIC METABOLIC PANEL
ANION GAP: 12 (ref 5–15)
BUN: 12 mg/dL (ref 6–20)
CHLORIDE: 112 mmol/L — AB (ref 101–111)
CO2: 22 mmol/L (ref 22–32)
Calcium: 8.4 mg/dL — ABNORMAL LOW (ref 8.9–10.3)
Creatinine, Ser: 0.89 mg/dL (ref 0.61–1.24)
GFR calc Af Amer: >60 mL/min (ref >60)
GFR calc non Af Amer: >60 mL/min (ref >60)
GLUCOSE: 95 mg/dL (ref 65–99)
POTASSIUM: 3.4 mmol/L — AB (ref 3.5–5.1)
Sodium: 146 mmol/L — ABNORMAL HIGH (ref 135–145)

## 2015-05-24 LAB — CBC WITH DIFFERENTIAL/PLATELET
BASOS ABS: 0 10*3/uL (ref 0.0–0.1)
Basophils Relative: 0 %
Eosinophils Absolute: 0.6 10*3/uL (ref 0.0–0.7)
Eosinophils Relative: 3 %
HEMATOCRIT: 36.2 % — AB (ref 39.0–52.0)
HEMOGLOBIN: 11.9 g/dL — AB (ref 13.0–17.0)
LYMPHS PCT: 8 %
Lymphs Abs: 1.4 10*3/uL (ref 0.7–4.0)
MCH: 28.9 pg (ref 26.0–34.0)
MCHC: 32.9 g/dL (ref 30.0–36.0)
MCV: 87.9 fL (ref 78.0–100.0)
MONO ABS: 2.4 10*3/uL — AB (ref 0.1–1.0)
Monocytes Relative: 14 %
NEUTROS ABS: 12.4 10*3/uL — AB (ref 1.7–7.7)
NEUTROS PCT: 75 %
Platelets: 416 10*3/uL — ABNORMAL HIGH (ref 150–400)
RBC: 4.12 MIL/uL — AB (ref 4.22–5.81)
RDW: 14.8 % (ref 11.5–15.5)
WBC: 16.8 10*3/uL — ABNORMAL HIGH (ref 4.0–10.5)

## 2015-05-24 LAB — URIC ACID: Uric Acid, Serum: 4.3 mg/dL — ABNORMAL LOW (ref 4.4–7.6)

## 2015-05-24 MED ORDER — HALOPERIDOL LACTATE 5 MG/ML IJ SOLN: 2.5000 mg | Freq: Once | INTRAMUSCULAR | Status: DC

## 2015-05-24 MED ORDER — PREDNISONE 20 MG PO TABS: 20.0000 mg | ORAL_TABLET | Freq: Every day | ORAL | Status: DC

## 2015-05-24 MED ORDER — POTASSIUM CHLORIDE CRYS ER 20 MEQ PO TBCR
40.0000 meq | EXTENDED_RELEASE_TABLET | Freq: Once | ORAL | Status: AC
  Administered 2015-05-24: 40 meq via ORAL
  Filled 2015-05-24: qty 2

## 2015-05-24 MED ORDER — PREDNISONE 20 MG PO TABS
20.0000 mg | ORAL_TABLET | Freq: Every day | ORAL | Status: DC
  Administered 2015-05-25 – 2015-05-30 (×5): 20 mg via ORAL
  Filled 2015-05-24 (×5): qty 1

## 2015-05-24 NOTE — Progress Notes (Addendum)
[facility] PROGRESS NOTE  Billy [name] ZOX:[id number] DOB: 10-15-1931 DOA: 05/17/2015  PCP: [name], MD  Brief HPI: 81 year old African-American male with a past medical history of dementia, diverticulosis, hypertension, presented with rectal bleeding.  Past medical history:  Past Medical History  Diagnosis Date  . Hypertension   . Hyperlipidemia   . Alzheimer's disease   . HTN (hypertension) 11/21/2014  . Dementia 11/21/2014  . BPH (benign prostatic hyperplasia) 11/21/2014    Consultants: [facility]  Procedures:  Colonoscopy 10/28 IMPRESSIONS:  1.mild to moderate colitis throughout status post biopsy seemingly sparing the cecum and proximal ascending and most distal rectum  2. primarily sigmoid small diverticuli with few scattered throughout the remainder of the colon  3. transverse and descending small polyp status post cold biopsy  4. normal terminal ileum  5. otherwise within normal limits as above with cecum and parts of ascending transverse and sigmoid not well seen  EEG "Interpretation: This EEG is abnormal with moderately severe generalized continuous nonspecific slowing of cerebral activity consistent with moderately severe encephalopathic state. Significance of triphasic sharp waves, which are most often associated with metabolic encephalopathy, is unclear. No frank seizure discharges were recorded."  Antibiotics: None  Subjective: Patient unable to communicate effectively due to his dementia. Does seem to have some discomfort in the right arm.  Objective: Vital Signs  Filed Vitals:   05/23/15 0521 05/23/15 1525 05/23/15 2325 05/24/15 0646  BP: 132/78 134/88 133/75 130/74  Pulse: 117  106 106  Temp: 98.3 F (36.8 C)  98.2 F (36.8 C) 98.3 F (36.8 C)  TempSrc: Oral  Oral Oral  Resp: Height:      Weight:      SpO2: 99%  98% 98%    Intake/Output Summary (Last 24 hours) at 05/24/15 1417 Last data filed at  05/24/15 0855  Gross per 24 hour  Intake    720 ml  Output    350 ml  Net    370 ml   Filed Weights   05/17/15 2127 05/19/15 1302  Weight: 54.9 kg (121 lb 0.5 oz) 54.885 kg (121 lb)    General appearance: alert, distracted and no distress Resp: clear to auscultation bilaterally Cardio: regular rate and rhythm, S1, S2 normal, no murmur, click, rub or gallop GI: soft, non-tender; bowel sounds normal; no masses,  no organomegaly Extremities: Right upper extremity is contracted. No obvious swelling or deformity noted. Some warmth in the right elbow without any swelling. Neurologic: Advanced dementia. No other focal deficits. Contracted right upper extremity. It is chronic per his neice.  Lab Results:  Basic Metabolic Panel:  Recent Labs Lab 05/17/15 1525 05/20/15 0941 05/24/15 0535  NA 141 139 146*  K 3.7 3.6 3.4*  CL 107 104 112*  CO2 GLUCOSE 102* 79 95  BUN CREATININE 1.09 1.37* 0.89  CALCIUM 9.2 8.5* 8.4*   Liver Function Tests:  Recent Labs Lab 05/17/15 1525 05/20/15 0941  AST 22 26  ALT 14* 17  ALKPHOS 46 45  BILITOT 0.3 0.6  PROT 6.6 6.6  ALBUMIN 3.0* 2.7*   CBC:  Recent Labs Lab 05/17/15 1525  05/18/15 0625 05/18/15 0932 05/20/15 0038 05/20/15 0941 05/24/15 0535  WBC 8.5  --   --   --   --  9.9 16.8*  NEUTROABS 4.6  --   --   --   --  7.7 12.4*  HGB 12.1*  --   --   --  11.9* 12.7* 11.9*  HCT 36.7*  < > 38.2* 39.2 35.7* 37.3* 36.2*  MCV 87.4  --   --   --   --  85.0 87.9  PLT 426*  --   --   --   --  323 416*  < > = values in this interval not displayed. Cardiac Enzymes:  Recent Labs Lab 05/20/15 0941 05/20/15 1338 05/20/15 1937  TROPONINI 0.08* 0.08* 0.09*    CBG:  Recent Labs Lab 05/19/15 2135  GLUCAP 89    Recent Results (from the past 240 hour(s))  C difficile quick scan w PCR reflex     Status: None   Collection Time: 05/18/15  1:55 AM  Result Value Ref Range Status   C Diff antigen NEGATIVE NEGATIVE  Final   C Diff toxin NEGATIVE NEGATIVE Final   C Diff interpretation Negative for toxigenic C. difficile  Final      Studies/Results: Dg Forearm Right  05/22/2015  CLINICAL DATA:  Right arm contraction. EXAM: RIGHT FOREARM - 2 VIEW COMPARISON:  None. FINDINGS: There is no evidence of fracture or other focal bone lesions. No significant arthropathy identified. Soft tissues are unremarkable. IMPRESSION: Normal right forearm. Electronically Signed   By: Irish Lack M.D.   On: 05/22/2015 17:30   Dg Humerus Right  05/22/2015  CLINICAL DATA:  Right arm pain.  No known injury. EXAM: RIGHT HUMERUS - 2+ VIEW COMPARISON:  None. FINDINGS: There is degenerative spur formation at the right elbow of and mild glenohumeral joint degenerative changes. There is also some superior migration of the humeral head relative to the glenoid. No fracture or dislocation. IMPRESSION: Right shoulder and elbow degenerative changes and possible chronic rotator cuff tear. Electronically Signed   By: Beckie Salts M.D.   On: 05/22/2015 17:30    Medications:  Scheduled: . donepezil  5 mg Oral QHS  . finasteride  5 mg Oral Daily  . Mesalamine  800 mg Oral TID  . mirtazapine  7.5 mg Oral QHS  . predniSONE  20 mg Oral Q breakfast  . sodium chloride  3 mL Intravenous Q12H   Continuous: . sodium chloride     WUJ:[id number] **OR** acetaminophen, HYDROcodone-acetaminophen, ondansetron **OR** ondansetron (ZOFRAN) IV  Assessment/Plan:  Active Problems:   Lower GI bleed    Rectal bleeding Patient was seen by gastroenterology. He underwent colonoscopy which shows mild-to-moderate colitis. Etiology shows severely active chronic colitis without any evidence for dysplasia. Inflammatory bowel disease was thought to be likely. Patient was started on mesalamine. Oral prednisone also appears to have been recommended by gastroenterology. No further bleeding. Hemoglobin is stable.  Loss of consciousness This occurred while  he was having a bowel movement. Could have been vasovagal. Has been stable since then. Workup has been negative so far.   Right-sided pain During his loss of consciousness he fell on his right side, which could explain the right-sided pain that he has been experiencing. X-rays of the right upper extremities were unremarkable. Gout is also in the differential. Steroids might help. His niece is also concerned about his right foot. Imaging studies will be ordered. Venous Doppler studies pending.  Diverticulosis seen on colonoscopy Avoid constipation.   History of dementia Mild to moderate per history. Continue home medications.  History of essential hypertension Continue nifedipine  Leukocytosis Sudden increase in WBC noted today. He is afebrile. Repeat tomorrow if he is still here, otherwise as can be repeated at the SNF.  Hypokalemia Repleted.  DVT Prophylaxis: SCDs  Code Status: Full code  Family Communication: Discussed with his niece  Disposition Plan: To SNF when workup is completed.     LOS: 2 days   [facility] Healthcare[facility]ISHNAN,Isamu Trammel  [facility] Pager [phone] 05/24/2015, 2:17 PM  If 7PM-7AM, please contact night-coverage at [email or web address], password [email or web address]

## 2015-05-24 NOTE — Clinical Social Work Placement (Signed)
   CLINICAL SOCIAL WORK PLACEMENT  NOTE  Date:  05/24/2015  Patient Details  Name: Billy [name] MRN: [id number] Date of Birth: July 01, 1932  Clinical Social Work is seeking post-discharge placement for this patient at the Skilled  Nursing Facility level of care (*CSW will initial, date and re-position this form in  chart as items are completed):  Yes   Patient/family provided with [facility] Clinical Social Work Department's list of facilities offering this level of care within the geographic area requested by the patient (or if unable, by the patient's family).  Yes   Patient/family informed of their freedom to choose among providers that offer the needed level of care, that participate in Medicare, Medicaid or managed care program needed by the patient, have an available bed and are willing to accept the patient.  Yes   Patient/family informed of [facility]'s ownership interest in [facility] and [facility], as well as of the fact that they are under no obligation to receive care at these facilities.  PASRR submitted to EDS on 05/23/15     PASRR number received on 05/23/15     Existing PASRR number confirmed on       FL2 transmitted to all facilities in geographic area requested by pt/family on 05/23/15     FL2 transmitted to all facilities within larger geographic area on       Patient informed that his/her managed care company has contracts with or will negotiate with certain facilities, including the following:        Yes   Patient/family informed of bed offers received.  Patient [facility]     Physician recommends and patient chooses bed at      Patient to be transferred to [facility] on 05/24/15.  Patient to be transferred to facility by [facility]      Patient family notified on 05/24/15 of transfer.  Name of family member notified:  Geanie LoganWilla Jones, niece      PHYSICIAN Please sign FL2     Additional Comment:     _______________________________________________ Gwynne EdingerBibbs, Drishti Pepperman, LCSW 05/24/2015, 12:20 PM

## 2015-05-25 ENCOUNTER — Inpatient Hospital Stay (HOSPITAL_COMMUNITY): Payer: Medicare Other

## 2015-05-25 DIAGNOSIS — I808 Phlebitis and thrombophlebitis of other sites: Secondary | ICD-10-CM

## 2015-05-25 DIAGNOSIS — I82401 Acute embolism and thrombosis of unspecified deep veins of right lower extremity: Secondary | ICD-10-CM

## 2015-05-25 DIAGNOSIS — I998 Other disorder of circulatory system: Secondary | ICD-10-CM

## 2015-05-25 DIAGNOSIS — I82A11 Acute embolism and thrombosis of right axillary vein: Secondary | ICD-10-CM | POA: Diagnosis present

## 2015-05-25 DIAGNOSIS — K922 Gastrointestinal hemorrhage, unspecified: Secondary | ICD-10-CM

## 2015-05-25 LAB — BASIC METABOLIC PANEL
Anion gap: 11 (ref 5–15)
BUN: 13 mg/dL (ref 6–20)
CHLORIDE: 113 mmol/L — AB (ref 101–111)
CO2: 23 mmol/L (ref 22–32)
CREATININE: 0.78 mg/dL (ref 0.61–1.24)
Calcium: 8.4 mg/dL — ABNORMAL LOW (ref 8.9–10.3)
GFR calc Af Amer: >60 mL/min (ref >60)
GFR calc non Af Amer: >60 mL/min (ref >60)
GLUCOSE: 99 mg/dL (ref 65–99)
POTASSIUM: 3.4 mmol/L — AB (ref 3.5–5.1)
SODIUM: 147 mmol/L — AB (ref 135–145)

## 2015-05-25 LAB — CBC
HCT: 34.6 % — ABNORMAL LOW (ref 39.0–52.0)
HCT: 35.4 % — ABNORMAL LOW (ref 39.0–52.0)
HEMOGLOBIN: 11.2 g/dL — AB (ref 13.0–17.0)
Hemoglobin: 11.5 g/dL — ABNORMAL LOW (ref 13.0–17.0)
MCH: 28.5 pg (ref 26.0–34.0)
MCH: 28.1 pg (ref 26.0–34.0)
MCHC: 32.4 g/dL (ref 30.0–36.0)
MCHC: 32.5 g/dL (ref 30.0–36.0)
MCV: 88 fL (ref 78.0–100.0)
MCV: 86.6 fL (ref 78.0–100.0)
PLATELETS: 467 10*3/uL — AB (ref 150–400)
PLATELETS: 422 10*3/uL — AB (ref 150–400)
RBC: 3.93 MIL/uL — ABNORMAL LOW (ref 4.22–5.81)
RBC: 4.09 MIL/uL — AB (ref 4.22–5.81)
RDW: 14.7 % (ref 11.5–15.5)
RDW: 14.8 % (ref 11.5–15.5)
WBC: 15.9 10*3/uL — ABNORMAL HIGH (ref 4.0–10.5)
WBC: 17.5 10*3/uL — AB (ref 4.0–10.5)

## 2015-05-25 LAB — HEPARIN LEVEL (UNFRACTIONATED): Heparin Unfractionated: <0.1 IU/mL — ABNORMAL LOW (ref 0.30–0.70)

## 2015-05-25 MED ORDER — SODIUM CHLORIDE 0.9 % IV BOLUS (SEPSIS)
500.0000 mL | Freq: Once | INTRAVENOUS | Status: AC
  Administered 2015-05-25: 500 mL via INTRAVENOUS

## 2015-05-25 MED ORDER — HEPARIN BOLUS VIA INFUSION
2000.0000 [IU] | Freq: Once | INTRAVENOUS | Status: DC
  Filled 2015-05-25: qty 2000

## 2015-05-25 MED ORDER — HEPARIN BOLUS VIA INFUSION
2500.0000 [IU] | Freq: Once | INTRAVENOUS | Status: DC
  Filled 2015-05-25: qty 2500

## 2015-05-25 MED ORDER — PIPERACILLIN-TAZOBACTAM 3.375 G IVPB
3.3750 g | Freq: Three times a day (TID) | INTRAVENOUS | Status: DC
  Administered 2015-05-25 – 2015-05-28 (×8): 3.375 g via INTRAVENOUS
  Filled 2015-05-25 (×12): qty 50

## 2015-05-25 MED ORDER — HEPARIN (PORCINE) IN NACL 100-0.45 UNIT/ML-% IJ SOLN
1250.0000 [IU]/h | INTRAMUSCULAR | Status: AC
  Administered 2015-05-25: 800 [IU]/h via INTRAVENOUS
  Filled 2015-05-25 (×2): qty 250

## 2015-05-25 MED ORDER — CEFAZOLIN SODIUM 1-5 GM-% IV SOLN
1.0000 g | INTRAVENOUS | Status: AC
  Administered 2015-05-26: 1 g via INTRAVENOUS
  Filled 2015-05-25 (×2): qty 50

## 2015-05-25 MED ORDER — VANCOMYCIN HCL IN DEXTROSE 1-5 GM/200ML-% IV SOLN
1000.0000 mg | INTRAVENOUS | Status: DC
  Administered 2015-05-25 – 2015-05-26 (×2): 1000 mg via INTRAVENOUS
  Filled 2015-05-25 (×3): qty 200

## 2015-05-25 MED ORDER — HALOPERIDOL LACTATE 5 MG/ML IJ SOLN
2.0000 mg | Freq: Four times a day (QID) | INTRAMUSCULAR | Status: DC | PRN
  Administered 2015-05-25 – 2015-05-29 (×2): 2 mg via INTRAVENOUS
  Filled 2015-05-25 (×2): qty 1

## 2015-05-25 NOTE — Clinical Social Work Note (Signed)
BSW intern has contacted Georgiana ShoreSandra Greene, admission coordinator at [facility] to provide discharge disposition update.  BSW intern remains available if further Social Work needs arises.  Kyla Balzarineatiana Salomon Ganser-Student Intern [phone]

## 2015-05-25 NOTE — Progress Notes (Addendum)
ANTICOAGULATION CONSULT NOTE - Initial Consult  Pharmacy Consult for Heparin Indication: VTE treatment (+RUE DVT)  No Known Allergies  Patient Measurements: Height: 5\' 9"  (175.3 cm) Weight: 121 lb (54.885 kg) IBW/kg (Calculated) : 70.7 Heparin Dosing Weight: 54.8 kg  Vital Signs: Temp: 101.3 F (38.5 C) (11/03 0500) Temp Source: Oral (11/03 0500) BP: 136/95 mmHg (11/03 0500) Pulse Rate: 112 (11/03 0500)  Labs:  Recent Labs  05/24/15 0535 05/25/15 0620  HGB 11.9* 11.2*  HCT 36.2* 34.6*  PLT 416* 467*  CREATININE 0.89 0.78    Estimated Creatinine Clearance: 56.2 mL/min (by C-G formula based on Cr of 0.78).   Medical History: Past Medical History  Diagnosis Date  . Hypertension   . Hyperlipidemia   . Alzheimer's disease   . HTN (hypertension) 11/21/2014  . Dementia 11/21/2014  . BPH (benign prostatic hyperplasia) 11/21/2014    Medications:  Prescriptions prior to admission  Medication Sig Dispense Refill Last Dose  . donepezil (ARICEPT) 5 MG tablet Take 1 tablet by mouth at bedtime.  11 Past Week at Unknown time  . finasteride (PROSCAR) 5 MG tablet Take 1 tablet by mouth daily.  11 Past Week at Unknown time  . mirtazapine (REMERON) 7.5 MG tablet Take 1 tablet by mouth at bedtime.  11 Past Week at Unknown time  . NIFEdipine (PROCARDIA-XL/ADALAT-CC/NIFEDICAL-XL) 30 MG 24 hr tablet Take 30 mg by mouth daily.  11 Past Week at Unknown time  . docusate sodium (COLACE) 100 MG capsule Take 1 capsule (100 mg total) by mouth 2 (two) times daily. (Patient not taking: Reported on 05/17/2015) 10 capsule 0 Not Taking at Unknown time  . hydrocortisone (ANUSOL-HC) 25 MG suppository Place 1 suppository (25 mg total) rectally 2 (two) times daily. (Patient not taking: Reported on 05/17/2015) 30 suppository 0 Not Taking at Unknown time   Scheduled:  . donepezil  5 mg Oral QHS  . finasteride  5 mg Oral Daily  . haloperidol lactate  2.5 mg Intravenous Once  . Mesalamine  800 mg Oral TID   . mirtazapine  7.5 mg Oral QHS  . predniSONE  20 mg Oral Q breakfast  . sodium chloride  3 mL Intravenous Q12H    Assessment: 81 y.o male with +DVT RUE. RUE venous duplex today revealed acute, non occlusive DVT noted in the right axillary vein beginning at the origin in the axilla, extending into the chest. RN noted patient had loosebowel movement with some light dark red blood in the stool x 1 this AM. [facility] with rectal bleeding. She h/o diverticulosis and external hemorrhoids. Colonoscopy on 10/28 shows mild-to-moderate colitis. Patient was started on mesalamine. Hgb stable at 11.2, plt stable at 467. The patient was not on anticoagulation PTA.  Pharmacy consulted to start IV heparin infusion for VTE treatment of +RUE DVT.   Goal of Therapy:  Heparin level 0.3-0.7 units/ml Monitor platelets by anticoagulation protocol: Yes   Plan:  Heparin 2000 unit IV bolus x1 Heparin infusion rate to start at 800 units/hr Check heparin level in 8 hours Daily heparin level and CBC.     Thank you for allowing pharmacy to be part of this patients care team. Noah Delaineuth Woodroe Vogan, RPh Clinical Pharmacist Pager: [phone] 05/25/2015,9:58 AM  ADDENDUM:  I spoke with Dr. Rito EhrlichKrishnan. He prefers to not give a bolus of heparin for this patient due to his recent rectal bleeding.   Plan: Cancelled the heparin bolus ordered Proceed with heparin drip rate at 800 units/hr.  Change heparin level check to 6 hrs post start of heparin drip.  Noah Delaine, RPh Clinical Pharmacist Pager: [phone] 05/25/2015 11:05 AM

## 2015-05-25 NOTE — Care Management Important Message (Signed)
Important Message  Patient Details  Name: Jerald KiefSylvester Mazzoni MRN: [id number] Date of Birth: May 25, 1932   Medicare Important Message Given:  Yes-second notification given    [name], Collin Rengel Abena 05/25/2015, 3:47 PM

## 2015-05-25 NOTE — Progress Notes (Addendum)
[facility] PROGRESS NOTE  Billy [name] ZOX:[id number] DOB: May 29, 1932 DOA: 05/17/2015  PCP: [name], MD  Brief HPI: 81 year old African-American male with a past medical history of dementia, diverticulosis, hypertension, presented with rectal bleeding.  Past medical history:  Past Medical History  Diagnosis Date  . Hypertension   . Hyperlipidemia   . Alzheimer's disease   . HTN (hypertension) 11/21/2014  . Dementia 11/21/2014  . BPH (benign prostatic hyperplasia) 11/21/2014    Consultants: [facility] gastroenterology  Procedures:  Colonoscopy 10/28 IMPRESSIONS:  1.mild to moderate colitis throughout status post biopsy seemingly sparing the cecum and proximal ascending and most distal rectum  2. primarily sigmoid small diverticuli with few scattered throughout the remainder of the colon  3. transverse and descending small polyp status post cold biopsy  4. normal terminal ileum  5. otherwise within normal limits as above with cecum and parts of ascending transverse and sigmoid not well seen  EEG "Interpretation: This EEG is abnormal with moderately severe generalized continuous nonspecific slowing of cerebral activity consistent with moderately severe encephalopathic state. Significance of triphasic sharp waves, which are most often associated with metabolic encephalopathy, is unclear. No frank seizure discharges were recorded."  Antibiotics: None  Subjective: Patient unable to communicate effectively due to his dementia. Discussed with his niece who raised concerns about pain in his right foot, which she attributed to a fall.   Objective: Vital Signs  Filed Vitals:   05/24/15 0646 05/24/15 2124 05/25/15 0500 05/25/15 1327  BP: 130/74 124/76 136/95 116/87  Pulse: 106 116 112 109  Temp: 98.3 F (36.8 C) 99.8 F (37.7 C) 101.3 F (38.5 C) 99.3 F (37.4 C)  TempSrc: Oral Oral Oral Oral  Resp: Height:      Weight:      SpO2: 98% 98% 100%  100%    Intake/Output Summary (Last 24 hours) at 05/25/15 1344 Last data filed at 05/25/15 0900  Gross per 24 hour  Intake    380 ml  Output    300 ml  Net     80 ml   Filed Weights   05/17/15 2127 05/19/15 1302  Weight: 54.9 kg (121 lb 0.5 oz) 54.885 kg (121 lb)    General appearance: alert, distracted and no distress Resp: clear to auscultation bilaterally Cardio: regular rate and rhythm, S1, S2 normal, no murmur, click, rub or gallop GI: soft, non-tender; bowel sounds normal; no masses,  no organomegaly Extremities: Right upper extremity is contracted. No obvious swelling or deformity noted. Some warmth in the right elbow without any swelling. Examination of the right foot reveals that it's cold to touch and dusky in appearance. Pulses are nonpalpable in the dorsalis pedis or posterior tibial area. Neurologic: Advanced dementia. No other focal deficits. Contracted right upper extremity. It is chronic per his neice.  Lab Results:  Basic Metabolic Panel:  Recent Labs Lab 05/20/15 0941 05/24/15 0535 05/25/15 0620  NA 139 146* 147*  K 3.6 3.4* 3.4*  CL 104 112* 113*  CO2 GLUCOSE 79 95 99  BUN CREATININE 1.37* 0.89 0.78  CALCIUM 8.5* 8.4* 8.4*   Liver Function Tests:  Recent Labs Lab 05/20/15 0941  AST 26  ALT 17  ALKPHOS 45  BILITOT 0.6  PROT 6.6  ALBUMIN 2.7*   CBC:  Recent Labs Lab 05/20/15 0038 05/20/15 0941 05/24/15 0535 05/25/15 0620  WBC  --  9.9 16.8* 15.9*  NEUTROABS  --  7.7  12.4*  --   HGB 11.9* 12.7* 11.9* 11.2*  HCT 35.7* 37.3* 36.2* 34.6*  MCV  --  85.0 87.9 88.0  PLT  --  323 416* 467*   Cardiac Enzymes:  Recent Labs Lab 05/20/15 0941 05/20/15 1338 05/20/15 1937  TROPONINI 0.08* 0.08* 0.09*    CBG:  Recent Labs Lab 05/19/15 2135  GLUCAP 89    Recent Results (from the past 240 hour(s))  C difficile quick scan w PCR reflex     Status: None   Collection Time: 05/18/15  1:55 AM  Result Value Ref Range  Status   C Diff antigen NEGATIVE NEGATIVE Final   C Diff toxin NEGATIVE NEGATIVE Final   C Diff interpretation Negative for toxigenic C. difficile  Final      Studies/Results: Dg Foot Complete Right  05/24/2015  CLINICAL DATA:  Right foot tenderness.  Altered mental status. EXAM: RIGHT FOOT COMPLETE - 3+ VIEW COMPARISON:  None. FINDINGS: Slightly motion degraded AP image. No fracture or dislocation. Lisfranc joint appears intact. No aggressive-appearing focal osseous lesions. No appreciable degenerative or erosive arthropathy. Small enthesophyte at the lateral base of right fifth metatarsal. Tiny plantar and Achilles right calcaneal spurs. IMPRESSION: No fracture or dislocation.  Enthesophytes as described. Electronically Signed   By: Delbert PhenixJason A Poff M.D.   On: 05/24/2015 19:06    Medications:  Scheduled: . [START ON 05/26/2015]  ceFAZolin (ANCEF) IV  1 g Intravenous To OR  . donepezil  5 mg Oral QHS  . finasteride  5 mg Oral Daily  . haloperidol lactate  2.5 mg Intravenous Once  . Mesalamine  800 mg Oral TID  . mirtazapine  7.5 mg Oral QHS  . piperacillin-tazobactam (ZOSYN)  IV  3.375 g Intravenous 3 times per day  . predniSONE  20 mg Oral Q breakfast  . sodium chloride  3 mL Intravenous Q12H  . vancomycin  1,000 mg Intravenous Q24H   Continuous: . sodium chloride    . heparin 800 Units/hr (05/25/15 1139)   XLK:GMWNUUVOZDGUYPRN:acetaminophen **OR** acetaminophen, HYDROcodone-acetaminophen, ondansetron **OR** ondansetron (ZOFRAN) IV  Assessment/Plan:  Principal Problem:   Ischemic foot Active Problems:   HTN (hypertension)   Dementia   Protein-calorie malnutrition, severe (HCC)   Alzheimer's disease   Lower GI bleed   DVT of right axillary vein, acute    Right lower extremity ischemia According to his niece, his foot has been cold to touch at least since Monday. Due to patient's dementia history has been difficult to obtain. His right foot does appear to be ischemic. Vascular surgery has  been consulted. Long discussion with patient's power of attorney, Ms. Guinevere FerrariJean Fuller at [phone] who is patient's POA. She has multiple concerns about patient's care. It doesn't appear that medical staff made aware of any problem with right foot before yesterday. Apparently the right foot has been cold for past 5 days per family. Understand family's concern. Will pass this on to administration.  DVT in the right axillary vein This was detected on venous Doppler study. Intravenous heparin has been initiated. Due to recent GI bleed, we need to be cautious with this approach.  Fever with leukocytosis Patient febrile this morning. WBC remains elevated. Could be related to ischemic right foot. Initiate broad-spectrum antibiotics for now. Blood cultures have been obtained and are pending.  Rectal bleeding Patient was seen by gastroenterology. He underwent colonoscopy which shows mild-to-moderate colitis. Etiology shows severely active chronic colitis without any evidence for dysplasia. Inflammatory bowel disease was thought to be  likely. Patient was started on mesalamine. Oral prednisone also appears to have been recommended by gastroenterology. According to nursing reports minimal amount of bleeding was noted with bowel movement. Could be old blood. Hemoglobin has been stable.   Loss of consciousness This occurred a few days ago in the hospital while he was having a bowel movement. Could have been vasovagal. Has been stable since then. Workup has been negative so far.   Right-sided pain During his loss of consciousness he fell on his right side, which could explain the right-sided pain that he has been experiencing. X-rays of the right upper extremities were unremarkable. Uric acid is normal.   Diverticulosis seen on colonoscopy Avoid constipation.   History of dementia Continue home medications. Appears to be moderate to advanced based on examination.  History of essential hypertension Continue  nifedipine  Hypokalemia Repleted.  DVT Prophylaxis: SCDs    Code Status: Full code  Family Communication: Discussed with his niece and POA Guinevere Ferrari at [phone] (H). Cell [phone]. Disposition Plan: Continue management as above     LOS: 3 days   [facility]  [facility] Pager [phone] 05/25/2015, 1:44 PM  If 7PM-7AM, please contact night-coverage at [email or web address], password [facility]

## 2015-05-25 NOTE — Progress Notes (Signed)
RN noted patientt had loose  bowel movement with  some light -dark red blood in the stool x 1 this am and  just now during am blood work  Charity fundraiserN again noted blood with small dark clot on pt's blanket  and a fever of 101.4. On call medical personnel notified and new orders started .

## 2015-05-25 NOTE — Consult Note (Signed)
[facility] Consult VASCULAR SURGERY :  Agree with note below. I do not think that the right foot is salvageable. It has reportedly been hurting for 5 days. He has markedly decreased motor and sensory function. He has a non-retrievable capillary bed. In addition he is markedly debilitated, has dementia, severe protein malnutrition, and recent GI bleed. Given his debilitated state and contracture of the right knee, I would recommend right AKA. He cannot give his own consent. I tried to call the one number that I have for family, but there is no answer and no voicemail box set up. If consent can be obtained, I can proceed with right AKA tomorrow.  Jahmad Petrich, MD, FACS Beeper [phone] Office: [phone]   Reason for ConsulWaverly Ferrarit:  Cold right foot Referring Physician:  Osvaldo ShipperGokul Krishnan, MD MRN #:  [id number]  History of Present Illness: This is a [age] y.o. male who we've been consulted on regarding a painful, cold right foot. The patient has a history of dementia and is a poor historian. There is no family at the bedside. I did speak his niece Geanie LoganWilla Jones, who reports that the patient started complaining of right foot pain and right arm pain since 5 days ago. The niece reports that the patient was unwilling to put weight on that foot secondary to pain. She says that he is usually ambulatory with a cane.  He denies any prior history of pain in his legs or feet. He lives at home with caretakers. He reports falling recently, but unable to report exactly when. He is unable to say why he was admitted to the hospital.  He was admitted to the hospital on 05/17/15 for dehydration and worsening rectal bleeding that has been present for the past 3 weeks. He has a history of diverticulosis and external hemorrhoids. He underwent colonoscopy on this admission which showed mild to moderate colitis. He was started on mesalamine and oral prednisone.   Also during this admission, he had  an episode of LOC while having a bowel movement. He fell on his right-side and has been complaining of right sided pain. X-rays have been unremarkable. A venous duplex today revealed acute DVT in the right axillary vein.   He has a past medical history of hypertension, hyperlipidemia, Alzheimer's and BPH. He has no history of cardiac arrhythmias or cardiac disease. He has no history of CVA or diabetes. He is a former smoker.   Past Medical History  Diagnosis Date  . Hypertension   . Hyperlipidemia   . Alzheimer's disease   . HTN (hypertension) 11/21/2014  . Dementia 11/21/2014  . BPH (benign prostatic hyperplasia) 11/21/2014   Past Surgical History  Procedure Laterality Date  . Colonoscopy N/A 05/19/2015    Procedure: COLONOSCOPY;  Surgeon: Vida RiggerMarc Magod, MD;  [facility];  Service: Endoscopy;  Laterality: N/A;    No Known Allergies  Prior to Admission medications   Medication Sig Start Date End Date Taking? Authorizing Provider  donepezil (ARICEPT) 5 MG tablet Take 1 tablet by mouth at bedtime. 10/25/14  Yes Historical Provider, MD  finasteride (PROSCAR) 5 MG tablet Take 1 tablet by mouth daily. 10/25/14  Yes Historical Provider, MD  mirtazapine (REMERON) 7.5 MG tablet Take 1 tablet by mouth at bedtime. 11/02/14  Yes Historical Provider, MD  NIFEdipine (PROCARDIA-XL/ADALAT-CC/NIFEDICAL-XL) 30 MG 24 hr tablet Take 30 mg by mouth daily. 10/25/14  Yes Historical Provider, MD  docusate sodium (COLACE) 100 MG capsule Take 1 capsule (100 mg total) by  mouth 2 (two) times daily. Patient not taking: Reported on 05/17/2015 12/02/14   Rhetta Mura, MD  hydrocortisone (ANUSOL-HC) 25 MG suppository Place 1 suppository (25 mg total) rectally 2 (two) times daily. Patient not taking: Reported on 05/17/2015 12/02/14   Rhetta Mura, MD  Mesalamine (ASACOL) 400 MG CPDR DR capsule Take 2 capsules (800 mg total) by mouth 3 (three) times daily. 05/21/15   Rhetta Mura, MD  predniSONE  (DELTASONE) 20 MG tablet Take 1 tablet (20 mg total) by mouth daily with breakfast. 05/24/15   Osvaldo Shipper, MD    Social History   Social History  . Marital Status: Married    Spouse Name: N/A  . Number of Children: N/A  . Years of Education: N/A   Occupational History  . Not on file.   Social History Main Topics  . Smoking status: Former Games developer  . Smokeless tobacco: Not on file  . Alcohol Use: No  . Drug Use: No  . Sexual Activity: Not on file   Other Topics Concern  . Not on file   Social History Narrative   History reviewed. No pertinent family history.  ROS:  Positive    Negative    All sytems reviewed and are negative ROS limited due to patient's dementia.   Cardiovascular:  chest pain/pressure  palpitations  SOB lying flat  DOE  pain in legs while walking  pain in legs at rest  pain in legs at night  non-healing ulcers  hx of DVT  swelling in legs  Pulmonary:  productive cough  asthma/wheezing  home O2  Neurologic:  weakness in  arms  legs  numbness in  arms  legs  hx of CVA  mini stroke difficulty speaking or slurred speech  temporary loss of vision in one eye  dizziness  Hematologic:  hx of cancer  bleeding problems  problems with blood clotting easily  Endocrine:    diabetes  thyroid disease  GI  vomiting blood  blood in stool  GU:  CKD/renal failure  HD--[]  M/W/F or  T/T/S  burning with urination  blood in urine  Psychiatric:  anxiety  depression  Musculoskeletal:  arthritis  joint pain  Integumentary:  rashes  ulcers  Constitutional:  fever  chills   Physical Examination  Filed Vitals:   05/25/15 0500  BP: 136/95  Pulse: 112  Temp: 101.3 F (38.5 C)  Resp: 18   Body mass index is 17.86 kg/(m^2).  General:  Thin male in NAD Gait: Not observed HENT: WNL, normocephalic Pulmonary: normal non-labored breathing,  without Rales, rhonchi,  wheezing Cardiac: sinus tachy, no carotid bruits Abdomen: soft, NT/ND, no masses Vascular Exam/Pulses:  Right Left  Radial 2+ (normal) 2+ (normal)  Femoral 2+ (normal) 2+ (normal)  Popliteal absent 2+ (normal)  DP Absent doppler flow 2+ (normal)  PT Absent doppler flow Non palpable   Extremities: right foot is cool is cyanotic changes to toes, no wounds seen. Severe pain to palpation right foot.  Left foot pink and warm. Right arm contracted.  Musculoskeletal: muscle wasting and atrophy noted  Neurologic: markedly diminished motor and sensory function to right foot. Unable to move toes. Motor and sensory intact left foot.   CBC    Component Value Date/Time   WBC 15.9* 05/25/2015 0620   RBC 3.93* 05/25/2015 0620   HGB 11.2* 05/25/2015 0620   HCT 34.6* 05/25/2015 0620   PLT 467* 05/25/2015 0620   MCV 88.0 05/25/2015 0620   MCH 28.5  05/25/2015 0620   MCHC 32.4 05/25/2015 0620   RDW 14.7 05/25/2015 0620   LYMPHSABS 1.4 05/24/2015 0535   MONOABS 2.4* 05/24/2015 0535   EOSABS 0.6 05/24/2015 0535   BASOSABS 0.0 05/24/2015 0535    BMET    Component Value Date/Time   NA 147* 05/25/2015 0620   K 3.4* 05/25/2015 0620   CL 113* 05/25/2015 0620   CO2 23 05/25/2015 0620   GLUCOSE 99 05/25/2015 0620   BUN 13 05/25/2015 0620   CREATININE 0.78 05/25/2015 0620   CALCIUM 8.4* 05/25/2015 0620   GFRNONAA >60 05/25/2015 0620   GFRAA >60 05/25/2015 0620    COAGS: Lab Results  Component Value Date   INR 1.5 02/26/2008   INR 1.6* 02/25/2008   INR 1.6* 02/24/2008    Statin:  No. Beta Blocker:  No. Aspirin:  No. ACEI:  No. ARB:  No. Other antiplatelets/anticoagulants:  No.   ASSESSMENT: This is a 81 y.o. male with ischemia of the right lower extremity, acute right axillary DVT, rectal bleeding, diverticulosis and dementia.   PLAN: The patient has no doppler flow detected in the right foot. It is cool with cyanotic changes.  He has complained of right  foot pain for the past 5 days. He has severely diminished motor and sensory function of his right foot. There is a right femoral pulse. He has normal pulses on the left. The patient has no history of cardiac arrhythmias suggesting acute embolic event. He is normally ambulatory with a cane. He does not describe a history consistent with chronic peripheral vascular disease.   The patient did eat breakfast this morning. He will need an arteriogram versus urgent surgical exploration. Dr. Roland Lipke to evaluate patient and make recommendations.    Kimberly Trinh, PA-C [facility] Office: [phone] Pager: [phone]   

## 2015-05-25 NOTE — Progress Notes (Signed)
OT Cancellation Note  Patient Details Name: Billy KiefSylvester [name] MRN: [id number] DOB: [birth year]   Cancelled Treatment:    Reason Eval/Treat Not Completed: Medical issues which prohibited therapy. Pt with newly diagnosed RUE DVT, doppler ordered will follow when pt is medical stable.  Smiley HousemanJames Landon Brandan Glauber 05/25/2015, 11:08 AM

## 2015-05-25 NOTE — Progress Notes (Signed)
ANTICOAGULATION CONSULT NOTE - Follow Up Consult  Pharmacy Consult for Heparin Indication: DVT  No Known Allergies  Patient Measurements: Height: 5\' 9"  (175.3 cm) Weight: 121 lb (54.885 kg) IBW/kg (Calculated) : 70.7 Heparin Dosing Weight: 54.8 kg  Vital Signs: Temp: 99.3 F (37.4 C) (11/03 1327) Temp Source: Oral (11/03 1327) BP: 116/87 mmHg (11/03 1327) Pulse Rate: 109 (11/03 1327)  Labs:  Recent Labs  05/24/15 0535 05/25/15 0620 05/25/15 1839 05/25/15 1940  HGB 11.9* 11.2* 11.5*  --   HCT 36.2* 34.6* 35.4*  --   PLT 416* 467* 422*  --   HEPARINUNFRC  --   --   --  <0.10*  CREATININE 0.89 0.78  --   --     Estimated Creatinine Clearance: 56.2 mL/min (by C-G formula based on Cr of 0.78).   Medications:  Infusions:  . sodium chloride    . heparin 800 Units/hr (05/25/15 1139)    Assessment: 81 year old male on IV heparin for non-occlusive RUE DVT admitted with RLE ischemia and plans for R-AKA on Friday 11/4. Note patient history of rectal bleeding. First heparin level is low at <0.10 on 800 units/hr and CBC remained stable. Per lab - patient was a difficult stick.   RN states that there is a concern for very tiny amount of blood in stool but amount was insignificant at this time. Instructed RN to keep a close eye on this and alert pharmacy and MD of any changes.   Goal of Therapy:  Heparin level 0.3-0.7 units/ml Monitor platelets by anticoagulation protocol: Yes   Plan:  Increase heparin to 1000 units/hr - no bolus Recheck heparin level in 6 hours - ok with AM labs.  Daily heparin level and CBC  Billy [name], PharmD, BCPS Clinical Pharmacist [phone] 05/25/2015,8:20 PM

## 2015-05-25 NOTE — Progress Notes (Signed)
Physical Therapy Treatment Patient Details Name: Billy [name] MRN: [id number] DOB: 1932-03-20 Today's Date: 05/25/2015    History of Present Illness Billy [name] is a 81 y.o. male with h/o Alheimer's dementia presenting with rectal bleeding for the last 3 weeks worsening in the last 2 days. Pt with newly diagnosed RUE DVT on 05/25/15.    PT Comments    Pt limited by RUE pain (new DVT) and RLE pain (doppler ordered).   Follow Up Recommendations  SNF;Supervision/Assistance - 24 hour     Equipment Recommendations  Other (comment) (TBD)    Recommendations for Other Services       Precautions / Restrictions Precautions Precautions: Fall Precaution Comments: c/o Rt UE and LE pain Restrictions Weight Bearing Restrictions: No    Mobility  Bed Mobility Overal bed mobility: Needs Assistance Bed Mobility: Supine to Sit;Sit to Supine     Supine to sit: Mod assist Sit to supine: Mod assist   General bed mobility comments: Assist to move rt leg off bed and to elevate trunk into sitting. Assist to bring legs back up into bed to return to supine.  Transfers Overall transfer level: Needs assistance Equipment used: Rolling walker (2 wheeled) Transfers: Sit to/from Stand Sit to Stand: Mod assist         General transfer comment: Assist to bring hips up. Pt not bearing weight on RLE due to pain. Stood with hips flexed. Only stood x 5 sec  Ambulation/Gait                 Stairs            Wheelchair Mobility    Modified Rankin (Stroke Patients Only)       Balance   Sitting-balance support: No upper extremity supported;Feet supported Sitting balance-Leahy Scale: Fair Sitting balance - Comments: Close min guard 2/2 pt's cognitive impairments   Standing balance support: Bilateral upper extremity supported Standing balance-Leahy Scale: Poor Standing balance comment: walker and mod A for brief static standing.                     Cognition Arousal/Alertness: Awake/alert Behavior During Therapy: WFL for tasks assessed/performed Overall Cognitive Status: History of cognitive impairments - at baseline Area of Impairment: Orientation;Following commands;Awareness Orientation Level: Disoriented to;Place;Time;Situation   Memory: Decreased recall of precautions;Decreased short-term memory Following Commands: Follows one step commands inconsistently   Awareness: Emergent        Exercises General Exercises - Lower Extremity Long Arc Quad: PROM;AROM;Left;10 reps;Seated (Pt moans w/ attempted PROM Rt LE)    General Comments        Pertinent Vitals/Pain Pain Assessment: Faces Faces Pain Scale: Hurts even more Pain Location: RUE and RLE Pain Descriptors / Indicators: Grimacing;Guarding Pain Intervention(s): Limited activity within patient's tolerance;Monitored during session;Repositioned    Home Living Family/patient expects to be discharged to:: Private residence                    Prior Function            PT Goals (current goals can now be found in the care plan section) Acute Rehab PT Goals PT Goal Formulation: Patient unable to participate in goal setting Time For Goal Achievement: 06/02/15 Potential to Achieve Goals: Good    Frequency  Min 3X/week    PT Plan Discharge plan needs to be updated    Co-evaluation             End of Session Equipment Utilized  During Treatment: Gait belt Activity Tolerance: Patient limited by pain Patient left: in bed;with call bell/phone within reach;with bed alarm set     Time: [id number] PT Time Calculation (min) (ACUTE ONLY): 17 min  Charges:  $Therapeutic Activity: 8-22 mins                    G Codes:      Darshan Solanki 05/25/2015, 10:29 AM Skip Mayerary [place] PT [phone]

## 2015-05-25 NOTE — Progress Notes (Signed)
VASCULAR LAB PRELIMINARY  PRELIMINARY  PRELIMINARY  PRELIMINARY  Right upper extremity venous duplex completed.    Preliminary report:  There is acute, non occlusive DVT noted in the right axillary vein beginning at the origin in the axilla, extending into the chest.   Chatham Howington, RVT 05/25/2015, 9:05 AM

## 2015-05-25 NOTE — Progress Notes (Addendum)
ANTIBIOTIC CONSULT NOTE - INITIAL  Pharmacy Consult for Vancomycin and Zosyn Indication: ischemic right foot with possible infection  No Known Allergies  Patient Measurements: Height: 5\' 9"  (175.3 cm) Weight: 121 lb (54.885 kg) IBW/kg (Calculated) : 70.7   Vital Signs: Temp: 101.3 F (38.5 C) (11/03 0500) Temp Source: Oral (11/03 0500) BP: 136/95 mmHg (11/03 0500) Pulse Rate: 112 (11/03 0500) Intake/Output from previous day: 11/02 0701 - 11/03 0700 In: 260 [P.O.:260] Out: 300 [Urine:300] Intake/Output from this shift: Total I/O In: 120 [P.O.:120] Out: -   Labs:  Recent Labs  05/24/15 0535 05/25/15 0620  WBC 16.8* 15.9*  HGB 11.9* 11.2*  PLT 416* 467*  CREATININE 0.89 0.78   Estimated Creatinine Clearance: 56.2 mL/min (by C-G formula based on Cr of 0.78). No results for input(s): VANCOTROUGH, VANCOPEAK, VANCORANDOM, GENTTROUGH, GENTPEAK, GENTRANDOM, TOBRATROUGH, TOBRAPEAK, TOBRARND, AMIKACINPEAK, AMIKACINTROU, AMIKACIN in the last 72 hours.   Microbiology: Recent Results (from the past 720 hour(s))  C difficile quick scan w PCR reflex     Status: None   Collection Time: 05/18/15  1:55 AM  Result Value Ref Range Status   C Diff antigen NEGATIVE NEGATIVE Final   C Diff toxin NEGATIVE NEGATIVE Final   C Diff interpretation Negative for toxigenic C. difficile  Final    Medical History: Past Medical History  Diagnosis Date  . Hypertension   . Hyperlipidemia   . Alzheimer's disease   . HTN (hypertension) 11/21/2014  . Dementia 11/21/2014  . BPH (benign prostatic hyperplasia) 11/21/2014    Medications:  Scheduled:  . [START ON 05/26/2015]  ceFAZolin (ANCEF) IV  1 g Intravenous To OR  . donepezil  5 mg Oral QHS  . finasteride  5 mg Oral Daily  . haloperidol lactate  2.5 mg Intravenous Once  . Mesalamine  800 mg Oral TID  . mirtazapine  7.5 mg Oral QHS  . predniSONE  20 mg Oral Q breakfast  . sodium chloride  3 mL Intravenous Q12H   Assessment: 81 y.o male  with ischemia of the right lower extremity, acute right axillary DVT, rectal bleeding, diverticulosis and dementia. [facility] with dehydration and worsening rectal bleeding that has been present for the past 3 weeks. He has a history of diverticulosis and external hemorrhoids. He was started on mesalamine and oral prednisone. . Colonoscopy on 10/28 shows mild-to-moderate colitis. Bleeding had resolved on 11/2 but today RN noted some light dark red blood in the stool x 1 this AM. He is markedly debilitated, has dementia and severe protein malnutrition. RUE duplex revealed acute DVT RUE right axillary vein.  Heparin IV infusion started this morning. Now pharmacy consulted to dose Vancomycin and Zosyn for  ischemic right foot with possible infection.  WBC 16.9K, Tc 99.3, Tmax 101.3, SCr 0.78, CrCl ~45 ml/min (using SCr =1.0).    Goal of Therapy:  Vancomycin trough level 10-15 mcg/ml  Plan:  Zosyn 3.375 gm IV q8h (extended 4 hour infusion) Vancomycin 1000 mg IV q24h Monitor daily- clinical status renal function, culture results. Vancomycin trough at steady state.   Noah Delaineuth Laraine Samet, RPh Clinical Pharmacist Pager: [phone] 05/25/2015,1:14 PM

## 2015-05-26 ENCOUNTER — Inpatient Hospital Stay (HOSPITAL_COMMUNITY): Payer: Medicare Other | Admitting: Anesthesiology

## 2015-05-26 ENCOUNTER — Encounter (HOSPITAL_COMMUNITY): Payer: Self-pay | Admitting: Anesthesiology

## 2015-05-26 ENCOUNTER — Encounter (HOSPITAL_COMMUNITY)
Admission: EM | Disposition: A | Discharge status: Skilled Nursing Facility | Payer: Self-pay | Source: Home / Self Care | Attending: Internal Medicine

## 2015-05-26 DIAGNOSIS — F0280 Dementia in other diseases classified elsewhere without behavioral disturbance: Secondary | ICD-10-CM

## 2015-05-26 DIAGNOSIS — G309 Alzheimer's disease, unspecified: Secondary | ICD-10-CM

## 2015-05-26 HISTORY — PX: AMPUTATION: SHX166

## 2015-05-26 LAB — BASIC METABOLIC PANEL
Anion gap: 17 — ABNORMAL HIGH (ref 5–15)
BUN: 17 mg/dL (ref 6–20)
CALCIUM: 8.7 mg/dL — AB (ref 8.9–10.3)
CO2: 21 mmol/L — ABNORMAL LOW (ref 22–32)
CREATININE: 1.11 mg/dL (ref 0.61–1.24)
Chloride: 110 mmol/L (ref 101–111)
GFR calc non Af Amer: >60 mL/min (ref >60)
Glucose, Bld: 95 mg/dL (ref 65–99)
Potassium: 3 mmol/L — ABNORMAL LOW (ref 3.5–5.1)
SODIUM: 148 mmol/L — AB (ref 135–145)

## 2015-05-26 LAB — SURGICAL PCR SCREEN
MRSA, PCR: NEGATIVE
STAPHYLOCOCCUS AUREUS: NEGATIVE

## 2015-05-26 LAB — CBC
HCT: 33.7 % — ABNORMAL LOW (ref 39.0–52.0)
Hemoglobin: 11 g/dL — ABNORMAL LOW (ref 13.0–17.0)
MCH: 27.7 pg (ref 26.0–34.0)
MCHC: 32.6 g/dL (ref 30.0–36.0)
MCV: 84.9 fL (ref 78.0–100.0)
PLATELETS: 169 10*3/uL (ref 150–400)
RBC: 3.97 MIL/uL — AB (ref 4.22–5.81)
RDW: 14.6 % (ref 11.5–15.5)
WBC: 11.1 10*3/uL — AB (ref 4.0–10.5)

## 2015-05-26 LAB — HEPARIN LEVEL (UNFRACTIONATED)

## 2015-05-26 SURGERY — AMPUTATION, ABOVE KNEE
Anesthesia: General | Site: Leg Upper | Laterality: Right

## 2015-05-26 MED ORDER — DEXTROSE 5 % IV SOLN: 1.5000 g | Freq: Two times a day (BID) | INTRAVENOUS | Status: DC

## 2015-05-26 MED ORDER — PROPOFOL 10 MG/ML IV BOLUS
INTRAVENOUS | Status: DC | PRN
  Administered 2015-05-26: 160 mg via INTRAVENOUS

## 2015-05-26 MED ORDER — ALUM & MAG HYDROXIDE-SIMETH 200-200-20 MG/5ML PO SUSP: 15.0000 mL | ORAL | Status: DC | PRN

## 2015-05-26 MED ORDER — ONDANSETRON HCL 4 MG/2ML IJ SOLN
INTRAMUSCULAR | Status: DC | PRN
  Administered 2015-05-26: 4 mg via INTRAVENOUS

## 2015-05-26 MED ORDER — ACETAMINOPHEN 160 MG/5ML PO SOLN: 325.0000 mg | ORAL | Status: DC | PRN

## 2015-05-26 MED ORDER — ONDANSETRON HCL 4 MG/2ML IJ SOLN
INTRAMUSCULAR | Status: AC
  Filled 2015-05-26: qty 2

## 2015-05-26 MED ORDER — BACITRACIN ZINC 500 UNIT/GM EX OINT
TOPICAL_OINTMENT | CUTANEOUS | Status: AC
  Filled 2015-05-26: qty 28.35

## 2015-05-26 MED ORDER — MAGNESIUM SULFATE 2 GM/50ML IV SOLN
2.0000 g | Freq: Every day | INTRAVENOUS | Status: DC | PRN
  Filled 2015-05-26: qty 50

## 2015-05-26 MED ORDER — PHENOL 1.4 % MT LIQD: 1.0000 | OROMUCOSAL | Status: DC | PRN

## 2015-05-26 MED ORDER — HYDROMORPHONE HCL 1 MG/ML IJ SOLN
0.2500 mg | INTRAMUSCULAR | Status: DC | PRN
  Administered 2015-05-27: 0.5 mg via INTRAVENOUS
  Filled 2015-05-26: qty 1

## 2015-05-26 MED ORDER — HYDRALAZINE HCL 20 MG/ML IJ SOLN
INTRAMUSCULAR | Status: AC
Start: 2015-05-26 — End: 2015-05-26
  Administered 2015-05-26: 5 mg via INTRAVENOUS
  Filled 2015-05-26: qty 1

## 2015-05-26 MED ORDER — PANTOPRAZOLE SODIUM 40 MG PO TBEC
40.0000 mg | DELAYED_RELEASE_TABLET | Freq: Every day | ORAL | Status: DC
  Administered 2015-05-26 – 2015-05-30 (×5): 40 mg via ORAL
  Filled 2015-05-26 (×5): qty 1

## 2015-05-26 MED ORDER — GUAIFENESIN-DM 100-10 MG/5ML PO SYRP: 15.0000 mL | ORAL_SOLUTION | ORAL | Status: DC | PRN

## 2015-05-26 MED ORDER — ACETAMINOPHEN 325 MG PO TABS: 325.0000 mg | ORAL_TABLET | ORAL | Status: DC | PRN

## 2015-05-26 MED ORDER — POTASSIUM CHLORIDE CRYS ER 20 MEQ PO TBCR
20.0000 meq | EXTENDED_RELEASE_TABLET | Freq: Every day | ORAL | Status: AC | PRN
  Administered 2015-05-27: 40 meq via ORAL
  Filled 2015-05-26: qty 2

## 2015-05-26 MED ORDER — HYDRALAZINE HCL 20 MG/ML IJ SOLN
5.0000 mg | INTRAMUSCULAR | Status: DC | PRN
  Administered 2015-05-26: 5 mg via INTRAVENOUS
  Filled 2015-05-26: qty 0.25
  Filled 2015-05-26: qty 1

## 2015-05-26 MED ORDER — OXYCODONE HCL 5 MG PO TABS: 5.0000 mg | ORAL_TABLET | Freq: Once | ORAL | Status: AC | PRN

## 2015-05-26 MED ORDER — FENTANYL CITRATE (PF) 100 MCG/2ML IJ SOLN
INTRAMUSCULAR | Status: DC | PRN
  Administered 2015-05-26 (×2): 25 ug via INTRAVENOUS

## 2015-05-26 MED ORDER — OXYCODONE HCL 5 MG/5ML PO SOLN
5.0000 mg | Freq: Once | ORAL | Status: AC | PRN
  Administered 2015-05-28: 5 mg via ORAL
  Filled 2015-05-26: qty 5

## 2015-05-26 MED ORDER — 0.9 % SODIUM CHLORIDE (POUR BTL) OPTIME
TOPICAL | Status: DC | PRN
  Administered 2015-05-26: 1000 mL

## 2015-05-26 MED ORDER — PROPOFOL 10 MG/ML IV BOLUS
INTRAVENOUS | Status: AC
  Filled 2015-05-26: qty 20

## 2015-05-26 MED ORDER — OXYCODONE-ACETAMINOPHEN 5-325 MG PO TABS
1.0000 | ORAL_TABLET | ORAL | Status: DC | PRN
  Administered 2015-05-27 – 2015-05-29 (×2): 2 via ORAL
  Administered 2015-05-29: 1 via ORAL
  Filled 2015-05-26 (×2): qty 2
  Filled 2015-05-26: qty 1

## 2015-05-26 MED ORDER — BACITRACIN ZINC 500 UNIT/GM EX OINT
TOPICAL_OINTMENT | CUTANEOUS | Status: DC | PRN
  Administered 2015-05-26: 1 via TOPICAL

## 2015-05-26 MED ORDER — LACTATED RINGERS IV SOLN
INTRAVENOUS | Status: DC
  Administered 2015-05-26 (×2): via INTRAVENOUS

## 2015-05-26 MED ORDER — DOCUSATE SODIUM 100 MG PO CAPS
100.0000 mg | ORAL_CAPSULE | Freq: Every day | ORAL | Status: DC
  Administered 2015-05-27 – 2015-05-28 (×2): 100 mg via ORAL
  Filled 2015-05-26 (×3): qty 1

## 2015-05-26 MED ORDER — DEXAMETHASONE SODIUM PHOSPHATE 4 MG/ML IJ SOLN
INTRAMUSCULAR | Status: AC
  Filled 2015-05-26: qty 2

## 2015-05-26 MED ORDER — PHENYLEPHRINE HCL 10 MG/ML IJ SOLN
INTRAMUSCULAR | Status: DC | PRN
  Administered 2015-05-26: 80 ug via INTRAVENOUS
  Administered 2015-05-26: 120 ug via INTRAVENOUS
  Administered 2015-05-26 (×2): 80 ug via INTRAVENOUS
  Administered 2015-05-26: 120 ug via INTRAVENOUS
  Administered 2015-05-26: 80 ug via INTRAVENOUS
  Administered 2015-05-26: 40 ug via INTRAVENOUS

## 2015-05-26 MED ORDER — MORPHINE SULFATE (PF) 2 MG/ML IV SOLN
1.0000 mg | INTRAVENOUS | Status: DC | PRN
  Administered 2015-05-26 – 2015-05-28 (×2): 2 mg via INTRAVENOUS
  Filled 2015-05-26 (×4): qty 1

## 2015-05-26 MED ORDER — METOPROLOL TARTRATE 1 MG/ML IV SOLN
2.0000 mg | INTRAVENOUS | Status: DC | PRN
  Filled 2015-05-26: qty 5

## 2015-05-26 MED ORDER — LABETALOL HCL 5 MG/ML IV SOLN
10.0000 mg | INTRAVENOUS | Status: DC | PRN
  Administered 2015-05-27: 10 mg via INTRAVENOUS
  Filled 2015-05-26 (×3): qty 4

## 2015-05-26 MED ORDER — FENTANYL CITRATE (PF) 250 MCG/5ML IJ SOLN
INTRAMUSCULAR | Status: AC
Start: 2015-05-26 — End: 2015-05-26
  Filled 2015-05-26: qty 5

## 2015-05-26 MED ORDER — LIDOCAINE HCL (CARDIAC) 20 MG/ML IV SOLN
INTRAVENOUS | Status: DC | PRN
  Administered 2015-05-26: 60 mg via INTRAVENOUS

## 2015-05-26 MED ORDER — LIDOCAINE HCL (CARDIAC) 20 MG/ML IV SOLN
INTRAVENOUS | Status: AC
  Filled 2015-05-26: qty 5

## 2015-05-26 SURGICAL SUPPLY — 57 items
BANDAGE ELASTIC 4 VELCRO ST LF (GAUZE/BANDAGES/DRESSINGS) ×6 IMPLANT
BANDAGE ELASTIC 6 VELCRO ST LF (GAUZE/BANDAGES/DRESSINGS) IMPLANT
BANDAGE ESMARK 6X9 LF (GAUZE/BANDAGES/DRESSINGS) ×1 IMPLANT
BLADE SAW RECIP 87.9 MT (BLADE) ×3 IMPLANT
BNDG COHESIVE 6X5 TAN STRL LF (GAUZE/BANDAGES/DRESSINGS) ×3 IMPLANT
BNDG ESMARK 6X9 LF (GAUZE/BANDAGES/DRESSINGS) ×3
BNDG GAUZE ELAST 4 BULKY (GAUZE/BANDAGES/DRESSINGS) ×3 IMPLANT
CANISTER SUCTION 2500CC (MISCELLANEOUS) ×3 IMPLANT
CLIP TI MEDIUM 6 (CLIP) IMPLANT
COVER SURGICAL LIGHT HANDLE (MISCELLANEOUS) ×3 IMPLANT
CUFF TOURNIQUET SINGLE 18IN (TOURNIQUET CUFF) IMPLANT
CUFF TOURNIQUET SINGLE 24IN (TOURNIQUET CUFF) ×3 IMPLANT
CUFF TOURNIQUET SINGLE 34IN LL (TOURNIQUET CUFF) IMPLANT
CUFF TOURNIQUET SINGLE 44IN (TOURNIQUET CUFF) IMPLANT
DRAIN CHANNEL 19F RND (DRAIN) IMPLANT
DRAPE ORTHO SPLIT 77X108 STRL (DRAPES) ×4
DRAPE PROXIMA HALF (DRAPES) ×3 IMPLANT
DRAPE SURG ORHT 6 SPLT 77X108 (DRAPES) ×2 IMPLANT
DRAPE U-SHAPE 47X51 STRL (DRAPES) ×3 IMPLANT
DRSG ADAPTIC 3X8 NADH LF (GAUZE/BANDAGES/DRESSINGS) ×3 IMPLANT
ELECT REM PT RETURN 9FT ADLT (ELECTROSURGICAL) ×3
ELECTRODE REM PT RTRN 9FT ADLT (ELECTROSURGICAL) ×1 IMPLANT
EVACUATOR SILICONE 100CC (DRAIN) IMPLANT
GAUZE SPONGE 4X4 12PLY STRL (GAUZE/BANDAGES/DRESSINGS) ×3 IMPLANT
GLOVE BIO SURGEON STRL SZ 6.5 (GLOVE) ×4 IMPLANT
GLOVE BIO SURGEON STRL SZ7 (GLOVE) ×3 IMPLANT
GLOVE BIO SURGEON STRL SZ7.5 (GLOVE) ×6 IMPLANT
GLOVE BIO SURGEONS STRL SZ 6.5 (GLOVE) ×2
GLOVE BIOGEL PI IND STRL 6.5 (GLOVE) ×4 IMPLANT
GLOVE BIOGEL PI IND STRL 7.0 (GLOVE) ×1 IMPLANT
GLOVE BIOGEL PI IND STRL 8 (GLOVE) ×1 IMPLANT
GLOVE BIOGEL PI INDICATOR 6.5 (GLOVE) ×8
GLOVE BIOGEL PI INDICATOR 7.0 (GLOVE) ×2
GLOVE BIOGEL PI INDICATOR 8 (GLOVE) ×2
GLOVE ECLIPSE 6.5 STRL STRAW (GLOVE) ×3 IMPLANT
GOWN STRL REUS W/ TWL LRG LVL3 (GOWN DISPOSABLE) ×5 IMPLANT
GOWN STRL REUS W/TWL LRG LVL3 (GOWN DISPOSABLE) ×10
KIT BASIN OR (CUSTOM PROCEDURE TRAY) ×3 IMPLANT
KIT ROOM TURNOVER OR (KITS) ×3 IMPLANT
NS IRRIG 1000ML POUR BTL (IV SOLUTION) ×3 IMPLANT
PACK GENERAL/GYN (CUSTOM PROCEDURE TRAY) ×3 IMPLANT
PAD ARMBOARD 7.5X6 YLW CONV (MISCELLANEOUS) ×6 IMPLANT
PADDING CAST COTTON 6X4 STRL (CAST SUPPLIES) IMPLANT
SPONGE GAUZE 4X4 12PLY STER LF (GAUZE/BANDAGES/DRESSINGS) ×3 IMPLANT
STAPLER VISISTAT (STAPLE) ×3 IMPLANT
STOCKINETTE IMPERVIOUS LG (DRAPES) ×3 IMPLANT
SUT ETHILON 3 0 PS 1 (SUTURE) IMPLANT
SUT SILK 0 TIES 10X30 (SUTURE) ×3 IMPLANT
SUT SILK 2 0 (SUTURE) ×2
SUT SILK 2 0 SH CR/8 (SUTURE) ×3 IMPLANT
SUT SILK 2-0 18XBRD TIE 12 (SUTURE) ×1 IMPLANT
SUT SILK 3 0 (SUTURE) ×2
SUT SILK 3-0 18XBRD TIE 12 (SUTURE) ×1 IMPLANT
SUT VIC AB 2-0 CT1 18 (SUTURE) ×3 IMPLANT
TAPE UMBILICAL COTTON 1/8X30 (MISCELLANEOUS) ×3 IMPLANT
UNDERPAD 30X30 INCONTINENT (UNDERPADS AND DIAPERS) ×3 IMPLANT
WATER STERILE IRR 1000ML POUR (IV SOLUTION) ×3 IMPLANT

## 2015-05-26 NOTE — Anesthesia Postprocedure Evaluation (Signed)
  Anesthesia Post-op Note  Patient: Billy [name]  Procedure(s) Performed: Procedure(s): RIGHT ABOVE THE KNEE  AMPUTATION  (Right)  Patient Location: PACU  Anesthesia Type: General   Level of Consciousness: awake, alert  and oriented  Airway and Oxygen Therapy: Patient Spontanous Breathing  Post-op Pain: mild  Post-op Assessment: Post-op Vital signs reviewed  Post-op Vital Signs: Reviewed  Last Vitals:  Filed Vitals:   05/26/15 1630  BP: 142/84  Pulse: 92  Temp:   Resp: 17    Complications: No apparent anesthesia complications

## 2015-05-26 NOTE — Progress Notes (Signed)
ANTICOAGULATION CONSULT NOTE - Follow Up Consult  Pharmacy Consult for Heparin Indication: DVT  No Known Allergies  Patient Measurements: Height: 5\' 9"  (175.3 cm) Weight: 121 lb (54.885 kg) IBW/kg (Calculated) : 70.7 Heparin Dosing Weight: 54.8 kg  Vital Signs: Temp: 98.2 F (36.8 C) (11/04 0517) Temp Source: Oral (11/04 0517) BP: 150/91 mmHg (11/04 0517) Pulse Rate: 113 (11/04 0517)  Labs:  Recent Labs  05/24/15 0535 05/25/15 0620 05/25/15 1839 05/25/15 1940 05/26/15 0518  HGB 11.9* 11.2* 11.5*  --  11.0*  HCT 36.2* 34.6* 35.4*  --  33.7*  PLT 416* 467* 422*  --  169  HEPARINUNFRC  --   --   --  <0.10* <0.10*  CREATININE 0.89 0.78  --   --   --     Estimated Creatinine Clearance: 56.2 mL/min (by C-G formula based on Cr of 0.78).   Medications:  Infusions:  . sodium chloride    . heparin 1,000 Units/hr (05/25/15 2044)    Assessment: 81 year old male on IV heparin for non-occlusive RUE DVT admitted with RLE ischemia and plans for R-AKA on Friday 11/4. Note patient history of rectal bleeding. RN states that pt with large bowel movement 11/4 a.m. with no blood. Heparin level remains undetectable on 1000 units/hr. Hgb remains stable, plt down to 169. No issues with line or bleeding reported per RN.  Goal of Therapy:  Heparin level 0.3-0.7 units/ml Monitor platelets by anticoagulation protocol: Yes   Plan:  Increase heparin to 1250 units/hr  Recheck heparin level in 6 hours F/u platelet trend closely  Christoper Fabianaron Audreana Hancox, PharmD, BCPS Clinical pharmacist, pager [phone] 05/26/2015,7:15 AM

## 2015-05-26 NOTE — Progress Notes (Signed)
OT Cancellation Note  Patient Details Name: Billy KiefSylvester [name] MRN: [id number] DOB: Jun 15, 1932   Cancelled Treatment:    Reason Eval/Treat Not Completed: Patient at procedure or test/ unavailable - Pt gone for Rt AKA.  Will need reorder post op to resume OT.   [name], [name]  [name], [name] [phone]  05/26/2015, 2:34 PM

## 2015-05-26 NOTE — Clinical Social Work Note (Signed)
CSW provided [name] at [facility] with an update regarding the pt's medical status (pt's is having a right AKA). [name] informed the CSW that she does not have any beds until Monday. [name] shared that she will follow up on Monday regarding discharge needs. CSW will continue to follow and assist.    Clinical Social Worker  Naiomi Musto, MSW, LCSW [phone]

## 2015-05-26 NOTE — Op Note (Signed)
    NAME: Billy [name]   MRN: [id number] DOB: 05-06-1932    DATE OF OPERATION: 05/26/2015  PREOP DIAGNOSIS: ischemic right foot  POSTOP DIAGNOSIS: same  PROCEDURE: Right above-the-knee amputation  SURGEON: Di Kindlehristopher S. Edilia Boickson, MD, FACS  ASSIST: Karsten RoKim Trinh, [facility]  ANESTHESIA: Gen.   EBL: minimal  INDICATIONS: Billy KiefSylvester Kinderman is a 81 y.o. male who developed a profoundly ischemic right foot that was not salvageable. He had a right knee contracture and was not felt to be a candidate for low the knee amputation given his debilitated state, knee contracture, and malnutrition and increased risk of not healing. Therefore I recommended right above-the-knee amputation.  FINDINGS: The muscle was well perfused.  TECHNIQUE: The patient was taken to the operating room and received a general anesthetic. The right lower extremity was prepped and draped in usual sterile fashion. A fishmouth incision was marked above the level of the patella. The tourniquet had been placed on the upper thigh. The leg was exsanguinated with an Esmarch bandage and the tourniquet inflated to 300 mmHg. Under tourniquet control, the incision was carried down to the skin, subcutaneous tissue, fascia, and muscle to the femur which was dissected free circumferentially. The periosteum was elevated. The bone was divided proximal to the level of skin division. Next the femoral artery and vein were individually suture ligated with 2-0 silk ties. The tourniquet was then released. Additional hemostasis was obtained using electrocautery and 2-0 silk ties. The sciatic nerve was retracted into the wound, ligated, and then sharply divided. The wound was irrigated with copious amounts of saline. The edges of the femur were rasped. The fascial layer was closed with interrupted 2-0 Vicryl's. The skin was closed with staples. Sterile dressing was applied. The patient tolerated the procedure well and transferred to the recovery room in stable  condition. All needle and sponge counts were correct.  Waverly Ferrarihristopher Mandie Crabbe, MD, FACS [facility]  DATE OF DICTATION:   05/26/2015

## 2015-05-26 NOTE — H&P (View-Only) (Signed)
[facility] Consult VASCULAR SURGERY :  Agree with note below. I do not think that the right foot is salvageable. It has reportedly been hurting for 5 days. He has markedly decreased motor and sensory function. He has a non-retrievable capillary bed. In addition he is markedly debilitated, has dementia, severe protein malnutrition, and recent GI bleed. Given his debilitated state and contracture of the right knee, I would recommend right AKA. He cannot give his own consent. I tried to call the one number that I have for family, but there is no answer and no voicemail box set up. If consent can be obtained, I can proceed with right AKA tomorrow.  Damean Poffenberger, MD, FACS Beeper [phone] Office: [phone]   Reason for ConsulWaverly [name]:  Cold right foot Referring Physician:  Osvaldo ShipperGokul Krishnan, MD MRN #:  [id number]  History of Present Illness: This is a [age] y.o. male who we've been consulted on regarding a painful, cold right foot. The patient has a history of dementia and is a poor historian. There is no family at the bedside. I did speak his niece Billy [name], who reports that the patient started complaining of right foot pain and right arm pain since 5 days ago. The niece reports that the patient was unwilling to put weight on that foot secondary to pain. She says that he is usually ambulatory with a cane.  He denies any prior history of pain in his legs or feet. He lives at home with caretakers. He reports falling recently, but unable to report exactly when. He is unable to say why he was admitted to the hospital.  He was admitted to the hospital on 05/17/15 for dehydration and worsening rectal bleeding that has been present for the past 3 weeks. He has a history of diverticulosis and external hemorrhoids. He underwent colonoscopy on this admission which showed mild to moderate colitis. He was started on mesalamine and oral prednisone.   Also during this admission, he had  an episode of LOC while having a bowel movement. He fell on his right-side and has been complaining of right sided pain. X-rays have been unremarkable. A venous duplex today revealed acute DVT in the right axillary vein.   He has a past medical history of hypertension, hyperlipidemia, Alzheimer's and BPH. He has no history of cardiac arrhythmias or cardiac disease. He has no history of CVA or diabetes. He is a former smoker.   Past Medical History  Diagnosis Date  . Hypertension   . Hyperlipidemia   . Alzheimer's disease   . HTN (hypertension) 11/21/2014  . Dementia 11/21/2014  . BPH (benign prostatic hyperplasia) 11/21/2014   Past Surgical History  Procedure Laterality Date  . Colonoscopy N/A 05/19/2015    Procedure: COLONOSCOPY;  Surgeon: Vida RiggerMarc Magod, MD;  [facility];  Service: Endoscopy;  Laterality: N/A;    No Known Allergies  Prior to Admission medications   Medication Sig Start Date End Date Taking? Authorizing Provider  donepezil (ARICEPT) 5 MG tablet Take 1 tablet by mouth at bedtime. 10/25/14  Yes Historical Provider, MD  finasteride (PROSCAR) 5 MG tablet Take 1 tablet by mouth daily. 10/25/14  Yes Historical Provider, MD  mirtazapine (REMERON) 7.5 MG tablet Take 1 tablet by mouth at bedtime. 11/02/14  Yes Historical Provider, MD  NIFEdipine (PROCARDIA-XL/ADALAT-CC/NIFEDICAL-XL) 30 MG 24 hr tablet Take 30 mg by mouth daily. 10/25/14  Yes Historical Provider, MD  docusate sodium (COLACE) 100 MG capsule Take 1 capsule (100 mg total) by  mouth 2 (two) times daily. Patient not taking: Reported on 05/17/2015 12/02/14   Rhetta Mura, MD  hydrocortisone (ANUSOL-HC) 25 MG suppository Place 1 suppository (25 mg total) rectally 2 (two) times daily. Patient not taking: Reported on 05/17/2015 12/02/14   Rhetta Mura, MD  Mesalamine (ASACOL) 400 MG CPDR DR capsule Take 2 capsules (800 mg total) by mouth 3 (three) times daily. 05/21/15   Rhetta Mura, MD  predniSONE  (DELTASONE) 20 MG tablet Take 1 tablet (20 mg total) by mouth daily with breakfast. 05/24/15   Osvaldo Shipper, MD    Social History   Social History  . Marital Status: Married    Spouse Name: N/A  . Number of Children: N/A  . Years of Education: N/A   Occupational History  . Not on file.   Social History Main Topics  . Smoking status: Former Games developer  . Smokeless tobacco: Not on file  . Alcohol Use: No  . Drug Use: No  . Sexual Activity: Not on file   Other Topics Concern  . Not on file   Social History Narrative   History reviewed. No pertinent family history.  ROS:  Positive    Negative    All sytems reviewed and are negative ROS limited due to patient's dementia.   Cardiovascular:  chest pain/pressure  palpitations  SOB lying flat  DOE  pain in legs while walking  pain in legs at rest  pain in legs at night  non-healing ulcers  hx of DVT  swelling in legs  Pulmonary:  productive cough  asthma/wheezing  home O2  Neurologic:  weakness in  arms  legs  numbness in  arms  legs  hx of CVA  mini stroke difficulty speaking or slurred speech  temporary loss of vision in one eye  dizziness  Hematologic:  hx of cancer  bleeding problems  problems with blood clotting easily  Endocrine:    diabetes  thyroid disease  GI  vomiting blood  blood in stool  GU:  CKD/renal failure  HD--[]  M/W/F or  T/T/S  burning with urination  blood in urine  Psychiatric:  anxiety  depression  Musculoskeletal:  arthritis  joint pain  Integumentary:  rashes  ulcers  Constitutional:  fever  chills   Physical Examination  Filed Vitals:   05/25/15 0500  BP: 136/95  Pulse: 112  Temp: 101.3 F (38.5 C)  Resp: 18   Body mass index is 17.86 kg/(m^2).  General:  Thin male in NAD Gait: Not observed HENT: WNL, normocephalic Pulmonary: normal non-labored breathing,  without Rales, rhonchi,  wheezing Cardiac: sinus tachy, no carotid bruits Abdomen: soft, NT/ND, no masses Vascular Exam/Pulses:  Right Left  Radial 2+ (normal) 2+ (normal)  Femoral 2+ (normal) 2+ (normal)  Popliteal absent 2+ (normal)  DP Absent doppler flow 2+ (normal)  PT Absent doppler flow Non palpable   Extremities: right foot is cool is cyanotic changes to toes, no wounds seen. Severe pain to palpation right foot.  Left foot pink and warm. Right arm contracted.  Musculoskeletal: muscle wasting and atrophy noted  Neurologic: markedly diminished motor and sensory function to right foot. Unable to move toes. Motor and sensory intact left foot.   CBC    Component Value Date/Time   WBC 15.9* 05/25/2015 0620   RBC 3.93* 05/25/2015 0620   HGB 11.2* 05/25/2015 0620   HCT 34.6* 05/25/2015 0620   PLT 467* 05/25/2015 0620   MCV 88.0 05/25/2015 0620   MCH 28.5  05/25/2015 0620   MCHC 32.4 05/25/2015 0620   RDW 14.7 05/25/2015 0620   LYMPHSABS 1.4 05/24/2015 0535   MONOABS 2.4* 05/24/2015 0535   EOSABS 0.6 05/24/2015 0535   BASOSABS 0.0 05/24/2015 0535    BMET    Component Value Date/Time   NA 147* 05/25/2015 0620   K 3.4* 05/25/2015 0620   CL 113* 05/25/2015 0620   CO2 23 05/25/2015 0620   GLUCOSE 99 05/25/2015 0620   BUN 13 05/25/2015 0620   CREATININE 0.78 05/25/2015 0620   CALCIUM 8.4* 05/25/2015 0620   GFRNONAA >60 05/25/2015 0620   GFRAA >60 05/25/2015 0620    COAGS: Lab Results  Component Value Date   INR 1.5 02/26/2008   INR 1.6* 02/25/2008   INR 1.6* 02/24/2008    Statin:  No. Beta Blocker:  No. Aspirin:  No. ACEI:  No. ARB:  No. Other antiplatelets/anticoagulants:  No.   ASSESSMENT: This is a 81 y.o. male with ischemia of the right lower extremity, acute right axillary DVT, rectal bleeding, diverticulosis and dementia.   PLAN: The patient has no doppler flow detected in the right foot. It is cool with cyanotic changes.  He has complained of right  foot pain for the past 5 days. He has severely diminished motor and sensory function of his right foot. There is a right femoral pulse. He has normal pulses on the left. The patient has no history of cardiac arrhythmias suggesting acute embolic event. He is normally ambulatory with a cane. He does not describe a history consistent with chronic peripheral vascular disease.   The patient did eat breakfast this morning. He will need an arteriogram versus urgent surgical exploration. Dr. Edilia Bo to evaluate patient and make recommendations.    Maris Berger, PA-C [facility] Office: [phone] Pager: [phone]

## 2015-05-26 NOTE — Interval H&P Note (Signed)
History and Physical Interval Note:  05/26/2015 12:52 PM  Billy KiefSylvester Stinger  has presented today for surgery, with the diagnosis of Ischemia right lower extremity M62.89  The various methods of treatment have been discussed with the patient and family. After consideration of risks, benefits and other options for treatment, the patient has consented to  Procedure(s):  AMPUTATION ABOVE RIGHT KNEE (Right) as a surgical intervention .  The patient's history has been reviewed, patient examined, no change in status, stable for surgery.  I have reviewed the patient's chart and labs.  Questions were answered to the patient's satisfaction.     Waverly Ferrariickson, Adalea Handler

## 2015-05-26 NOTE — Progress Notes (Addendum)
[facility] PROGRESS NOTE  Billy [name] AVW:[id number] DOB: 1931/09/10 DOA: 05/17/2015  PCP: [name], MD  Brief HPI: 81 year old African-American male with a past medical history of dementia, diverticulosis, hypertension, presented with rectal bleeding. Underwent colonoscopy which revealed colitis. This was suggestive of inflammatory bowel disease. Subsequently noted to have right lower extremity ischemia. Also found to have a DVT in the right upper extremity.  Past medical history:  Past Medical History  Diagnosis Date  . Hypertension   . Hyperlipidemia   . Alzheimer's disease   . HTN (hypertension) 11/21/2014  . Dementia 11/21/2014  . BPH (benign prostatic hyperplasia) 11/21/2014    Consultants: [facility] Sprang gastroenterology, vascular surgery  Procedures:  Colonoscopy 10/28 IMPRESSIONS:  1.mild to moderate colitis throughout status post biopsy seemingly sparing the cecum and proximal ascending and most distal rectum  2. primarily sigmoid small diverticuli with few scattered throughout the remainder of the colon  3. transverse and descending small polyp status post cold biopsy  4. normal terminal ileum  5. otherwise within normal limits as above with cecum and parts of ascending transverse and sigmoid not well seen  EEG "Interpretation: This EEG is abnormal with moderately severe generalized continuous nonspecific slowing of cerebral activity consistent with moderately severe encephalopathic state. Significance of triphasic sharp waves, which are most often associated with metabolic encephalopathy, is unclear. No frank seizure discharges were recorded."  Antibiotics: Vancomycin and Zosyn 11/3  Subjective: Patient unable to communicate effectively due to his dementia. When directly asked about his right foot, he does state that he has pain in that area. Otherwise history remains limited.   Objective: Vital Signs  Filed Vitals:   05/25/15 0500 05/25/15 1327  05/25/15 2246 05/26/15 0517  BP: 136/95 116/87 146/85 150/91  Pulse: 112 109 112 113  Temp: 101.3 F (38.5 C) 99.3 F (37.4 C) 98.7 F (37.1 C) 98.2 F (36.8 C)  TempSrc: Oral Oral Oral Oral  Resp: Height:      Weight:      SpO2: 100% 100% 99% 100%    Intake/Output Summary (Last 24 hours) at 05/26/15 0947 Last data filed at 05/26/15 0824  Gross per 24 hour  Intake    596 ml  Output    175 ml  Net    421 ml   Filed Weights   05/17/15 2127 05/19/15 1302  Weight: 54.9 kg (121 lb 0.5 oz) 54.885 kg (121 lb)    General appearance: alert, distracted and no distress Resp: clear to auscultation bilaterally Cardio: regular rate and rhythm, S1, S2 normal, no murmur, click, rub or gallop GI: soft, non-tender; bowel sounds normal; no masses,  no organomegaly Extremities: Right upper extremity is contracted. No obvious swelling or deformity noted. Some warmth in the right elbow without any swelling. Examination of the right foot reveals that it's cold to touch and dusky in appearance. Pulses are nonpalpable in the dorsalis pedis or posterior tibial area. Unchanged from yesterday. Neurologic: Advanced dementia. No other focal deficits. Contracted right upper extremity. It is chronic per his neice.  Lab Results:  Basic Metabolic Panel:  Recent Labs Lab 05/20/15 0941 05/24/15 0535 05/25/15 0620 05/26/15 0750  NA 139 146* 147* 148*  K 3.6 3.4* 3.4* 3.0*  CL 104 112* 113* 110  CO2 21*  GLUCOSE 79 95 99 95  BUN CREATININE 1.37* 0.89 0.78 1.11  CALCIUM 8.5* 8.4* 8.4* 8.7*   Liver Function Tests:  Recent Labs  Lab 05/20/15 0941  AST 26  ALT 17  ALKPHOS 45  BILITOT 0.6  PROT 6.6  ALBUMIN 2.7*   CBC:  Recent Labs Lab 05/20/15 0941 05/24/15 0535 05/25/15 0620 05/25/15 1839 05/26/15 0518  WBC 9.9 16.8* 15.9* 17.5* 11.1*  NEUTROABS 7.7 12.4*  --   --   --   HGB 12.7* 11.9* 11.2* 11.5* 11.0*  HCT 37.3* 36.2* 34.6* 35.4* 33.7*  MCV  85.0 87.9 88.0 86.6 84.9  PLT 323 416* 467* 422* 169   Cardiac Enzymes:  Recent Labs Lab 05/20/15 0941 05/20/15 1338 05/20/15 1937  TROPONINI 0.08* 0.08* 0.09*    CBG:  Recent Labs Lab 05/19/15 2135  GLUCAP 89    Recent Results (from the past 240 hour(s))  C difficile quick scan w PCR reflex     Status: None   Collection Time: 05/18/15  1:55 AM  Result Value Ref Range Status   C Diff antigen NEGATIVE NEGATIVE Final   C Diff toxin NEGATIVE NEGATIVE Final   C Diff interpretation Negative for toxigenic C. difficile  Final  Surgical pcr screen     Status: None   Collection Time: 05/26/15  2:35 AM  Result Value Ref Range Status   MRSA, PCR NEGATIVE NEGATIVE Final   Staphylococcus aureus NEGATIVE NEGATIVE Final    Comment:        The Xpert SA Assay (FDA approved for NASAL specimens in patients over 40 years of age), is one component of a comprehensive surveillance program.  Test performance has been validated by [facility] for patients greater than or equal to 23 year old. It is not intended to diagnose infection nor to guide or monitor treatment.       Studies/Results: Dg Foot Complete Right  05/24/2015  CLINICAL DATA:  Right foot tenderness.  Altered mental status. EXAM: RIGHT FOOT COMPLETE - 3+ VIEW COMPARISON:  None. FINDINGS: Slightly motion degraded AP image. No fracture or dislocation. Lisfranc joint appears intact. No aggressive-appearing focal osseous lesions. No appreciable degenerative or erosive arthropathy. Small enthesophyte at the lateral base of right fifth metatarsal. Tiny plantar and Achilles right calcaneal spurs. IMPRESSION: No fracture or dislocation.  Enthesophytes as described. Electronically Signed   By: Delbert Phenix M.D.   On: 05/24/2015 19:06    Medications:  Scheduled: .  ceFAZolin (ANCEF) IV  1 g Intravenous To OR  . donepezil  5 mg Oral QHS  . finasteride  5 mg Oral Daily  . haloperidol lactate  2.5 mg Intravenous Once  . Mesalamine   800 mg Oral TID  . mirtazapine  7.5 mg Oral QHS  . piperacillin-tazobactam (ZOSYN)  IV  3.375 g Intravenous 3 times per day  . predniSONE  20 mg Oral Q breakfast  . sodium chloride  3 mL Intravenous Q12H  . vancomycin  1,000 mg Intravenous Q24H   Continuous: . sodium chloride    . heparin 1,250 Units/hr (05/26/15 0721)   ZOX:[id number] **OR** acetaminophen, haloperidol lactate, HYDROcodone-acetaminophen, ondansetron **OR** ondansetron (ZOFRAN) IV  Assessment/Plan:  Principal Problem:   Ischemic foot Active Problems:   HTN (hypertension)   Dementia   Protein-calorie malnutrition, severe (HCC)   Alzheimer's disease   Lower GI bleed   DVT of right axillary vein, acute    Right lower extremity ischemia Appreciate vascular surgery input. Patient has been offered her right above-knee amputation as apparently that foot cannot be salvaged. According to his niece, his foot has been cold to touch at least since Monday. Due to  patient's dementia history has been difficult to obtain.  Long discussion with patient's power of attorney, Ms. Guinevere FerrariJean Fuller at [phone] who is patient's POA on 11/3. She has multiple concerns about patient's care. It doesn't appear that medical staff made aware of any problem with right foot before 11/2. Apparently the right foot has been cold since 10/28 per family.   DVT in the right axillary vein This was detected on venous Doppler study. Intravenous heparin has been initiated. Due to recent GI bleed, we need to be cautious with this approach. Seems to be tolerating it quite well. No evidence for any bleeding.  Fever with leukocytosis Source could be the right foot, although there is no obvious infection noted. Continue broad-spectrum coverage for now. Await blood cultures. WBC is improved. Fever appears to have subsided.   Rectal bleeding Patient was seen by gastroenterology. He underwent colonoscopy which shows mild-to-moderate colitis. Etiology shows  severely active chronic colitis without any evidence for dysplasia. Inflammatory bowel disease was thought to be likely. Patient was started on mesalamine. Oral prednisone also appears to have been recommended by gastroenterology. According to nursing reports from 11/2, minimal amount of bleeding was noted with bowel movement. This is most likely old blood. Hemoglobin has been stable.   Loss of consciousness 10/28 This occurred a few days ago in the hospital while he was having a bowel movement. Could have been vasovagal. Has been stable since then. Workup has been negative so far. We will also get an echocardiogram.  Right-sided pain During his loss of consciousness he fell on his right side, which could explain the right-sided pain that he has been experiencing. X-rays of the right upper extremities were unremarkable. Uric acid is normal.   Diverticulosis seen on colonoscopy Avoid constipation.   History of dementia Continue home medications. Appears to be moderate to advanced based on examination.  History of essential hypertension Continue nifedipine  Hypokalemia Repleted.  DVT Prophylaxis: SCDs    Code Status: Full code  Family Communication: Discussed with his niece and POA Guinevere FerrariJean Fuller at [phone] (H). Cell [phone]. Disposition Plan: Continue management as above. Await surgery.     LOS: 4 days   [facility] CenterKRISHNAN,Billy [name]  [facility] Pager [phone] 05/26/2015, 9:47 AM  If 7PM-7AM, please contact night-coverage at [email or web address], password [email or web address]

## 2015-05-26 NOTE — Progress Notes (Signed)
Pt returned from OR at 1600. VSS. Dressing in place, clean dry and intact, with no bleeding or drainage visible. No complaints of pain. PACU states pt has not yet voided in the post op period. Will continue to monitor.

## 2015-05-26 NOTE — Anesthesia Preprocedure Evaluation (Signed)
Anesthesia Evaluation  Patient identified by MRN, date of birth, ID band Patient confused    Reviewed: Allergy & Precautions, NPO status , Patient's Chart, lab work & pertinent test results  History of Anesthesia Complications Negative for: history of anesthetic complications  Airway Mallampati: IV  TM Distance: >3 FB Neck ROM: Full  Mouth opening: Limited Mouth Opening  Dental  (+) Loose, Missing, Poor Dentition   Pulmonary former smoker,    breath sounds clear to auscultation       Cardiovascular hypertension, Pt. on medications + Peripheral Vascular Disease  (-) CHF (-) dysrhythmias  Rhythm:Regular     Neuro/Psych PSYCHIATRIC DISORDERS negative neurological ROS     GI/Hepatic negative GI ROS, Neg liver ROS,   Endo/Other  negative endocrine ROS  Renal/GU negative Renal ROS     Musculoskeletal   Abdominal   Peds  Hematology  (+) anemia ,   Anesthesia Other Findings   Reproductive/Obstetrics                             Anesthesia Physical Anesthesia Plan  ASA: III  Anesthesia Plan: General   Post-op Pain Management:    Induction: Intravenous  Airway Management Planned: LMA  Additional Equipment: None  Intra-op Plan:   Post-operative Plan: Extubation in OR  Informed Consent: I have reviewed the patients History and Physical, chart, labs and discussed the procedure including the risks, benefits and alternatives for the proposed anesthesia with the patient or authorized representative who has indicated his/her understanding and acceptance.   Dental advisory given  Plan Discussed with: CRNA and Surgeon  Anesthesia Plan Comments:         Anesthesia Quick Evaluation

## 2015-05-26 NOTE — Transfer of Care (Signed)
Immediate Anesthesia Transfer of Care Note  Patient: Billy [name]  Procedure(s) Performed: Procedure(s): RIGHT ABOVE THE KNEE  AMPUTATION  (Right)  Patient Location: PACU  Anesthesia Type:General  Level of Consciousness: awake and alert   Airway & Oxygen Therapy: Patient Spontanous Breathing and Patient connected to nasal cannula oxygen  Post-op Assessment: Report given to RN and Post -op Vital signs reviewed and stable  Post vital signs: Reviewed and stable  Last Vitals:  Filed Vitals:   05/26/15 1554  BP: 136/90  Pulse: 87  Temp: 36.6 C  Resp: 17    Complications: No apparent anesthesia complications

## 2015-05-27 DIAGNOSIS — E876 Hypokalemia: Secondary | ICD-10-CM

## 2015-05-27 LAB — BASIC METABOLIC PANEL
ANION GAP: 15 (ref 5–15)
Anion gap: 14 (ref 5–15)
BUN: 15 mg/dL (ref 6–20)
BUN: 11 mg/dL (ref 6–20)
CALCIUM: 8.4 mg/dL — AB (ref 8.9–10.3)
CHLORIDE: 111 mmol/L (ref 101–111)
CO2: 20 mmol/L — AB (ref 22–32)
CO2: 19 mmol/L — AB (ref 22–32)
Calcium: 8.1 mg/dL — ABNORMAL LOW (ref 8.9–10.3)
Chloride: 112 mmol/L — ABNORMAL HIGH (ref 101–111)
Creatinine, Ser: 1.14 mg/dL (ref 0.61–1.24)
Creatinine, Ser: 1.03 mg/dL (ref 0.61–1.24)
GFR calc Af Amer: >60 mL/min (ref >60)
GFR calc non Af Amer: 58 mL/min — ABNORMAL LOW (ref >60)
GLUCOSE: 113 mg/dL — AB (ref 65–99)
Glucose, Bld: 75 mg/dL (ref 65–99)
POTASSIUM: 2.8 mmol/L — AB (ref 3.5–5.1)
POTASSIUM: 4.5 mmol/L (ref 3.5–5.1)
SODIUM: 145 mmol/L (ref 135–145)
Sodium: 146 mmol/L — ABNORMAL HIGH (ref 135–145)

## 2015-05-27 LAB — CBC
HEMATOCRIT: 30.2 % — AB (ref 39.0–52.0)
HEMOGLOBIN: 9.7 g/dL — AB (ref 13.0–17.0)
MCH: 28 pg (ref 26.0–34.0)
MCHC: 32.1 g/dL (ref 30.0–36.0)
MCV: 87 fL (ref 78.0–100.0)
Platelets: 501 10*3/uL — ABNORMAL HIGH (ref 150–400)
RBC: 3.47 MIL/uL — AB (ref 4.22–5.81)
RDW: 14.9 % (ref 11.5–15.5)
WBC: 14.3 10*3/uL — ABNORMAL HIGH (ref 4.0–10.5)

## 2015-05-27 LAB — HEPARIN LEVEL (UNFRACTIONATED)
Heparin Unfractionated: <0.1 IU/mL — ABNORMAL LOW (ref 0.30–0.70)
Heparin Unfractionated: 0.25 IU/mL — ABNORMAL LOW (ref 0.30–0.70)

## 2015-05-27 MED ORDER — POTASSIUM CHLORIDE CRYS ER 20 MEQ PO TBCR
40.0000 meq | EXTENDED_RELEASE_TABLET | Freq: Once | ORAL | Status: AC
  Administered 2015-05-27: 40 meq via ORAL
  Filled 2015-05-27: qty 2

## 2015-05-27 MED ORDER — NIFEDIPINE ER 30 MG PO TB24
30.0000 mg | ORAL_TABLET | Freq: Every day | ORAL | Status: DC
  Administered 2015-05-27 – 2015-05-29 (×3): 30 mg via ORAL
  Filled 2015-05-27 (×6): qty 1

## 2015-05-27 MED ORDER — VANCOMYCIN HCL IN DEXTROSE 750-5 MG/150ML-% IV SOLN
750.0000 mg | INTRAVENOUS | Status: DC
  Administered 2015-05-27: 750 mg via INTRAVENOUS
  Filled 2015-05-27 (×3): qty 150

## 2015-05-27 MED ORDER — HEPARIN (PORCINE) IN NACL 100-0.45 UNIT/ML-% IJ SOLN
1250.0000 [IU]/h | INTRAMUSCULAR | Status: AC
  Administered 2015-05-27: 1250 [IU]/h via INTRAVENOUS
  Administered 2015-05-28 – 2015-05-29 (×2): 1350 [IU]/h via INTRAVENOUS
  Filled 2015-05-27 (×3): qty 250

## 2015-05-27 MED ORDER — POTASSIUM CHLORIDE CRYS ER 20 MEQ PO TBCR: 40.0000 meq | EXTENDED_RELEASE_TABLET | ORAL | Status: DC

## 2015-05-27 MED ORDER — POTASSIUM CHLORIDE 10 MEQ/100ML IV SOLN
10.0000 meq | INTRAVENOUS | Status: AC
  Administered 2015-05-27 (×3): 10 meq via INTRAVENOUS
  Filled 2015-05-27 (×4): qty 100

## 2015-05-27 MED ORDER — HYDRALAZINE HCL 20 MG/ML IJ SOLN: 10.0000 mg | Freq: Four times a day (QID) | INTRAMUSCULAR | Status: DC | PRN

## 2015-05-27 NOTE — Progress Notes (Signed)
ANTICOAGULATION CONSULT NOTE - Follow Up Consult  Pharmacy Consult for Heparin/vancomycin and zosyn Indication: DVT  No Known Allergies  Patient Measurements: Height: 5\' 9"  (175.3 cm) Weight: 121 lb (54.885 kg) IBW/kg (Calculated) : 70.7 Heparin Dosing Weight: 54.8 kg  Vital Signs: Temp: 98.6 F (37 C) (11/05 0653) Temp Source: Oral (11/05 0653) BP: 185/81 mmHg (11/05 0653) Pulse Rate: 98 (11/05 0653)  Labs:  Recent Labs  05/25/15 0620 05/25/15 1839 05/25/15 1940 05/26/15 0518 05/26/15 0750 05/27/15 0526  HGB 11.2* 11.5*  --  11.0*  --  9.7*  HCT 34.6* 35.4*  --  33.7*  --  30.2*  PLT 467* 422*  --  169  --  501*  HEPARINUNFRC  --   --  <0.10* <0.10*  --  <0.10*  CREATININE 0.78  --   --   --  1.11 1.14    Estimated Creatinine Clearance: 39.5 mL/min (by C-G formula based on Cr of 1.14).   Medications:  Infusions:  . sodium chloride      Assessment: 81 year old male with RUE venous duplex revealed acute, non occlusive DVT noted in the right axillary vein beginning at the origin in the axilla, extending into the chest. S/p R- AKA 11/4, plan to restart heparin 11/5 @ 2PM w/o bolus. He was on 1250 units/hr prior to surgery.  Also noted he is on Vanc/zosyn for ischemic right foot with possible infection, fever with leukocytosis WBC 11.1 > 14.3 (on prednisone), now afeb. Not sure how long to treat. S/p AKA, ordered to recheck weight. Scr 0.78 > 1.14, est.   Vanc 11/3>> Zosyn 11/3>>  11/3 Blood Cx X2 sent 10/27 Cdiff antigen and toxin negative  Goal of Therapy:  Heparin level 0.3-0.7 units/ml Monitor platelets by anticoagulation protocol: Yes   Plan:  -Start IV heparin 1250units/hr with no bolus at 1400 -f/u 8 hr heparin level at 2200 -Daily heparin level and CBC -Zosyn 3.375 gm IV q8h (extended 4 hour infusion) -Decrease Vancomycin 750 mg IV q24h  -monitor renal function, clinical progression, trough if continues  Bayard HuggerMei Wandy Bossler, PharmD, BCPS  Clinical  Pharmacist  Pager: [phone]   05/27/2015,10:44 AM

## 2015-05-27 NOTE — Progress Notes (Addendum)
[facility] PROGRESS NOTE  Billy [name] JYN:[id number] DOB: 11-Jan-1932 DOA: 05/17/2015  PCP: [name], MD  Brief HPI: 81 year old African-American male with a past medical history of dementia, diverticulosis, hypertension, presented with rectal bleeding. Underwent colonoscopy which revealed colitis. This was suggestive of inflammatory bowel disease. Subsequently noted to have right lower extremity ischemia. Also found to have a DVT in the right upper extremity. He underwent right above-knee amputation.  Past medical history:  Past Medical History  Diagnosis Date  . Hypertension   . Hyperlipidemia   . Alzheimer's disease   . HTN (hypertension) 11/21/2014  . Dementia 11/21/2014  . BPH (benign prostatic hyperplasia) 11/21/2014    Consultants: [facility] Sprang gastroenterology, vascular surgery  Procedures:  Colonoscopy 10/28 IMPRESSIONS:  1.mild to moderate colitis throughout status post biopsy seemingly sparing the cecum and proximal ascending and most distal rectum  2. primarily sigmoid small diverticuli with few scattered throughout the remainder of the colon  3. transverse and descending small polyp status post cold biopsy  4. normal terminal ileum  5. otherwise within normal limits as above with cecum and parts of ascending transverse and sigmoid not well seen  EEG "Interpretation: This EEG is abnormal with moderately severe generalized continuous nonspecific slowing of cerebral activity consistent with moderately severe encephalopathic state. Significance of triphasic sharp waves, which are most often associated with metabolic encephalopathy, is unclear. No frank seizure discharges were recorded."  Right AKA 11/4  Antibiotics: Vancomycin and Zosyn 11/3  Subjective: Patient unable to communicate effectively due to his dementia. No bleeding reported by RN.   Objective: Vital Signs  Filed Vitals:   05/26/15 2351 05/27/15 0101 05/27/15 0126 05/27/15 0653  BP:  178/83 166/53 157/83 185/81  Pulse: 105 92 93 98  Temp:    98.6 F (37 C)  TempSrc:    Oral  Resp:    20  Height:      Weight:      SpO2:  52% 98% 100%    Intake/Output Summary (Last 24 hours) at 05/27/15 1039 Last data filed at 05/27/15 0849  Gross per 24 hour  Intake   1360 ml  Output    275 ml  Net   1085 ml   Filed Weights   05/17/15 2127 05/19/15 1302  Weight: 54.9 kg (121 lb 0.5 oz) 54.885 kg (121 lb)    General appearance: alert, distracted and no distress Resp: clear to auscultation bilaterally Cardio: regular rate and rhythm, S1, S2 normal, no murmur, click, rub or gallop GI: soft, non-tender; bowel sounds normal; no masses,  no organomegaly Extremities: Right upper extremity is contracted. No obvious swelling or deformity noted. He is now status post right AKA Neurologic: Disoriented. No other focal deficits. Contracted right upper extremity. It is chronic per his neice.  Lab Results:  Basic Metabolic Panel:  Recent Labs Lab 05/24/15 0535 05/25/15 0620 05/26/15 0750 05/27/15 0526  NA 146* 147* 148* 145  K 3.4* 3.4* 3.0* 2.8*  CL 112* 113* 110 111  CO2 22 23 21* 20*  GLUCOSE 95 99 95 75  BUN CREATININE 0.89 0.78 1.11 1.14  CALCIUM 8.4* 8.4* 8.7* 8.1*   CBC:  Recent Labs Lab 05/24/15 0535 05/25/15 0620 05/25/15 1839 05/26/15 0518 05/27/15 0526  WBC 16.8* 15.9* 17.5* 11.1* 14.3*  NEUTROABS 12.4*  --   --   --   --   HGB 11.9* 11.2* 11.5* 11.0* 9.7*  HCT 36.2* 34.6* 35.4* 33.7* 30.2*  MCV 87.9 88.0 86.6  84.9 87.0  PLT 416* 467* 422* 169 501*   Cardiac Enzymes:  Recent Labs Lab 05/20/15 1338 05/20/15 1937  TROPONINI 0.08* 0.09*    CBG: No results for input(s): GLUCAP in the last 168 hours.  Recent Results (from the past 240 hour(s))  C difficile quick scan w PCR reflex     Status: None   Collection Time: 05/18/15  1:55 AM  Result Value Ref Range Status   C Diff antigen NEGATIVE NEGATIVE Final   C Diff toxin NEGATIVE  NEGATIVE Final   C Diff interpretation Negative for toxigenic C. difficile  Final  Culture, blood (routine x 2)     Status: None (Preliminary result)   Collection Time: 05/25/15  6:14 AM  Result Value Ref Range Status   Specimen Description BLOOD LEFT ANTECUBITAL  Final   Special Requests BOTTLES DRAWN AEROBIC ONLY 10CCS  Final   Culture NO GROWTH 2 DAYS  Final   Report Status PENDING  Incomplete  Culture, blood (routine x 2)     Status: None (Preliminary result)   Collection Time: 05/25/15  6:20 AM  Result Value Ref Range Status   Specimen Description BLOOD LEFT HAND  Final   Special Requests BOTTLES DRAWN AEROBIC ONLY 4CCS  Final   Culture NO GROWTH 2 DAYS  Final   Report Status PENDING  Incomplete  Surgical pcr screen     Status: None   Collection Time: 05/26/15  2:35 AM  Result Value Ref Range Status   MRSA, PCR NEGATIVE NEGATIVE Final   Staphylococcus aureus NEGATIVE NEGATIVE Final    Comment:        The Xpert SA Assay (FDA approved for NASAL specimens in patients over 65 years of age), is one component of a comprehensive surveillance program.  Test performance has been validated by [facility] for patients greater than or equal to 42 year old. It is not intended to diagnose infection nor to guide or monitor treatment.       Studies/Results: No results found.  Medications:  Scheduled: . docusate sodium  100 mg Oral Daily  . donepezil  5 mg Oral QHS  . finasteride  5 mg Oral Daily  . haloperidol lactate  2.5 mg Intravenous Once  . Mesalamine  800 mg Oral TID  . mirtazapine  7.5 mg Oral QHS  . NIFEdipine  30 mg Oral Daily  . pantoprazole  40 mg Oral Daily  . piperacillin-tazobactam (ZOSYN)  IV  3.375 g Intravenous 3 times per day  . potassium chloride  10 mEq Intravenous Q1 Hr x 4  . potassium chloride  40 mEq Oral Once  . predniSONE  20 mg Oral Q breakfast  . sodium chloride  3 mL Intravenous Q12H  . vancomycin  1,000 mg Intravenous Q24H   Continuous: .  sodium chloride     BJY:[id number] **OR** acetaminophen (TYLENOL) oral liquid 160 mg/5 mL, alum & mag hydroxide-simeth, guaiFENesin-dextromethorphan, haloperidol lactate, hydrALAZINE, HYDROmorphone (DILAUDID) injection, magnesium sulfate 1 - 4 g bolus IVPB, morphine injection, ondansetron **OR** ondansetron (ZOFRAN) IV, oxyCODONE **OR** oxyCODONE, oxyCODONE-acetaminophen, phenol  Assessment/Plan:  Principal Problem:   Ischemic foot Active Problems:   HTN (hypertension)   Dementia   Protein-calorie malnutrition, severe (HCC)   Alzheimer's disease   Lower GI bleed   DVT of right axillary vein, acute    Right lower extremity ischemia Status post Right AKA. Vascular surgery is following. According to his niece, his foot has been cold to touch at least since Monday. Due  to patient's dementia history has been difficult to obtain.  Long discussion with patient's power of attorney, Ms. Billy [name] at [phone] who is patient's POA on 11/3. She had multiple concerns about patient's care. It doesn't appear that medical staff made aware of any problem with right foot before 11/2. Apparently the right foot has been cold since 10/28 per family.   DVT in the right axillary vein This was detected on venous Doppler study. Intravenous heparin was initiated and was held for surgery. Discussed with Dr. early today. Okay to reinitiate this afternoon. Due to recent GI bleed and surgery, we need to be cautious with this approach. Monitor closely for bleeding. Monitor hemoglobin.  Fever with leukocytosis Source could have been the right foot. He is now status post amputation. Blood cultures negative so far. If they remain negative for the next 24 hours and if he remains afebrile. Anticipate discontinuing antibiotics tomorrow. WBC is noted to be higher today. Fever appears to have subsided.   Rectal bleeding Patient was seen by gastroenterology. He underwent colonoscopy which shows mild-to-moderate  colitis. Etiology shows severely active chronic colitis without any evidence for dysplasia. Inflammatory bowel disease was thought to be likely. Patient was started on mesalamine. Oral prednisone also appears to have been recommended by gastroenterology. According to nursing reports from 11/2, minimal amount of bleeding was noted with bowel movement. This was most likely old blood. Hemoglobin has been stable. No further recurrence.  Severe Hypokalemia Will be aggressively repleted.   History of essential hypertension Continue nifedipine. Blood pressure noted to be significantly elevated this morning. Hydralazine as needed.  Loss of consciousness 10/28 This occurred in the hospital while he was having a bowel movement. Could have been vasovagal. Has been stable since then. Workup has been negative so far. Echocardiogram is pending.  Diverticulosis seen on colonoscopy Avoid constipation.   History of dementia Continue home medications. Appears to be moderate to advanced based on examination.   DVT Prophylaxis: SCDs    Code Status: Full code  Family Communication: Discussed with his niece and POA Billy [name] at [phone] (H). Cell [phone]. Disposition Plan: Continue management as above. Discharge to SNF when stable.     LOS: 5 days   [facility] HospitalKRISHNAN,Colyn Miron  [facility] Pager [phone] 05/27/2015, 10:39 AM  If 7PM-7AM, please contact night-coverage at [email or web address], password [email or web address]

## 2015-05-27 NOTE — Progress Notes (Signed)
ACE/compression bandage on AKA soiled with bowel this morning. This RN ordered replacement wrap and rewrapped only compression bandage. On-coming RN aware of situation and dressing change. Kerlex/gauze under compression bandage not changed. Will continue to monitor.

## 2015-05-27 NOTE — Progress Notes (Addendum)
[facility] [place]  Subjective  - Follows some directions.   Objective 185/81 98 98.6 F (37 C) (Oral) 20 100%  Intake/Output Summary (Last 24 hours) at 05/27/15 0843 Last data filed at 05/27/15 16100633  Gross per 24 hour  Intake   1000 ml  Output    275 ml  Net    725 ml    Right AKA dressing was changed this am due to bm. Incision clean and dry without active drainage or bleeding   Assessment/Planning: POD # 1 AKA right  Daily dressing changes will check incision tomorrow   [name], [name] [facility]EN 05/27/2015 8:43 AM --  Laboratory Lab Results:  Recent Labs  05/26/15 0518 05/27/15 0526  WBC 11.1* 14.3*  HGB 11.0* 9.7*  HCT 33.7* 30.2*  PLT 169 501*   BMET  Recent Labs  05/26/15 0750 05/27/15 0526  NA 148* 145  K 3.0* 2.8*  CL 110 111  CO2 21* 20*  GLUCOSE 95 75  BUN 17 15  CREATININE 1.11 1.14  CALCIUM 8.7* 8.1*    COAG Lab Results  Component Value Date   INR 1.5 02/26/2008   INR 1.6* 02/25/2008   INR 1.6* 02/24/2008   No results found for: PTT    I have examined the patient, reviewed and agree with above. Denies pain and amputation site. Family here helping feed him this morning.  Gretta BeganEarly, Kalyssa Anker, MD 05/27/2015 9:31 AM

## 2015-05-27 NOTE — Progress Notes (Signed)
ANTICOAGULATION CONSULT NOTE - Follow Up Consult  Pharmacy Consult for Heparin Indication: DVT  No Known Allergies  Patient Measurements: Height: 5\' 9"  (175.3 cm) Weight: 111 lb 15.9 oz (50.8 kg) (per NA Courtney) IBW/kg (Calculated) : 70.7 Heparin Dosing Weight: 54.8 kg  Vital Signs: Temp: 98.3 F (36.8 C) (11/05 2152) Temp Source: Oral (11/05 2152) BP: 127/70 mmHg (11/05 2152) Pulse Rate: 95 (11/05 2152)  Labs:  Recent Labs  05/25/15 1839  05/26/15 0518 05/26/15 0750 05/27/15 0526 05/27/15 1800 05/27/15 2229  HGB 11.5*  --  11.0*  --  9.7*  --   --   HCT 35.4*  --  33.7*  --  30.2*  --   --   PLT 422*  --  169  --  501*  --   --   HEPARINUNFRC  --   < > <0.10*  --  <0.10*  --  0.25*  CREATININE  --   --   --  1.11 1.14 1.03  --   < > = values in this interval not displayed.  Estimated Creatinine Clearance: 40.4 mL/min (by C-G formula based on Cr of 1.03).   Medications:  Infusions:  . sodium chloride    . heparin 1,250 Units/hr (05/27/15 1512)    Assessment: 81 year old male with RUE venous duplex revealed acute, non occlusive DVT noted in the right axillary vein beginning at the origin in the axilla, extending into the chest. S/p R- AKA 11/4. Restarted heparin at 1250 units/hr at 11/5 @ 2PM w/o bolus. HL is sub-therapeutic at 0.25. RN reports no s/s of bleeding    Goal of Therapy:  Heparin level 0.3-0.7 units/ml Monitor platelets by anticoagulation protocol: Yes   Plan:  -Increase IV heparin to 1350 units/hr with no bolus -f/u 8 hr heparin level -Daily heparin level and CBC   Vinnie LevelBenjamin Mysti Haley, PharmD., BCPS Clinical Pharmacist

## 2015-05-27 NOTE — Progress Notes (Signed)
MD Rito EhrlichKrishnan paged, patient has low potassium.

## 2015-05-28 ENCOUNTER — Inpatient Hospital Stay (HOSPITAL_COMMUNITY): Payer: Medicare Other

## 2015-05-28 DIAGNOSIS — R55 Syncope and collapse: Secondary | ICD-10-CM

## 2015-05-28 LAB — BASIC METABOLIC PANEL
Anion gap: 10 (ref 5–15)
BUN: 14 mg/dL (ref 6–20)
CHLORIDE: 113 mmol/L — AB (ref 101–111)
CO2: 22 mmol/L (ref 22–32)
CREATININE: 1.06 mg/dL (ref 0.61–1.24)
Calcium: 8 mg/dL — ABNORMAL LOW (ref 8.9–10.3)
GFR calc Af Amer: >60 mL/min (ref >60)
GFR calc non Af Amer: >60 mL/min (ref >60)
Glucose, Bld: 105 mg/dL — ABNORMAL HIGH (ref 65–99)
Potassium: 3.8 mmol/L (ref 3.5–5.1)
Sodium: 145 mmol/L (ref 135–145)

## 2015-05-28 LAB — HEPARIN LEVEL (UNFRACTIONATED)
Heparin Unfractionated: 0.61 IU/mL (ref 0.30–0.70)
Heparin Unfractionated: 0.36 IU/mL (ref 0.30–0.70)

## 2015-05-28 LAB — CBC
HEMATOCRIT: 28.8 % — AB (ref 39.0–52.0)
HEMOGLOBIN: 9.1 g/dL — AB (ref 13.0–17.0)
MCH: 27.9 pg (ref 26.0–34.0)
MCHC: 31.6 g/dL (ref 30.0–36.0)
MCV: 88.3 fL (ref 78.0–100.0)
Platelets: 541 10*3/uL — ABNORMAL HIGH (ref 150–400)
RBC: 3.26 MIL/uL — ABNORMAL LOW (ref 4.22–5.81)
RDW: 15 % (ref 11.5–15.5)
WBC: 15.4 10*3/uL — ABNORMAL HIGH (ref 4.0–10.5)

## 2015-05-28 LAB — MAGNESIUM: Magnesium: 2.5 mg/dL — ABNORMAL HIGH (ref 1.7–2.4)

## 2015-05-28 MED ORDER — DOXYCYCLINE HYCLATE 100 MG PO TABS
100.0000 mg | ORAL_TABLET | Freq: Two times a day (BID) | ORAL | Status: DC
  Administered 2015-05-28 – 2015-05-30 (×5): 100 mg via ORAL
  Filled 2015-05-28 (×5): qty 1

## 2015-05-28 MED ORDER — METOPROLOL TARTRATE 12.5 MG HALF TABLET
12.5000 mg | ORAL_TABLET | Freq: Two times a day (BID) | ORAL | Status: DC
  Administered 2015-05-29 (×2): 12.5 mg via ORAL
  Filled 2015-05-28 (×5): qty 1

## 2015-05-28 MED ORDER — POTASSIUM CHLORIDE CRYS ER 20 MEQ PO TBCR
40.0000 meq | EXTENDED_RELEASE_TABLET | Freq: Once | ORAL | Status: AC
  Administered 2015-05-28: 40 meq via ORAL
  Filled 2015-05-28: qty 2

## 2015-05-28 NOTE — Progress Notes (Signed)
[facility] PROGRESS NOTE  Jerald KiefSylvester Mathews JXB:[id number] DOB: 1931-08-23 DOA: 05/17/2015  PCP: Georgann HousekeeperHUSAIN,KARRAR, MD  Brief HPI: 81 year old African-American male with a past medical history of dementia, diverticulosis, hypertension, presented with rectal bleeding. Underwent colonoscopy which revealed colitis. This was suggestive of inflammatory bowel disease. Subsequently noted to have right lower extremity ischemia. Also found to have a DVT in the right upper extremity. He underwent right above-knee amputation.  Past medical history:  Past Medical History  Diagnosis Date  . Hypertension   . Hyperlipidemia   . Alzheimer's disease   . HTN (hypertension) 11/21/2014  . Dementia 11/21/2014  . BPH (benign prostatic hyperplasia) 11/21/2014    [facility], [facility]  Procedures:  Colonoscopy 10/28 IMPRESSIONS:  1.mild to moderate colitis throughout status post biopsy seemingly sparing the cecum and proximal ascending and most distal rectum  2. primarily sigmoid small diverticuli with few scattered throughout the remainder of the colon  3. transverse and descending small polyp status post cold biopsy  4. normal terminal ileum  5. otherwise within normal limits as above with cecum and parts of ascending transverse and sigmoid not well seen  EEG "Interpretation: This EEG is abnormal with moderately severe generalized continuous nonspecific slowing of cerebral activity consistent with moderately severe encephalopathic state. Significance of triphasic sharp waves, which are most often associated with metabolic encephalopathy, is unclear. No frank seizure discharges were recorded."  Right AKA 11/4  Antibiotics: Vancomycin and Zosyn 11/3--11/6  Subjective: Patient unable to communicate effectively due to his dementia. No bleeding reported by RN. Patient denies any pain.  Objective: Vital Signs  Filed Vitals:   05/27/15 1055 05/27/15 1300  05/27/15 2152 05/28/15 0637  BP:  125/76 127/70 144/84  Pulse:   95 97  Temp:   98.3 F (36.8 C) 98.4 F (36.9 C)  TempSrc:   Oral Oral  Resp:   16 14  Height:      Weight: 50.8 kg (111 lb 15.9 oz)     SpO2:   99% 100%    Intake/Output Summary (Last 24 hours) at 05/28/15 0949 Last data filed at 05/28/15 0924  Gross per 24 hour  Intake    420 ml  Output    650 ml  Net   -230 ml   Filed Weights   05/17/15 2127 05/19/15 1302 05/27/15 1055  Weight: 54.9 kg (121 lb 0.5 oz) 54.885 kg (121 lb) 50.8 kg (111 lb 15.9 oz)    General appearance: alert, distracted and no distress Resp: clear to auscultation bilaterally Cardio: regular rate and rhythm, S1, S2 normal, no murmur, click, rub or gallop GI: soft, non-tender; bowel sounds normal; no masses,  no organomegaly Extremities: Right upper extremity is contracted. No obvious swelling or deformity noted. He is now status post right AKA Neurologic: Disoriented. No other focal deficits. Contracted right upper extremity. It is chronic per his neice.  Lab Results:  Basic Metabolic Panel:  Recent Labs Lab 05/25/15 0620 05/26/15 0750 05/27/15 0526 05/27/15 1800 05/28/15 0311  NA 147* 148* 145 146* 145  K 3.4* 3.0* 2.8* 4.5 3.8  CL 113* 110 111 112* 113*  CO2 23 21* 20* 19* 22  GLUCOSE 99 95 75 113* 105*  BUN 13 17 15 11 14   CREATININE 0.78 1.11 1.14 1.03 1.06  CALCIUM 8.4* 8.7* 8.1* 8.4* 8.0*   CBC:  Recent Labs Lab 05/24/15 0535 05/25/15 0620 05/25/15 1839 05/26/15 0518 05/27/15 0526 05/28/15 0311  WBC 16.8* 15.9* 17.5* 11.1* 14.3* 15.4*  NEUTROABS 12.4*  --   --   --   --   --   HGB 11.9* 11.2* 11.5* 11.0* 9.7* 9.1*  HCT 36.2* 34.6* 35.4* 33.7* 30.2* 28.8*  MCV 87.9 88.0 86.6 84.9 87.0 88.3  PLT 416* 467* 422* 169 501* 541*    CBG: No results for input(s): GLUCAP in the last 168 hours.  Recent Results (from the past 240 hour(s))  Culture, blood (routine x 2)     Status: None (Preliminary result)   Collection  Time: 05/25/15  6:14 AM  Result Value Ref Range Status   Specimen Description BLOOD LEFT ANTECUBITAL  Final   Special Requests BOTTLES DRAWN AEROBIC ONLY 10CCS  Final   Culture NO GROWTH 2 DAYS  Final   Report Status PENDING  Incomplete  Culture, blood (routine x 2)     Status: None (Preliminary result)   Collection Time: 05/25/15  6:20 AM  Result Value Ref Range Status   Specimen Description BLOOD LEFT HAND  Final   Special Requests BOTTLES DRAWN AEROBIC ONLY 4CCS  Final   Culture NO GROWTH 2 DAYS  Final   Report Status PENDING  Incomplete  Surgical pcr screen     Status: None   Collection Time: 05/26/15  2:35 AM  Result Value Ref Range Status   MRSA, PCR NEGATIVE NEGATIVE Final   Staphylococcus aureus NEGATIVE NEGATIVE Final    Comment:        The Xpert SA Assay (FDA approved for NASAL specimens in patients over 30 years of age), is one component of a comprehensive surveillance program.  Test performance has been validated by [facility] for patients greater than or equal to 5 year old. It is not intended to diagnose infection nor to guide or monitor treatment.       Studies/Results: No results found.  Medications:  Scheduled: . docusate sodium  100 mg Oral Daily  . donepezil  5 mg Oral QHS  . doxycycline  100 mg Oral Q12H  . finasteride  5 mg Oral Daily  . haloperidol lactate  2.5 mg Intravenous Once  . Mesalamine  800 mg Oral TID  . mirtazapine  7.5 mg Oral QHS  . NIFEdipine  30 mg Oral Daily  . pantoprazole  40 mg Oral Daily  . potassium chloride  40 mEq Oral Once  . predniSONE  20 mg Oral Q breakfast  . sodium chloride  3 mL Intravenous Q12H   Continuous: . sodium chloride    . heparin 1,350 Units/hr (05/28/15 0805)   YQI:[id number] **OR** acetaminophen (TYLENOL) oral liquid 160 mg/5 mL, alum & mag hydroxide-simeth, guaiFENesin-dextromethorphan, haloperidol lactate, hydrALAZINE, HYDROmorphone (DILAUDID) injection, magnesium sulfate 1 - 4 g bolus IVPB,  morphine injection, ondansetron **OR** ondansetron (ZOFRAN) IV, oxyCODONE-acetaminophen, phenol  Assessment/Plan:  Principal Problem:   Ischemic foot Active Problems:   HTN (hypertension)   Dementia   Protein-calorie malnutrition, severe (HCC)   Alzheimer's disease   Lower GI bleed   DVT of right axillary vein, acute    Right lower extremity ischemia Status post Right AKA. Vascular surgery is following. Due to patient's dementia history has been difficult to obtain.  Long discussion with patient's power of attorney, Ms. Guinevere Ferrari at [phone] who is patient's POA on 11/3. She had multiple concerns about patient's care. It doesn't appear that medical staff made aware of any problem with right foot before 11/2. Apparently the right foot has been cold since 10/28 per family.   DVT in the right axillary  vein This was detected on venous Doppler study. Intravenous heparin was initiated and was held for surgery. Discussed with Dr. Arbie Cookey 11/5 , and was reinitiated. Due to recent GI bleed and surgery, we need to be cautious with this approach. Monitor closely for bleeding. Monitor hemoglobin. If hemoglobin remains stable and if he doesn't have any more bleeding. anticipate transition to oral anticoagulation tomorrow.  Fever with leukocytosis Source could have been the right foot. He is now status post amputation. Blood cultures negative so far. Okay to discontinue vancomycin and Zosyn. Start doxycycline. WBC is noted to be high, could be due to steroids. He remains afebrile.  Rectal bleeding Patient was seen by gastroenterology. He underwent colonoscopy which shows mild-to-moderate colitis. Etiology shows severely active chronic colitis without any evidence for dysplasia. Inflammatory bowel disease was thought to be likely. Patient was started on mesalamine. Oral prednisone also appears to have been recommended by gastroenterology. According to nursing reports from 11/2, minimal amount of  bleeding was noted with bowel movement. This was most likely old blood. Hemoglobin has been stable. No further recurrence.  Severe Hypokalemia Repleted.   History of essential hypertension Continue nifedipine. Blood pressure improved this morning. Hydralazine as needed.  Loss of consciousness 10/28 This occurred in the hospital while he was having a bowel movement. Could have been vasovagal. Has been stable since then. Workup has been negative so far. Echocardiogram is pending.  Diverticulosis seen on colonoscopy Avoid constipation.   History of dementia Continue home medications. Appears to be moderate to advanced based on examination.   DVT Prophylaxis: SCDs    Code Status: Full code  Family Communication: Discussed with his niece and POA Guinevere Ferrari at [phone] (H) 11/5. Cell [phone]. Disposition Plan: Continue management as above. Discharge to SNF when stable.     LOS: 6 days   [facility]  [facility] Pager [phone] 05/28/2015, 9:49 AM  If 7PM-7AM, please contact night-coverage at [email or web address], password [facility]

## 2015-05-28 NOTE — Progress Notes (Signed)
MD Rito EhrlichKrishnan paged, patient had a run of SVT, currently NSR, attempting to get vital signs and EKG as ordered, patient is being combative, refusing, and just had large BM.

## 2015-05-28 NOTE — Progress Notes (Addendum)
[facility] [place]  Subjective  - Patient alert and pleasant this am   Objective 144/84 97 98.4 F (36.9 C) (Oral) 14 100%  Intake/Output Summary (Last 24 hours) at 05/28/15 0905 Last data filed at 05/28/15 16100721  Gross per 24 hour  Intake    360 ml  Output    650 ml  Net   -290 ml    Right AKA site clean and dry dressing in place   Assessment/Planning: POD # 2 AKA right Daily dressing changes will check incision tomorrow   [name], Billy [name] [facility]MA[facility]EN 05/28/2015 9:05 AM --  Laboratory Lab Results:  Recent Labs  05/27/15 0526 05/28/15 0311  WBC 14.3* 15.4*  HGB 9.7* 9.1*  HCT 30.2* 28.8*  PLT 501* 541*   BMET  Recent Labs  05/27/15 1800 05/28/15 0311  NA 146* 145  K 4.5 3.8  CL 112* 113*  CO2 19* 22  GLUCOSE 113* 105*  BUN 11 14  CREATININE 1.03 1.06  CALCIUM 8.4* 8.0*    COAG Lab Results  Component Value Date   INR 1.5 02/26/2008   INR 1.6* 02/25/2008   INR 1.6* 02/24/2008   No results found for: PTT    I have reviewed and agree with above.  Gretta BeganEarly, Billy Bednarczyk, MD 05/28/2015 9:25 AM

## 2015-05-28 NOTE — Progress Notes (Signed)
ANTICOAGULATION CONSULT NOTE - Follow Up Consult  Pharmacy Consult for Heparin Indication: DVT  No Known Allergies  Patient Measurements: Height: 5\' 9"  (175.3 cm) Weight: 111 lb 15.9 oz (50.8 kg) (per NA Courtney) IBW/kg (Calculated) : 70.7  Vital Signs: Temp: 97.6 F (36.4 C) (11/06 1326) Temp Source: Axillary (11/06 1326) BP: 136/62 mmHg (11/06 1326) Pulse Rate: 92 (11/06 1326)  Labs:  Recent Labs  05/26/15 0518  05/27/15 0526 05/27/15 1800 05/27/15 2229 05/28/15 0311 05/28/15 0720 05/28/15 1723  HGB 11.0*  --  9.7*  --   --  9.1*  --   --   HCT 33.7*  --  30.2*  --   --  28.8*  --   --   PLT 169  --  501*  --   --  541*  --   --   HEPARINUNFRC <0.10*  --  <0.10*  --  0.25*  --  0.61 0.36  CREATININE  --   < > 1.14 1.03  --  1.06  --   --   < > = values in this interval not displayed.  Estimated Creatinine Clearance: 39.3 mL/min (by C-G formula based on Cr of 1.06).   Medications:  Scheduled:  . docusate sodium  100 mg Oral Daily  . donepezil  5 mg Oral QHS  . doxycycline  100 mg Oral Q12H  . finasteride  5 mg Oral Daily  . haloperidol lactate  2.5 mg Intravenous Once  . Mesalamine  800 mg Oral TID  . metoprolol tartrate  12.5 mg Oral BID  . mirtazapine  7.5 mg Oral QHS  . NIFEdipine  30 mg Oral Daily  . pantoprazole  40 mg Oral Daily  . predniSONE  20 mg Oral Q breakfast  . sodium chloride  3 mL Intravenous Q12H   Infusions:  . sodium chloride    . heparin 1,350 Units/hr (05/28/15 0805)   PRN: acetaminophen **OR** acetaminophen (TYLENOL) oral liquid 160 mg/5 mL, alum & mag hydroxide-simeth, guaiFENesin-dextromethorphan, haloperidol lactate, hydrALAZINE, HYDROmorphone (DILAUDID) injection, magnesium sulfate 1 - 4 g bolus IVPB, morphine injection, ondansetron **OR** ondansetron (ZOFRAN) IV, oxyCODONE-acetaminophen, phenol  Assessment: 81 year old male with RUE venous duplex revealed acute, non occlusive DVT noted in the right axillary vein beginning at  the origin in the axilla, extending into the chest. S/p R- AKA 11/4.  Heparin remains therapeutic on 1350 units/hr.    Goal of Therapy:  Heparin level 0.3-0.7 units/ml Monitor platelets by anticoagulation protocol: Yes   Plan:  Continue Heparin gtt at 1350 units/hr  Daily CBC and heparin level Monitor for s/sx of bleeding   Toys 'R' UsKimberly Kortnie Stovall, Pharm.D., BCPS Clinical Pharmacist Pager [phone] 05/28/2015 7:39 PM

## 2015-05-28 NOTE — Progress Notes (Signed)
  Echocardiogram 2D Echocardiogram has been performed.  Billy [name], Billy [name] 05/28/2015, 5:44 PM

## 2015-05-28 NOTE — Progress Notes (Signed)
MD Rito EhrlichKrishnan paged results of EKG and patient VS as requested.

## 2015-05-28 NOTE — Progress Notes (Signed)
ANTICOAGULATION CONSULT NOTE - Follow Up Consult  Pharmacy Consult for Heparin Indication: DVT  No Known Allergies  Patient Measurements: Height: 5\' 9"  (175.3 cm) Weight: 111 lb 15.9 oz (50.8 kg) (per NA Courtney) IBW/kg (Calculated) : 70.7  Vital Signs: Temp: 98.4 F (36.9 C) (11/06 0637) Temp Source: Oral (11/06 0637) BP: 144/84 mmHg (11/06 0637) Pulse Rate: 97 (11/06 0637)  Labs:  Recent Labs  05/26/15 0518  05/27/15 0526 05/27/15 1800 05/27/15 2229 05/28/15 0311 05/28/15 0720  HGB 11.0*  --  9.7*  --   --  9.1*  --   HCT 33.7*  --  30.2*  --   --  28.8*  --   PLT 169  --  501*  --   --  541*  --   HEPARINUNFRC <0.10*  --  <0.10*  --  0.25*  --  0.61  CREATININE  --   < > 1.14 1.03  --  1.06  --   < > = values in this interval not displayed.  Estimated Creatinine Clearance: 39.3 mL/min (by C-G formula based on Cr of 1.06).   Medications:  Scheduled:  . docusate sodium  100 mg Oral Daily  . donepezil  5 mg Oral QHS  . finasteride  5 mg Oral Daily  . haloperidol lactate  2.5 mg Intravenous Once  . Mesalamine  800 mg Oral TID  . mirtazapine  7.5 mg Oral QHS  . NIFEdipine  30 mg Oral Daily  . pantoprazole  40 mg Oral Daily  . piperacillin-tazobactam (ZOSYN)  IV  3.375 g Intravenous 3 times per day  . predniSONE  20 mg Oral Q breakfast  . sodium chloride  3 mL Intravenous Q12H  . vancomycin  750 mg Intravenous Q24H   Infusions:  . sodium chloride    . heparin 1,350 Units/hr (05/28/15 0805)   PRN: acetaminophen **OR** acetaminophen (TYLENOL) oral liquid 160 mg/5 mL, alum & mag hydroxide-simeth, guaiFENesin-dextromethorphan, haloperidol lactate, hydrALAZINE, HYDROmorphone (DILAUDID) injection, magnesium sulfate 1 - 4 g bolus IVPB, morphine injection, ondansetron **OR** ondansetron (ZOFRAN) IV, oxyCODONE-acetaminophen, phenol  Assessment: 81 year old male with RUE venous duplex revealed acute, non occlusive DVT noted in the right axillary vein beginning at the  origin in the axilla, extending into the chest. S/p R- AKA 11/4. HL currently therapeutic 0.61 with Heparin gtt 1350 units/hr.  Hgb 9.1, Plts 541 Has had some Bleeding per rectally previously on 11/2, colonoscopy showed mild-mod colitis.    No s/sx bleeding currently noted.   Goal of Therapy:  Heparin level 0.3-0.7 units/ml Monitor platelets by anticoagulation protocol: Yes   Plan:  Continue Heparin gtt at 1350 units/hr  8 hr HL to confirm (1730) Daily CBC/HL Monitor for s/sx of bleeding   Hazle NordmannKelsy Combs, PharmD Pharmacy Resident [phone] 05/28/2015,9:19 AM

## 2015-05-29 ENCOUNTER — Encounter (HOSPITAL_COMMUNITY): Payer: Self-pay | Admitting: Vascular Surgery

## 2015-05-29 LAB — CBC
HEMATOCRIT: 32.3 % — AB (ref 39.0–52.0)
HEMOGLOBIN: 10.2 g/dL — AB (ref 13.0–17.0)
MCH: 27.8 pg (ref 26.0–34.0)
MCHC: 31.6 g/dL (ref 30.0–36.0)
MCV: 88 fL (ref 78.0–100.0)
Platelets: 605 10*3/uL — ABNORMAL HIGH (ref 150–400)
RBC: 3.67 MIL/uL — AB (ref 4.22–5.81)
RDW: 15 % (ref 11.5–15.5)
WBC: 16.5 10*3/uL — AB (ref 4.0–10.5)

## 2015-05-29 LAB — BASIC METABOLIC PANEL
ANION GAP: 12 (ref 5–15)
BUN: 10 mg/dL (ref 6–20)
CHLORIDE: 111 mmol/L (ref 101–111)
CO2: 20 mmol/L — ABNORMAL LOW (ref 22–32)
Calcium: 8.4 mg/dL — ABNORMAL LOW (ref 8.9–10.3)
Creatinine, Ser: 0.88 mg/dL (ref 0.61–1.24)
GLUCOSE: 87 mg/dL (ref 65–99)
POTASSIUM: 3.6 mmol/L (ref 3.5–5.1)
SODIUM: 143 mmol/L (ref 135–145)

## 2015-05-29 LAB — HEPARIN LEVEL (UNFRACTIONATED): Heparin Unfractionated: 0.95 IU/mL — ABNORMAL HIGH (ref 0.30–0.70)

## 2015-05-29 MED ORDER — SODIUM CHLORIDE 0.9 % IV SOLN
INTRAVENOUS | Status: AC
  Administered 2015-05-29 – 2015-05-30 (×2): via INTRAVENOUS

## 2015-05-29 MED ORDER — SODIUM CHLORIDE 0.9 % IV BOLUS (SEPSIS)
250.0000 mL | Freq: Once | INTRAVENOUS | Status: AC
  Administered 2015-05-29: 250 mL via INTRAVENOUS

## 2015-05-29 MED ORDER — RIVAROXABAN 15 MG PO TABS
15.0000 mg | ORAL_TABLET | Freq: Two times a day (BID) | ORAL | Status: DC
  Administered 2015-05-29 – 2015-05-30 (×3): 15 mg via ORAL
  Filled 2015-05-29 (×3): qty 1

## 2015-05-29 NOTE — Progress Notes (Addendum)
ANTICOAGULATION CONSULT NOTE - Follow Up Consult  Pharmacy Consult for Heparin ->> Xarelto Indication: DVT  No Known Allergies  Patient Measurements: Height: 5\' 9"  (175.3 cm) Weight: 111 lb 15.9 oz (50.8 kg) (per NA Courtney) IBW/kg (Calculated) : 70.7  Vital Signs: Temp: 98.2 F (36.8 C) (11/06 2232) Temp Source: Oral (11/06 2232) BP: 169/90 mmHg (11/07 0104) Pulse Rate: 90 (11/07 0104)  Labs:  Recent Labs  05/27/15 0526 05/27/15 1800  05/28/15 0311 05/28/15 0720 05/28/15 1723 05/29/15 0559  HGB 9.7*  --   --  9.1*  --   --  10.2*  HCT 30.2*  --   --  28.8*  --   --  32.3*  PLT 501*  --   --  541*  --   --  605*  HEPARINUNFRC <0.10*  --   < >  --  0.61 0.36 0.95*  CREATININE 1.14 1.03  --  1.06  --   --   --   < > = values in this interval not displayed.  Estimated Creatinine Clearance: 39.3 mL/min (by C-G formula based on Cr of 1.06).  Assessment: 81 year old male with RUE venous duplex revealed acute, non occlusive DVT noted in the right axillary vein beginning at the origin in the axilla, extending into the chest. S/p R- AKA 11/4. Pharmacy consulted to transition patient from IV heparin to Xarelto. No bleeding noted, CBC is stable. Renal function is normal.  Goal of Therapy:  Therapeutic anticoagulation Monitor platelets by anticoagulation protocol: Yes   Plan:  Stop heparin drip at the time of Xarelto administration Xarelto 15 mg PO bid with meals for 21 days then 20 mg daily with supper Spoke with RN about plan  Fluor CorporationJennifer DeWitt, Pharm.D., BCPS Clinical Pharmacist Pager: [phone] 05/29/2015 10:49 AM

## 2015-05-29 NOTE — Progress Notes (Signed)
Thank you for consult on Mr. Billy [name]. Chart reviewed and note that he underwent R-AKA on 05/26/15 due to ischemic right foot.  PT/OT notes recommended SNF prior to surgery due to pain as well as cognition limiting activity tolerance. Doubt that he will be able to tolerate CIR level therapies and note that LCS[facility] working [facility]. Will defer CIR consult for now. Please reconsult if patient shows improvement in activity tolerance

## 2015-05-29 NOTE — Progress Notes (Signed)
   VASCULAR SURGERY ASSESSMENT & PLAN:  * 3 Days Post-Op s/p: Right AKA  *  Healing nicely  * Poor po intake it looks like  SUBJECTIVE: Pain well controlled.  PHYSICAL EXAM: Filed Vitals:   05/28/15 2232 05/29/15 0104 05/29/15 0704 05/29/15 0912  BP: 184/94 169/90 178/93 134/80  Pulse: 109 90  108  Temp: 98.2 F (36.8 C)     TempSrc: Oral     Resp: 16     Height:      Weight:      SpO2: 100%      Right AKA inspected and looks fine.   LABS: Lab Results  Component Value Date   WBC 16.5* 05/29/2015   HGB 10.2* 05/29/2015   HCT 32.3* 05/29/2015   MCV 88.0 05/29/2015   PLT 605* 05/29/2015   Lab Results  Component Value Date   CREATININE 0.88 05/29/2015   Lab Results  Component Value Date   INR 1.5 02/26/2008   Principal Problem:   Ischemic foot Active Problems:   HTN (hypertension)   Dementia   Protein-calorie malnutrition, severe (HCC)   Alzheimer's disease   Lower GI bleed   DVT of right axillary vein, acute   Billy CarawayChris Katheen [name] Beeper: [phone] 05/29/2015

## 2015-05-29 NOTE — Progress Notes (Signed)
Confirmed with Carney BernJean that [facility] was [facility].  CSW started [employer] auth for anticipated DC tomorrow- auth pending  CSW will continue to follow  Merlyn LotJenna Holoman, Huggins HospitalCSWA Clinical Social Worker [phone]

## 2015-05-29 NOTE — Progress Notes (Signed)
Physical Therapy Re-evaluation/Treatment Patient Details Name: Billy [name] MRN: [id number] DOB: June 21, 1932 Today's Date: 05/29/2015    History of Present Illness Billy [name] is a 81 y.o. male with h/o Alheimer's dementia presenting with rectal bleeding for the last 3 weeks worsening in the last 2 days. On 11/3 pt found to have RUE DVT and ischemic RLE. Underwent AKA on rt on 11/4.    PT Comments    Pt re-evaluated after rt AKA. Pt will continue to require SNF. Goals updated due to new AKA.  Follow Up Recommendations  SNF     Equipment Recommendations  Wheelchair (measurements PT)    Recommendations for Other Services       Precautions / Restrictions Precautions Precautions: Fall    Mobility  Bed Mobility Overal bed mobility: Needs Assistance Bed Mobility: Supine to Sit;Sit to Supine     Supine to sit: Mod assist;+2 for physical assistance Sit to supine: Mod assist;+2 for physical assistance   General bed mobility comments: Assist to bring leg off bed and elevate trunk. Assist to lower trunk and to bring leg back up into bed  Transfers                    Ambulation/Gait                 Stairs            Wheelchair Mobility    Modified Rankin (Stroke Patients Only)       Balance   Sitting-balance support: No upper extremity supported;Single extremity supported Sitting balance-Leahy Scale: Poor Sitting balance - Comments: Able to sit unsupported ~80% of time but using UE support. Occasional min A due to posterior lean Postural control: Posterior lean                          Cognition Arousal/Alertness: Awake/alert Behavior During Therapy: WFL for tasks assessed/performed Overall Cognitive Status: History of cognitive impairments - at baseline Area of Impairment: Orientation;Following commands;Awareness Orientation Level: Disoriented to;Place;Time;Situation   Memory: Decreased recall of  precautions;Decreased short-term memory Following Commands: Follows one step commands inconsistently       General Comments: Don't think pt realizes he has had AKA    Exercises General Exercises - Lower Extremity Long Arc Quad:  (Pt moans w/ attempted PROM Rt LE)    General Comments        Pertinent Vitals/Pain Pain Assessment: Faces Faces Pain Scale: Hurts even more Pain Location: rt residual leg Pain Descriptors / Indicators: Guarding;Grimacing Pain Intervention(s): Limited activity within patient's tolerance;Monitored during session;Repositioned    Home Living                      Prior Function            PT Goals (current goals can now be found in the care plan section) Acute Rehab PT Goals PT Goal Formulation: Patient unable to participate in goal setting Time For Goal Achievement: 06/16/15 Potential to Achieve Goals: Fair Progress towards PT goals: Goals downgraded-see care plan    Frequency  Min 2X/week    PT Plan Current plan remains appropriate;Frequency needs to be updated    Co-evaluation             End of Session Equipment Utilized During Treatment: Gait belt Activity Tolerance: Patient limited by fatigue Patient left: in bed;with call bell/phone within reach;with bed alarm set     Time: [id number] PT  Time Calculation (min) (ACUTE ONLY): 12 min  Charges:                       G Codes:      Billy [name] 2015/06/18, 3:37 PM [facility] PT [phone]

## 2015-05-29 NOTE — Progress Notes (Addendum)
[facility] PROGRESS NOTE  Cola Highfill UVO:[id number] DOB: 08-09-1931 DOA: 05/17/2015  PCP: [name], MD  Brief HPI: 81 year old African-American male with a past medical history of dementia, diverticulosis, hypertension, presented with rectal bleeding. Underwent colonoscopy which revealed colitis. This was suggestive of inflammatory bowel disease. Subsequently noted to have right lower extremity ischemia. Also found to have a DVT in the right upper extremity. He underwent right above-knee amputation.  Past medical history:  Past Medical History  Diagnosis Date  . Hypertension   . Hyperlipidemia   . Alzheimer's disease   . HTN (hypertension) 11/21/2014  . Dementia 11/21/2014  . BPH (benign prostatic hyperplasia) 11/21/2014    Consultants: [facility] gastroenterology, vascular surgery  Procedures:  Colonoscopy 10/28 IMPRESSIONS:  1.mild to moderate colitis throughout status post biopsy seemingly sparing the cecum and proximal ascending and most distal rectum  2. primarily sigmoid small diverticuli with few scattered throughout the remainder of the colon  3. transverse and descending small polyp status post cold biopsy  4. normal terminal ileum  5. otherwise within normal limits as above with cecum and parts of ascending transverse and sigmoid not well seen  EEG "Interpretation: This EEG is abnormal with moderately severe generalized continuous nonspecific slowing of cerebral activity consistent with moderately severe encephalopathic state. Significance of triphasic sharp waves, which are most often associated with metabolic encephalopathy, is unclear. No frank seizure discharges were recorded."  Right AKA 11/4  Echocardiogram Study Conclusions - Left ventricle: The cavity size was normal. Wall thickness wasnormal. Systolic function was mildly to moderately reduced. Theestimated ejection fraction was in the range of 40% to 45%.Doppler parameters are consistent  with abnormal left ventricularrelaxation (grade 1 diastolic dysfunction). - Mitral valve: There was mild regurgitation. - Right atrium: The atrium was mildly dilated. - Pulmonary arteries: PA peak pressure: 32 mm Hg (S). - Pericardium, extracardiac: A small pericardial effusion wasidentified.  Antibiotics: Vancomycin and Zosyn 11/3--11/6 Doxycycline 11/6  Subjective: Patient denies any pain this morning. According to the nurse he had large loose bowel moment. No blood noted.   Objective: Vital Signs  Filed Vitals:   05/28/15 2232 05/29/15 0104 05/29/15 0704 05/29/15 0912  BP: 184/94 169/90 178/93 134/80  Pulse: 109 90  108  Temp: 98.2 F (36.8 C)     TempSrc: Oral     Resp: 16     Height:      Weight:      SpO2: 100%       Intake/Output Summary (Last 24 hours) at 05/29/15 0955 Last data filed at 05/29/15 0700  Gross per 24 hour  Intake      0 ml  Output    900 ml  Net   -900 ml   Filed Weights   05/17/15 2127 05/19/15 1302 05/27/15 1055  Weight: 54.9 kg (121 lb 0.5 oz) 54.885 kg (121 lb) 50.8 kg (111 lb 15.9 oz)    General appearance: alert, distracted and no distress Resp: clear to auscultation bilaterally Cardio: regular rate and rhythm, S1, S2 normal, no murmur, click, rub or gallop GI: soft, non-tender; bowel sounds normal; no masses,  no organomegaly Extremities: Right upper extremity is contracted. No obvious swelling or deformity noted. He is now status post right AKA Neurologic: Disoriented. No other focal deficits. Contracted right upper extremity. It is chronic per his neice.  Lab Results:  Basic Metabolic Panel:  Recent Labs Lab 05/26/15 0750 05/27/15 0526 05/27/15 1800 05/28/15 0311 05/28/15 1401 05/29/15 0559  NA 148* 145 146* 145  --  143  K 3.0* 2.8* 4.5 3.8  --  3.6  CL 110 111 112* 113*  --  111  CO2 21* 20* 19* 22  --  20*  GLUCOSE 95 75 113* 105*  --  87  BUN 17 15 11 14   --  10  CREATININE 1.11 1.14 1.03 1.06  --  0.88  CALCIUM  8.7* 8.1* 8.4* 8.0*  --  8.4*  MG  --   --   --   --  2.5*  --    CBC:  Recent Labs Lab 05/24/15 0535  05/25/15 1839 05/26/15 0518 05/27/15 0526 05/28/15 0311 05/29/15 0559  WBC 16.8*  < > 17.5* 11.1* 14.3* 15.4* 16.5*  NEUTROABS 12.4*  --   --   --   --   --   --   HGB 11.9*  < > 11.5* 11.0* 9.7* 9.1* 10.2*  HCT 36.2*  < > 35.4* 33.7* 30.2* 28.8* 32.3*  MCV 87.9  < > 86.6 84.9 87.0 88.3 88.0  PLT 416*  < > 422* 169 501* 541* 605*  < > = values in this interval not displayed.  CBG: No results for input(s): GLUCAP in the last 168 hours.  Recent Results (from the past 240 hour(s))  Culture, blood (routine x 2)     Status: None (Preliminary result)   Collection Time: 05/25/15  6:14 AM  Result Value Ref Range Status   Specimen Description BLOOD LEFT ANTECUBITAL  Final   Special Requests BOTTLES DRAWN AEROBIC ONLY 10CCS  Final   Culture NO GROWTH 3 DAYS  Final   Report Status PENDING  Incomplete  Culture, blood (routine x 2)     Status: None (Preliminary result)   Collection Time: 05/25/15  6:20 AM  Result Value Ref Range Status   Specimen Description BLOOD LEFT HAND  Final   Special Requests BOTTLES DRAWN AEROBIC ONLY 4CCS  Final   Culture NO GROWTH 3 DAYS  Final   Report Status PENDING  Incomplete  Surgical pcr screen     Status: None   Collection Time: 05/26/15  2:35 AM  Result Value Ref Range Status   MRSA, PCR NEGATIVE NEGATIVE Final   Staphylococcus aureus NEGATIVE NEGATIVE Final    Comment:        The Xpert SA Assay (FDA approved for NASAL specimens in patients over 79 years of age), is one component of a comprehensive surveillance program.  Test performance has been validated by [facility] for patients greater than or equal to 79 year old. It is not intended to diagnose infection nor to guide or monitor treatment.       Studies/Results: No results found.  Medications:  Scheduled: . donepezil  5 mg Oral QHS  . doxycycline  100 mg Oral Q12H  .  finasteride  5 mg Oral Daily  . haloperidol lactate  2.5 mg Intravenous Once  . Mesalamine  800 mg Oral TID  . metoprolol tartrate  12.5 mg Oral BID  . mirtazapine  7.5 mg Oral QHS  . NIFEdipine  30 mg Oral Daily  . pantoprazole  40 mg Oral Daily  . predniSONE  20 mg Oral Q breakfast  . sodium chloride  250 mL Intravenous Once  . sodium chloride  3 mL Intravenous Q12H   Continuous: . sodium chloride    . sodium chloride    . heparin 1,250 Units/hr (05/29/15 0805)   UJW:[id number]:acetaminophen **OR** acetaminophen (TYLENOL) oral liquid 160 mg/5 mL, alum & mag hydroxide-simeth,  guaiFENesin-dextromethorphan, haloperidol lactate, hydrALAZINE, magnesium sulfate 1 - 4 g bolus IVPB, morphine injection, ondansetron **OR** ondansetron (ZOFRAN) IV, oxyCODONE-acetaminophen, phenol  Assessment/Plan:  Principal Problem:   Ischemic foot Active Problems:   HTN (hypertension)   Dementia   Protein-calorie malnutrition, severe (HCC)   Alzheimer's disease   Lower GI bleed   DVT of right axillary vein, acute    Right lower extremity ischemia Status post Right AKA. Vascular surgery is following. She'll remain stable postoperatively. Due to patient's dementia history has been difficult to obtain.  Long discussion with patient's power of attorney, Ms. Guinevere Ferrari at [phone] who is patient's POA on 11/3. She had multiple concerns about patient's care. It doesn't appear that medical staff made aware of any problem with right foot before 11/2. Apparently the right foot has been cold since 10/28 per family.   DVT in the right axillary vein This was detected on venous Doppler study. Intravenous heparin was initiated and was held for surgery. Discussed with Dr. Arbie Cookey 11/5 , and was reinitiated. Due to recent GI bleed and surgery, we were cautious with this approach. Patient has been stable without any evidence for bleeding. Hemoglobin is stable. We will transition to oral anticoagulation with Xarelto.   Fever  with leukocytosis Source could have been the right foot. He is now status post amputation. Blood cultures negative so far. Ankle mycin and Zosyn was discontinued 11/6. Patient was started on doxycycline. WBC is noted to be high, along with high platelet counts. This could be due to hemoconcentration. We will give him IV fluids. He is afebrile. WBC also twice a day to steroids.   Rectal bleeding Patient was seen by gastroenterology. He underwent colonoscopy which shows mild-to-moderate colitis. Etiology shows severely active chronic colitis without any evidence for dysplasia. Inflammatory bowel disease was thought to be likely. Patient was started on mesalamine. Oral prednisone also appears to have been recommended by gastroenterology. According to nursing reports from 11/2, minimal amount of bleeding was noted with bowel movement. This was most likely old blood. Hemoglobin has been stable. No further recurrence. Since he does have loose stools. We will stop his stool softener. No need to check for infectious etiology since he is thought to have an inflammatory bowel disease.  Severe Hypokalemia Repleted.   History of essential hypertension Continue nifedipine. Blood pressure improved this morning. Hydralazine as needed.  Loss of consciousness 10/28 This occurred in the hospital while he was having a bowel movement. Could have been vasovagal. Has been stable since then. Workup has been negative so far. Echocardiogram is as above. Borderline Low EF was noted with mild diastolic dysfunction. Considering his dementia he is not a candidate for further workup.  Brief SVT Noted on telemetry 11/6. Patient was asymptomatic. Magnesium level was somewhat low. He was started on a low-dose beta blocker. No further recurrence. Echocardiogram is as above.  Diverticulosis seen on colonoscopy Avoid constipation.   History of dementia Continue home medications. Appears to be moderate to advanced based on  examination.  DVT Prophylaxis: SCDs    Code Status: Full code  Family Communication: Discussed with his niece and POA Guinevere Ferrari at [phone] (H) 11/5. Cell [phone]. Discussed with his other niece, Ms. Yetta Barre, today. Disposition Plan: Continue management as above. Anticipate discharge to SNF tomorrow.     LOS: 7 days   [facility]  [facility] Pager [phone] 05/29/2015, 9:55 AM  If 7PM-7AM, please contact night-coverage at [email or web address], password [facility]

## 2015-05-30 LAB — CBC
HEMATOCRIT: 31.1 % — AB (ref 39.0–52.0)
Hemoglobin: 9.8 g/dL — ABNORMAL LOW (ref 13.0–17.0)
MCH: 27.7 pg (ref 26.0–34.0)
MCHC: 31.5 g/dL (ref 30.0–36.0)
MCV: 87.9 fL (ref 78.0–100.0)
PLATELETS: 629 10*3/uL — AB (ref 150–400)
RBC: 3.54 MIL/uL — AB (ref 4.22–5.81)
RDW: 14.9 % (ref 11.5–15.5)
WBC: 12.6 10*3/uL — ABNORMAL HIGH (ref 4.0–10.5)

## 2015-05-30 LAB — CULTURE, BLOOD (ROUTINE X 2)
Culture: NO GROWTH
Culture: NO GROWTH

## 2015-05-30 LAB — BASIC METABOLIC PANEL
ANION GAP: 10 (ref 5–15)
BUN: 7 mg/dL (ref 6–20)
CO2: 22 mmol/L (ref 22–32)
Calcium: 8.3 mg/dL — ABNORMAL LOW (ref 8.9–10.3)
Chloride: 112 mmol/L — ABNORMAL HIGH (ref 101–111)
Creatinine, Ser: 0.84 mg/dL (ref 0.61–1.24)
GFR calc Af Amer: >60 mL/min (ref >60)
GLUCOSE: 82 mg/dL (ref 65–99)
POTASSIUM: 3.3 mmol/L — AB (ref 3.5–5.1)
Sodium: 144 mmol/L (ref 135–145)

## 2015-05-30 MED ORDER — RIVAROXABAN 20 MG PO TABS: 20.0000 mg | ORAL_TABLET | Freq: Every day | ORAL | Status: DC

## 2015-05-30 MED ORDER — DOXYCYCLINE HYCLATE 100 MG PO TABS: 100.0000 mg | ORAL_TABLET | Freq: Two times a day (BID) | ORAL | Status: DC

## 2015-05-30 MED ORDER — METOPROLOL TARTRATE 25 MG PO TABS: 25.0000 mg | ORAL_TABLET | Freq: Two times a day (BID) | ORAL | Status: AC

## 2015-05-30 MED ORDER — POTASSIUM CHLORIDE CRYS ER 20 MEQ PO TBCR
40.0000 meq | EXTENDED_RELEASE_TABLET | Freq: Once | ORAL | Status: AC
  Administered 2015-05-30: 40 meq via ORAL
  Filled 2015-05-30: qty 2

## 2015-05-30 MED ORDER — OXYCODONE-ACETAMINOPHEN 5-325 MG PO TABS: 1.0000 | ORAL_TABLET | ORAL | Status: DC | PRN

## 2015-05-30 MED ORDER — METOPROLOL TARTRATE 25 MG PO TABS
25.0000 mg | ORAL_TABLET | Freq: Two times a day (BID) | ORAL | Status: DC
  Administered 2015-05-30: 25 mg via ORAL
  Filled 2015-05-30: qty 1

## 2015-05-30 MED ORDER — ENSURE NUTRITION SHAKE PO LIQD: 1.0000 | Freq: Three times a day (TID) | ORAL | Status: AC

## 2015-05-30 MED ORDER — POTASSIUM CHLORIDE CRYS ER 20 MEQ PO TBCR: 20.0000 meq | EXTENDED_RELEASE_TABLET | Freq: Every day | ORAL | Status: DC

## 2015-05-30 MED ORDER — RIVAROXABAN (XARELTO) VTE STARTER PACK (15 & 20 MG): ORAL_TABLET | ORAL | Status: DC

## 2015-05-30 MED ORDER — ACETAMINOPHEN 325 MG PO TABS: 325.0000 mg | ORAL_TABLET | ORAL | Status: AC | PRN

## 2015-05-30 MED ORDER — POTASSIUM CHLORIDE CRYS ER 20 MEQ PO TBCR: 20.0000 meq | EXTENDED_RELEASE_TABLET | Freq: Every day | ORAL | Status: AC

## 2015-05-30 NOTE — Progress Notes (Signed)
Orthopedic Tech Progress Note Patient Details:  Billy KiefSylvester [name] 07-27-1931 [id number]  Patient ID: Billy KiefSylvester [name], male   DOB: 07-27-1931, 81 y.o.   MRN: [id number] Called in bio-tech brace order; spoke with Anderson MaltaStephanie  [name] 05/30/2015, 10:11 AM

## 2015-05-30 NOTE — Evaluation (Signed)
Occupational Therapy  Re-Evaluation Patient Details Name: Billy [name] MRN: [id number] DOB: 04-23-1932 Today's Date: 05/30/2015    History of Present Illness Billy [name] is a 81 y.o. male with h/o Alheimer's dementia presenting with rectal bleeding for the last 3 weeks worsening in the last 2 days. On 11/3 pt found to have RUE DVT and ischemic RLE. Underwent AKA on rt on 11/4.   Clinical Impression   Pt is dependent in all ADL and mobility. Tolerated washing hands, oral care and washing face with hand over hand assist.  Plan is for SNF. Will defer OT to SNF.    Follow Up Recommendations  SNF    Equipment Recommendations       Recommendations for Other Services       Precautions / Restrictions Precautions Precautions: Fall Restrictions Weight Bearing Restrictions: Yes      Mobility Bed Mobility Overal bed mobility: Needs Assistance Bed Mobility: Supine to Sit;Sit to Supine     Supine to sit: Max assist Sit to supine: Max assist      Transfers                      Balance Overall balance assessment: Needs assistance   Sitting balance-Leahy Scale: Poor                                      ADL Overall ADL's : Needs assistance/impaired                                       General ADL Comments: Pt currently total assist for all ADL.  Hand over hand assist to wash face, hands and to perform oral care.     Vision Vision Assessment?: No apparent visual deficits   Perception     Praxis      Pertinent Vitals/Pain Pain Assessment: Faces Faces Pain Scale: Hurts little more Pain Location: generalized     Hand Dominance Right   Extremity/Trunk Assessment Upper Extremity Assessment Upper Extremity Assessment: Generalized weakness RUE Coordination: decreased fine motor;decreased gross motor           Communication Communication Communication: Expressive difficulties   Cognition  Arousal/Alertness: Awake/alert Behavior During Therapy: WFL for tasks assessed/performed Overall Cognitive Status: Impaired/Different from baseline Area of Impairment: Orientation;Attention;Memory;Following commands;Safety/judgement;Problem solving Orientation Level: Disoriented to;Place;Time;Situation Current Attention Level: Focused Memory: Decreased recall of precautions;Decreased short-term memory Following Commands: Follows one step commands inconsistently Safety/Judgement: Decreased awareness of safety;Decreased awareness of deficits   Problem Solving: Slow processing;Decreased initiation;Difficulty sequencing;Requires verbal cues;Requires tactile cues     General Comments       Exercises       Shoulder Instructions      Home Living Family/patient expects to be discharged to:: Skilled nursing facility                                        Prior Functioning/Environment               OT Diagnosis: Generalized weakness;Cognitive deficits;Acute pain   OT Problem List:     OT Treatment/Interventions:      OT Goals(Current goals can be found in the care plan section) Acute Rehab OT Goals Patient Stated Goal: none  stated  OT Frequency:     Barriers to D/C:            Co-evaluation              End of Session    Activity Tolerance: Patient limited by pain Patient left: in bed;with call bell/phone within reach;with bed alarm set   Time: 1135-1200 OT Time Calculation (min): 25 min Charges:  OT General Charges $OT Visit: 1 Procedure OT Evaluation $OT Re-eval: 1 Procedure OT Treatments $Self Care/Home Management : 8-22 mins G-Codes:    Evern Bio 05/30/2015, 12:13 PM  [phone]

## 2015-05-30 NOTE — Progress Notes (Signed)
NURSING PROGRESS NOTE  Billy [name] [id number] Discharge Data: 05/30/2015 4:59 PM Attending Provider: No att. providers found WUJ:WJXBJY,NWGNFAPCP:HUSAIN,KARRAR, MD   Billy [name] to be D/C'd Skilled nursing facility per MD order via EMS transport. All paperwork prepared by LCSW in yellow packet including hard copy of pain prescription and AVS given to EMS staff.     All IV's will be discontinued and monitored for bleeding.  All belongings will be returned to patient for patient to take home.  Last Documented Vital Signs:  Blood pressure 153/87, pulse 71, temperature 97.8 F (36.6 C), temperature source Oral, resp. rate 20, height 5\' 9"  (1.753 m), weight 50.8 kg (111 lb 15.9 oz), SpO2 96 %.  [name] RN, BS, BSN

## 2015-05-30 NOTE — Progress Notes (Signed)
[facility], all questions answered to her satisfaction, she verbalized understanding that he is a total care patient, as well as daily dressing changes on R amputation.

## 2015-05-30 NOTE — Discharge Summary (Addendum)
[facility]  Physician Discharge Summary   Patient ID: Billy [name] MRN: [id number] DOB/AGE: 79-Sep-1933 79 y.o.  Admit date: 05/17/2015 Discharge date: 05/30/2015  PCP: [name], MD  DISCHARGE DIAGNOSES:  Principal Problem:   Ischemic foot Active Problems:   HTN (hypertension)   Dementia   Protein-calorie malnutrition, severe (HCC)   Alzheimer's disease   Lower GI bleed   DVT of right axillary vein, acute   RECOMMENDATIONS FOR OUTPATIENT FOLLOW UP: 1. CBC and basic metabolic panel in 3 days 2. Monitor blood pressure closely. Adjust dose of metoprolol as indicated. 3. Follow-up with outpatient providers as outlined below.   DISCHARGE CONDITION: fair  Diet recommendation: Heart healthy  Filed Weights   05/17/15 2127 05/19/15 1302 05/27/15 1055  Weight: 54.9 kg (121 lb 0.5 oz) 54.885 kg (121 lb) 50.8 kg (111 lb 15.9 oz)    INITIAL HISTORY: 81 year old African-American male with a past medical history of dementia, diverticulosis, hypertension, presented with rectal bleeding. Underwent colonoscopy which revealed colitis. This was suggestive of inflammatory bowel disease. Subsequently noted to have right lower extremity ischemia. Also found to have a DVT in the right upper extremity. He underwent right above-knee amputation.  Consultants: [facility] gastroenterology, vascular surgery  Procedures:  Colonoscopy 10/28 IMPRESSIONS:  1.mild to moderate colitis throughout status post biopsy seemingly sparing the cecum and proximal ascending and most distal rectum  2. primarily sigmoid small diverticuli with few scattered throughout the remainder of the colon  3. transverse and descending small polyp status post cold biopsy  4. normal terminal ileum  5. otherwise within normal limits as above with cecum and parts of ascending transverse and sigmoid not well seen  EEG "Interpretation: This EEG is abnormal with moderately severe generalized continuous  nonspecific slowing of cerebral activity consistent with moderately severe encephalopathic state. Significance of triphasic sharp waves, which are most often associated with metabolic encephalopathy, is unclear. No frank seizure discharges were recorded."  Right AKA 11/4  Echocardiogram Study Conclusions - Left ventricle: The cavity size was normal. Wall thickness wasnormal. Systolic function was mildly to moderately reduced. Theestimated ejection fraction was in the range of 40% to 45%.Doppler parameters are consistent with abnormal left ventricularrelaxation (grade 1 diastolic dysfunction). - Mitral valve: There was mild regurgitation. - Right atrium: The atrium was mildly dilated. - Pulmonary arteries: PA peak pressure: 32 mm Hg (S). - Pericardium, extracardiac: A small pericardial effusion wasidentified.   HOSPITAL COURSE:   Right lower extremity ischemia Patient was found to have a cold right foot without any pulses. Vascular surgery was consulted. He was thought to have an acutely ischemic right foot. Status post Right AKA. Vascular surgery has been following. Patient remains stable postoperatively. Due to patient's dementia history has been difficult to obtain.Long discussion with patient's power of attorney, Ms. Guinevere Ferrari at [phone] who is patient's POA on 11/3. She had multiple concerns about patient's care. It doesn't appear that medical staff made aware of any problem with right foot before 11/2. Apparently the right foot has been cold since 10/28 per family.   DVT in the right axillary vein This was detected on venous Doppler study. Intravenous heparin was initiated and was held for surgery. Discussed with Dr. Arbie Cookey 11/5 , and was reinitiated approximately 24 hours postoperatively. Due to recent GI bleed and surgery, we were cautious with this approach. Patient has been stable without any evidence for bleeding. He was transitioned to oral Xarelto. Hemoglobin has been  stable. And there has been no recurrence of bleeding.  Fever with leukocytosis Source could have been the right foot. He is now status post amputation. Blood cultures negative so far. Vancomycin and Zosyn was discontinued 11/6. Patient was started on doxycycline. WBC is improved. Platelet counts remain high. This will need to be monitored. WBC also elevated due to steroids.   Rectal bleeding Patient was seen by gastroenterology. He underwent colonoscopy which shows mild-to-moderate colitis. Etiology shows severely active chronic colitis without any evidence for dysplasia. Inflammatory bowel disease was thought to be likely. Patient was started on mesalamine. Oral prednisone also appears to have been recommended by gastroenterology. Patient had a few episodes of bowel movements with dark blood which was thought to be old blood. This has subsided. He will need outpatient follow-up with gastroenterology.  Hypokalemia He will be placed on supplementation. Magnesium was normal.  Brief SVT Noted on telemetry 11/6. Patient was asymptomatic. He was started on a low-dose beta blocker. Telemetry shows brief spurts of tachycardia spontaneously resolving. Dose of metoprolol be increased. Echocardiogram is as above. Due to his dementia he is not a candidate for further cardiac workup.  History of essential hypertension Blood pressure is stable. Continue metoprolol. Nifedipine has been discontinued as dose of metoprolol has been increased due to episodes of SVT.  Loss of consciousness 10/28 This occurred in the hospital while he was having a bowel movement. Could have been vasovagal. Has been stable since then. Workup has been negative so far. Echocardiogram is as above. Borderline Low EF was noted with mild diastolic dysfunction. Considering his dementia he is not a candidate for further workup.  Diverticulosis seen on colonoscopy Avoid constipation.   History of dementia Continue home medications.  Appears to be moderate to advanced based on examination.  Overall, stable. He will need to go to skilled nursing facility for rehabilitation. Discussed with his niece today. Okay for discharge.   PERTINENT LABS:  The results of significant diagnostics from this hospitalization (including imaging, microbiology, ancillary and laboratory) are listed below for reference.    Microbiology: Recent Results (from the past 240 hour(s))  Culture, blood (routine x 2)     Status: None (Preliminary result)   Collection Time: 05/25/15  6:14 AM  Result Value Ref Range Status   Specimen Description BLOOD LEFT ANTECUBITAL  Final   Special Requests BOTTLES DRAWN AEROBIC ONLY 10CCS  Final   Culture NO GROWTH 4 DAYS  Final   Report Status PENDING  Incomplete  Culture, blood (routine x 2)     Status: None (Preliminary result)   Collection Time: 05/25/15  6:20 AM  Result Value Ref Range Status   Specimen Description BLOOD LEFT HAND  Final   Special Requests BOTTLES DRAWN AEROBIC ONLY 4CCS  Final   Culture NO GROWTH 4 DAYS  Final   Report Status PENDING  Incomplete  Surgical pcr screen     Status: None   Collection Time: 05/26/15  2:35 AM  Result Value Ref Range Status   MRSA, PCR NEGATIVE NEGATIVE Final   Staphylococcus aureus NEGATIVE NEGATIVE Final    Comment:        The Xpert SA Assay (FDA approved for NASAL specimens in patients over 23 years of age), is one component of a comprehensive surveillance program.  Test performance has been validated by [facility] for patients greater than or equal to 20 year old. It is not intended to diagnose infection nor to guide or monitor treatment.      Labs: Basic Metabolic Panel:  Recent Labs Lab 05/27/15  [id number] 05/27/15 1800 05/28/15 0311 05/28/15 1401 05/29/15 0559 05/30/15 0456  NA 145 146* 145  --  143 144  K 2.8* 4.5 3.8  --  3.6 3.3*  CL 111 112* 113*  --  111 112*  CO2 20* 19* 22  --  20* 22  GLUCOSE 75 113* 105*  --  87 82  BUN 15  11 14   --  10 7  CREATININE 1.14 1.03 1.06  --  0.88 0.84  CALCIUM 8.1* 8.4* 8.0*  --  8.4* 8.3*  MG  --   --   --  2.5*  --   --    CBC:  Recent Labs Lab 05/24/15 0535  05/26/15 0518 05/27/15 0526 05/28/15 0311 05/29/15 0559 05/30/15 0456  WBC 16.8*  < > 11.1* 14.3* 15.4* 16.5* 12.6*  NEUTROABS 12.4*  --   --   --   --   --   --   HGB 11.9*  < > 11.0* 9.7* 9.1* 10.2* 9.8*  HCT 36.2*  < > 33.7* 30.2* 28.8* 32.3* 31.1*  MCV 87.9  < > 84.9 87.0 88.3 88.0 87.9  PLT 416*  < > 169 501* 541* 605* 629*  < > = values in this interval not displayed.   IMAGING STUDIES Dg Forearm Right  05/22/2015  CLINICAL DATA:  Right arm contraction. EXAM: RIGHT FOREARM - 2 VIEW COMPARISON:  None. FINDINGS: There is no evidence of fracture or other focal bone lesions. No significant arthropathy identified. Soft tissues are unremarkable. IMPRESSION: Normal right forearm. Electronically Signed   By: Irish LackGlenn  Yamagata M.D.   On: 05/22/2015 17:30   Ct Head Wo Contrast  05/21/2015  CLINICAL DATA:  Multiple episodes of unresponsiveness EXAM: CT HEAD WITHOUT CONTRAST TECHNIQUE: Contiguous axial images were obtained from the base of the skull through the vertex without intravenous contrast. COMPARISON:  None. FINDINGS: Age-appropriate atrophy. Mild low attenuation in the white matter diffusely suggesting chronic ischemic change, also age appropriate. No evidence of vascular territory infarct or mass. No hemorrhage or extra-axial fluid. No hydrocephalus. Calvarium intact. IMPRESSION: And age appropriate involutional change with no acute findings Electronically Signed   By: Esperanza Heiraymond  Rubner M.D.   On: 05/21/2015 18:41   Dg Humerus Right  05/22/2015  CLINICAL DATA:  Right arm pain.  No known injury. EXAM: RIGHT HUMERUS - 2+ VIEW COMPARISON:  None. FINDINGS: There is degenerative spur formation at the right elbow of and mild glenohumeral joint degenerative changes. There is also some superior migration of the humeral  head relative to the glenoid. No fracture or dislocation. IMPRESSION: Right shoulder and elbow degenerative changes and possible chronic rotator cuff tear. Electronically Signed   By: Beckie SaltsSteven  Reid M.D.   On: 05/22/2015 17:30   Dg Foot Complete Right  05/24/2015  CLINICAL DATA:  Right foot tenderness.  Altered mental status. EXAM: RIGHT FOOT COMPLETE - 3+ VIEW COMPARISON:  None. FINDINGS: Slightly motion degraded AP image. No fracture or dislocation. Lisfranc joint appears intact. No aggressive-appearing focal osseous lesions. No appreciable degenerative or erosive arthropathy. Small enthesophyte at the lateral base of right fifth metatarsal. Tiny plantar and Achilles right calcaneal spurs. IMPRESSION: No fracture or dislocation.  Enthesophytes as described. Electronically Signed   By: Delbert PhenixJason A Poff M.D.   On: 05/24/2015 19:06    DISCHARGE EXAMINATION: Filed Vitals:   05/29/15 0912 05/29/15 1318 05/29/15 2027 05/30/15 0500  BP: 134/80 153/76 108/64 121/74  Pulse: 108 91 96 90  Temp:  97.9 F (36.6 C)  98.9 F (37.2 C) 98.2 F (36.8 C)  TempSrc:   Oral Oral  Resp:  Height:      Weight:      SpO2:    98%   General appearance: alert, distracted and no distress Resp: clear to auscultation bilaterally Cardio: regular rate and rhythm, S1, S2 normal, no murmur, click, rub or gallop GI: soft, non-tender; bowel sounds normal; no masses,  no organomegaly Neurologic: Disoriented. No other focal deficits. Contracted right upper extremity. It is chronic per his neice. Unchanged in the last 3-4 days.  DISPOSITION: SNF  Discharge Instructions    Call MD for:  difficulty breathing, headache or visual disturbances    Complete by:  As directed      Call MD for:  extreme fatigue    Complete by:  As directed      Call MD for:  persistant dizziness or light-headedness    Complete by:  As directed      Call MD for:  persistant nausea and vomiting    Complete by:  As directed      Call MD for:   severe uncontrolled pain    Complete by:  As directed      Call MD for:  temperature >100.4    Complete by:  As directed      Diet - low sodium heart healthy    Complete by:  As directed      Discharge instructions    Complete by:  As directed   Please try to eat a soft diet without too much fiber for the next 1-2 weeks. You were diagnosed with diverticulosis which is a condition where in the colon strains and sometimes can bleed from the straining so a soft diet is imperative. Please follow-up with gastroenterology as an outpatient as they started him on a medication which may need to continue or not depending on clinical condition. Follow-up with primary care physician as an outpatient. CBC and BMET in 3 days.     Increase activity slowly    Complete by:  As directed            ALLERGIES: No Known Allergies    Current Discharge Medication List    START taking these medications   Details  acetaminophen (TYLENOL) 325 MG tablet Take 1-2 tablets (325-650 mg total) by mouth every 4 (four) hours as needed for mild pain or fever.    doxycycline (VIBRA-TABS) 100 MG tablet Take 1 tablet (100 mg total) by mouth every 12 (twelve) hours. For 10 more days.    Mesalamine (ASACOL) 400 MG CPDR DR capsule Take 2 capsules (800 mg total) by mouth 3 (three) times daily. Qty: 180 capsule, Refills: 0    metoprolol tartrate (LOPRESSOR) 25 MG tablet Take 1 tablet (25 mg total) by mouth 2 (two) times daily.    Nutritional Supplements (ENSURE NUTRITION SHAKE) LIQD Take 1 Can by mouth 3 (three) times daily with meals.    oxyCODONE-acetaminophen (PERCOCET/ROXICET) 5-325 MG tablet Take 1-2 tablets by mouth every 4 (four) hours as needed for moderate pain. Qty: 30 tablet, Refills: 0    potassium chloride SA (K-DUR,KLOR-CON) 20 MEQ tablet Take 1 tablet (20 mEq total) by mouth daily.    predniSONE (DELTASONE) 20 MG tablet Take 1 tablet (20 mg total) by mouth daily with breakfast.    Rivaroxaban (XARELTO  STARTER PACK) 15 & 20 MG TBPK Take as directed on package: Start with one  tablet by mouth twice  a day with food. On Day 22, switch to one 20mg  tablet once a day with food. Qty: 51 each, Refills: 0    rivaroxaban (XARELTO) 20 MG TABS tablet Take 1 tablet (20 mg total) by mouth daily with supper. PLEASE START ONLY AFTER ALL THE TABLETS IN THE STARTER PACK HAVE BEEN TAKEN. Qty: 30 tablet, Refills: 3      CONTINUE these medications which have NOT CHANGED   Details  donepezil (ARICEPT) 5 MG tablet Take 1 tablet by mouth at bedtime. Refills: 11    finasteride (PROSCAR) 5 MG tablet Take 1 tablet by mouth daily. Refills: 11    mirtazapine (REMERON) 7.5 MG tablet Take 1 tablet by mouth at bedtime. Refills: 11    docusate sodium (COLACE) 100 MG capsule Take 1 capsule (100 mg total) by mouth 2 (two) times daily. Qty: 10 capsule, Refills: 0    hydrocortisone (ANUSOL-HC) 25 MG suppository Place 1 suppository (25 mg total) rectally 2 (two) times daily. Qty: 30 suppository, Refills: 0      STOP taking these medications     NIFEdipine (PROCARDIA-XL/ADALAT-CC/NIFEDICAL-XL) 30 MG 24 hr tablet        Follow-up Information    Follow up with Charolett Bumpers, MD. Schedule an appointment as soon as possible for a visit in 3 weeks.   Specialty:  Gastroenterology   Why:  post hospitalization follow up FOR DIARRHEA and colitis   Contact information:   [address] [place] [address] [phone]       Follow up with [name], MD. Schedule an appointment as soon as possible for a visit in 1 week.   Specialty:  Internal Medicine   Why:  post hospitalization follow up   Contact information:   [address] [place] [address] [phone]       Follow up with Waverly Ferrari, MD In 4 weeks.   Specialties:  Vascular Surgery, Cardiology   Why:  Our office will call you to arrange an appointment (sent)   Contact information:   [address] [place] [address] [phone]       TOTAL DISCHARGE TIME: 35 minutes  [facility], [place]  [facility] Pager [phone]  05/30/2015, 11:41 AM

## 2015-05-30 NOTE — Progress Notes (Signed)
MD Rito EhrlichKrishnan paged, per central telemetry patient went into SVT (asymptomatic) and converted back to NSR. Per MD Rito EhrlichKrishnan patient beta blocker dosage has been increased. Will continue to monitor patient.

## 2015-05-30 NOTE — Care Management Note (Signed)
Case Management Note  Patient Details  Name: Jerald KiefSylvester Garceau MRN: [id number] Date of Birth: Mar 02, 1932  Subjective/Objective:                 [facility], as facilitated through CSW.   Action/Plan:   Expected Discharge Date:                  Expected Discharge Plan:  Skilled Nursing Facility  In-House Referral:  Clinical Social Work  Discharge planning Services  CM Consult  Post Acute Care Choice:    Choice offered to:     DME Arranged:    DME Agency:     HH Arranged:    HH Agency:     Status of Service:  Completed, signed off  Medicare Important Message Given:  Yes-third notification given Date Medicare IM Given:    Medicare IM give by:    Date Additional Medicare IM Given:    Additional Medicare Important Message give by:     If discussed at Long Length of Stay Meetings, dates discussed:    Additional Comments:  Lawerance SabalDebbie Oney Folz, RN 05/30/2015, 11:44 AM

## 2015-05-30 NOTE — Progress Notes (Addendum)
  [facility] Progress Note  Subjective  - POD #4  Resting comfortably in bed.   Objective Filed Vitals:   05/30/15 0500  BP: 121/74  Pulse: 90  Temp: 98.2 F (36.8 C)  Resp: 18    Intake/Output Summary (Last 24 hours) at 05/30/15 0855 Last data filed at 05/30/15 0532  Gross per 24 hour  Intake    360 ml  Output    750 ml  Net   -390 ml    Right AKA staple line clean and intact. No drainage. No erythema.   Assessment/Planning: 81 y.o. male is s/p: right AKIA 4 Days Post-Op   Right AKA healing well.  Daily dressing changes.  Ok to d/c to SNF from vascular standpoint.  Follow-up with Dr. Edilia Boickson in 4 weeks.    Raymond GurneyKimberly A Trinh 05/30/2015 8:55 AM --  Laboratory CBC    Component Value Date/Time   WBC 12.6* 05/30/2015 0456   HGB 9.8* 05/30/2015 0456   HCT 31.1* 05/30/2015 0456   PLT 629* 05/30/2015 0456    BMET    Component Value Date/Time   NA 144 05/30/2015 0456   K 3.3* 05/30/2015 0456   CL 112* 05/30/2015 0456   CO2 22 05/30/2015 0456   GLUCOSE 82 05/30/2015 0456   BUN 7 05/30/2015 0456   CREATININE 0.84 05/30/2015 0456   CALCIUM 8.3* 05/30/2015 0456   GFRNONAA >60 05/30/2015 0456   GFRAA >60 05/30/2015 0456    COAG Lab Results  Component Value Date   INR 1.5 02/26/2008   INR 1.6* 02/25/2008   INR 1.6* 02/24/2008   No results found for: PTT  Antibiotics Anti-infectives    Start     Dose/Rate Route Frequency Ordered Stop   05/28/15 1000  doxycycline (VIBRA-TABS) tablet 100 mg     100 mg Oral Every 12 hours 05/28/15 0945     05/27/15 1430  vancomycin (VANCOCIN) IVPB 750 mg/150 ml premix  Status:  Discontinued     750 mg 150 mL/hr over 60 Minutes Intravenous Every 24 hours 05/27/15 1105 05/28/15 0945   05/26/15 1800  cefUROXime (ZINACEF) 1.5 g in dextrose 5 % 50 mL IVPB  Status:  Discontinued     1.5 g 100 mL/hr over 30 Minutes Intravenous Every 12 hours 05/26/15 1759 05/26/15 1806   05/26/15 0600  ceFAZolin (ANCEF)  IVPB 1 g/50 mL premix    Comments:  Send with pt to OR   1 g 100 mL/hr over 30 Minutes Intravenous To Surgery 05/25/15 1216 05/26/15 1423   05/25/15 1430  piperacillin-tazobactam (ZOSYN) IVPB 3.375 g  Status:  Discontinued     3.375 g 12.5 mL/hr over 240 Minutes Intravenous 3 times per day 05/25/15 1332 05/28/15 0945   05/25/15 1430  vancomycin (VANCOCIN) IVPB 1000 mg/200 mL premix  Status:  Discontinued     1,000 mg 200 mL/hr over 60 Minutes Intravenous Every 24 hours 05/25/15 1332 05/27/15 1105       Maris BergerKimberly Trinh, PA-C [facility] Office: [phone] Pager: [phone] 05/30/2015 8:55 AM  Agree with above.  Waverly Ferrarihristopher Skyann Ganim, MD, FACS Beeper [phone] Office: [phone]

## 2015-05-30 NOTE — Progress Notes (Signed)
[facility] will [facility] Anticipated discharge date:11/08 Family notified: Carney BernJean and Malachi BondsGloria Transportation by Sharin MonsPTAR- called at 3:20pm  CSW signing off.  Merlyn LotJenna Holoman, LCSWA Clinical Social Worker [phone]

## 2015-05-30 NOTE — Care Management Important Message (Signed)
Important Message  Patient Details  Name: Billy KiefSylvester [name] MRN: [id number] Date of Birth: 09/21/1931   Medicare Important Message Given:  Yes-third notification given    [name], [name] 05/30/2015, 10:29 AM

## 2015-05-30 NOTE — Discharge Instructions (Signed)
Living With an Amputation  The most common causes of amputation from the hip down include:  · Diseases that:    Reduce the blood flow to an area of your body.    Decrease your body's ability to fight infection.  · Traumatic injuries that cause significant damage to body tissues.  · Birth defects.  · Cancerous lumps (malignant tumors).  Amputation above the hip is usually the result of trauma or birth defect. Disease is a less common cause.  Living with an amputation can be challenging, but you can still live a long, productive life.  WHAT ARE THE COMMON CHALLENGES OF LIVING WITH AN AMPUTATION?  The most common challenges are mobility and self-care. With some new habits, though, it is often possible to do all of the activities you used to do.  You may be able to use a device that substitutes for your limb (prosthesis). The prosthesis helps you adapt more quickly to these challenges. Your health care provider can help select a prosthesis to meet your needs. A person who helps you choose and fits you with a prosthesis (prosthetist) may also help.  A rehabilitation program can also help you gain mobility and self-reliance. Your rehabilitation team may include:  · Physicians.  · Physical and occupational therapists.  · Prosthetists.  · Nurses.  · Social workers.  · Psychologists.  · Dietitians.  Your rehabilitation team will help you with all aspects of recovery and returning to work, home, sports, and your community. You will learn to:  · Get around safely.  · Adjust your home.  · Exercise.  · Use a prosthetic.  · Work through emotional challenges.  · Connect with other people who have gone through the same experience.  Additional challenges may include:  · Grieving period.  · Body image issues.  · Lifestyle issues, such as sex.  · Maintaining a healthy weight.  These issues are normal. Discuss these with your rehabilitation team.  WHEN CAN I RETURN TO MY REGULAR ACTIVITIES?   Returning to your normal activities is part  of healing. Changes can often be made to equipment that allow you to return to a sport or hobby. Some companies design special equipment for this. Discuss all of your leisure interests with your health care provider and prosthetist.  WHEN CAN I RETURN TO WORK?  When you are ready to return to work, your therapists can perform job site evaluations and make recommendations to help you perform your job. You may not be able to return to your same job. Your local Office of Vocational Rehabilitation can assist you in job retraining.  FOR MORE INFORMATION:  Visit these online resources. You can find tips on everything from getting dressed and using bathrooms to driving and travel considerations. These resources can also connect you to a network of emotional support, activities, and innovations. You may also search the Internet to find a local support group.  · Amputee Coalition: http://www.amputee-coalition.org/ensuring-fall-safety/http://www.amputee-coalition.org/ensuring-fall-safety/  · Amputee Support Groups: http://amputee.supportgroups.com/http://amputee.supportgroups.com  · Daily Strength: http://www.dailystrength.org/c/Amputees/support-grouphttp://www.dailystrength.org/c/Amputees/support-group  · National Amputee Foundation: http://www.nationalamputation.org/http://www.nationalamputation.org  · National Center on Health, Physical Activity and Disability: http://www.nchpad.org/http://www.nchpad.org  · Disabled Sports USA: http://www.disabledsportsusa.orghttp://www.disabledsportsusa.org  · American Academy of Orthotists and Prosthetists: http://www.oandp.org/http://www.oandp.org     This information is not intended to replace advice given to you by your health care provider. Make sure you discuss any questions you have with your health care provider.     Document Released: 03/30/2002 Document Revised: 07/29/2014 Document Reviewed: 11/23/2013  Elsevier Interactive Patient   Education ©2016 Elsevier Inc.

## 2015-05-31 ENCOUNTER — Telehealth: Payer: Self-pay | Admitting: Vascular Surgery

## 2015-05-31 NOTE — Telephone Encounter (Signed)
-----   Message from Chuck Hinthristopher S Dickson, MD sent at 05/31/2015  6:55 AM EST ----- Regarding: f/u This pt had right AKA on 05/26/15. He needs f/u visit 1 month post op for staple removal.  Thanks CD

## 2015-06-01 ENCOUNTER — Non-Acute Institutional Stay (SKILLED_NURSING_FACILITY): Payer: Medicare Other | Admitting: Internal Medicine

## 2015-06-01 ENCOUNTER — Encounter: Payer: Self-pay | Admitting: Internal Medicine

## 2015-06-01 DIAGNOSIS — F0280 Dementia in other diseases classified elsewhere without behavioral disturbance: Secondary | ICD-10-CM

## 2015-06-01 DIAGNOSIS — I808 Phlebitis and thrombophlebitis of other sites: Secondary | ICD-10-CM

## 2015-06-01 DIAGNOSIS — G309 Alzheimer's disease, unspecified: Secondary | ICD-10-CM

## 2015-06-01 DIAGNOSIS — I10 Essential (primary) hypertension: Secondary | ICD-10-CM | POA: Diagnosis not present

## 2015-06-01 DIAGNOSIS — K529 Noninfective gastroenteritis and colitis, unspecified: Secondary | ICD-10-CM | POA: Diagnosis not present

## 2015-06-01 DIAGNOSIS — Z89611 Acquired absence of right leg above knee: Secondary | ICD-10-CM | POA: Diagnosis not present

## 2015-06-01 DIAGNOSIS — E43 Unspecified severe protein-calorie malnutrition: Secondary | ICD-10-CM | POA: Diagnosis not present

## 2015-06-01 DIAGNOSIS — N400 Enlarged prostate without lower urinary tract symptoms: Secondary | ICD-10-CM | POA: Diagnosis not present

## 2015-06-01 DIAGNOSIS — I82A11 Acute embolism and thrombosis of right axillary vein: Secondary | ICD-10-CM

## 2015-06-01 NOTE — Progress Notes (Signed)
This encounter was created in error - please disregard.  This encounter was created in error - please disregard.

## 2015-06-01 NOTE — Progress Notes (Signed)
Patient ID: Billy [name], male   DOB: Nov 23, 1931, 81 y.o.   MRN: [id number]    HISTORY AND PHYSICAL   DATE: 06/01/15  Location:  [facility]    Place of Service: SNF (31)   Extended Emergency Contact Information Primary Emergency Contact: Adelene Idler [place] Home Phone: [phone] Mobile Phone: [phone] Relation: Niece Secondary Emergency Contact: Reather Converse [place] Mobile Phone: [phone] Relation: Other  Advanced Directive information  DNR  Chief Complaint  Patient presents with  . New Admit To SNF    HPI:  81 yo male seen today as a new admission into SNF following hospital stay for right AKA due to ischemic foot, RUE DVT, severe protein calorie malnutrition and dementia. He presented with rectal bleeding and colonoscopy revealed colitis s/o IBD. He was started on mesalamine and prednisone. During admission, RLE ischemia noted and she underwent right AKA on 11/4th by vascular sx. He also was dx with acute RUE DVT which was tx with IV heparin --> xeralto. EEG performed and was abnormal (moderately severe generalized continuous nonspecific slowing of cerebral activity consistent with moderately severe encephalopathic state; no frank sz activity noted). 2D echo showed EF 40-45% with grade 1 diastolic dysfunction, mild MR and mild RA dilation, small pericardial effusion.   He has no c/o today. No syncopal episodes. No falls. He is taking doxy for colitis. He is a poor historian due to dementia. Hx obtained form chart  HTN - stable on lopressor  Dementia -stable on aricept. Takes remeron for appetite stimulation. He gets nutritional supplements  BPH - stable on proscar  Hyperlipidemia - diet controlled  Past Medical History  Diagnosis Date  . Hypertension   . Hyperlipidemia   . Alzheimer's disease   . HTN (hypertension) 11/21/2014  . Dementia 11/21/2014  . BPH (benign prostatic hyperplasia) 11/21/2014      Past Surgical History  Procedure Laterality Date  . Colonoscopy N/A 05/19/2015    Procedure: COLONOSCOPY;  Surgeon: Vida Rigger, MD;  Location: [facility] ENDOSCOPY;  Service: Endoscopy;  Laterality: N/A;  . Amputation Right 05/26/2015    Procedure: RIGHT ABOVE THE KNEE  AMPUTATION ;  Surgeon: Chuck Hint, MD;  Location: [facility] OR;  Service: Vascular;  Laterality: Right;    Patient Care Team: [name], MD as PCP - General (Internal Medicine)  Social History   Social History  . Marital Status: Married    Spouse Name: N/A  . Number of Children: N/A  . Years of Education: N/A   Occupational History  . Not on file.   Social History Main Topics  . Smoking status: Former Games developer  . Smokeless tobacco: Not on file  . Alcohol Use: No  . Drug Use: No  . Sexual Activity: Not on file   Other Topics Concern  . Not on file   Social History Narrative     reports that he has quit smoking. He does not have any smokeless tobacco history on file. He reports that he does not drink alcohol or use illicit drugs.  History reviewed. No pertinent family history. Family Status  Relation Status Death Age  . Mother Deceased   . Father Deceased      There is no immunization history on file for this patient.  No Known Allergies  Medications: Patient's Medications  New Prescriptions   No medications on file  Previous Medications   ACETAMINOPHEN (TYLENOL) 325 MG TABLET    Take 1-2 tablets (325-650 mg total)  by mouth every 4 (four) hours as needed for mild pain or fever.   DOCUSATE SODIUM (COLACE) 100 MG CAPSULE    Take 1 capsule (100 mg total) by mouth 2 (two) times daily.   DONEPEZIL (ARICEPT) 5 MG TABLET    Take 1 tablet by mouth at bedtime.   DOXYCYCLINE (VIBRA-TABS) 100 MG TABLET    Take 1 tablet (100 mg total) by mouth every 12 (twelve) hours. For 10 more days.   FINASTERIDE (PROSCAR) 5 MG TABLET    Take 1 tablet by mouth daily.   HYDROCORTISONE (ANUSOL-HC) 25 MG SUPPOSITORY     Place 1 suppository (25 mg total) rectally 2 (two) times daily.   MESALAMINE (ASACOL) 400 MG CPDR DR CAPSULE    Take 2 capsules (800 mg total) by mouth 3 (three) times daily.   METOPROLOL TARTRATE (LOPRESSOR) 25 MG TABLET    Take 1 tablet (25 mg total) by mouth 2 (two) times daily.   MIRTAZAPINE (REMERON) 7.5 MG TABLET    Take 1 tablet by mouth at bedtime.   NUTRITIONAL SUPPLEMENTS (ENSURE NUTRITION SHAKE) LIQD    Take 1 Can by mouth 3 (three) times daily with meals.   OXYCODONE-ACETAMINOPHEN (PERCOCET/ROXICET) 5-325 MG TABLET    Take 1-2 tablets by mouth every 4 (four) hours as needed for moderate pain.   POTASSIUM CHLORIDE SA (K-DUR,KLOR-CON) 20 MEQ TABLET    Take 1 tablet (20 mEq total) by mouth daily.   PREDNISONE (DELTASONE) 20 MG TABLET    Take 1 tablet (20 mg total) by mouth daily with breakfast.   RIVAROXABAN (XARELTO STARTER PACK) 15 & 20 MG TBPK    Take as directed on package: Start with one 15mg  tablet by mouth twice a day with food. On Day 22, switch to one 20mg  tablet once a day with food.   RIVAROXABAN (XARELTO) 20 MG TABS TABLET    Take 1 tablet (20 mg total) by mouth daily with supper. PLEASE START ONLY AFTER ALL THE TABLETS IN THE STARTER PACK HAVE BEEN TAKEN.  Modified Medications   No medications on file  Discontinued Medications   No medications on file    Review of Systems  Unable to perform ROS: Dementia    Filed Vitals:   06/01/15 1440  BP: 138/72  Pulse: 74  Temp: 98.5 F (36.9 C)  TempSrc: Oral  Resp: 20  SpO2: 99%   There is no weight on file to calculate BMI.  Physical Exam  Constitutional: He appears well-developed.  Frail appearing in NAD. LLE hanging off bed  HENT:  Mouth/Throat: Oropharynx is clear and moist.  Eyes: Pupils are equal, round, and reactive to light. No scleral icterus.  Neck: Neck supple. Carotid bruit is not present.  Cardiovascular: Normal rate, regular rhythm and intact distal pulses.  Exam reveals no gallop and no friction rub.     Murmur (1/6 SEM) heard. No LLE edema. No left calf TTP. Right AKA  Pulmonary/Chest: Effort normal and breath sounds normal. He has no wheezes. He has no rales. He exhibits no tenderness.  Abdominal: Soft. Bowel sounds are normal. He exhibits no distension, no abdominal bruit, no pulsatile midline mass and no mass. There is no tenderness. There is no rebound and no guarding.  Musculoskeletal: He exhibits edema.  Right AKA dsg is dirty. No d/c or bleeding  Lymphadenopathy:    He has no cervical adenopathy.  Neurological: He is alert.  Skin: Skin is warm and dry. No rash noted.  Psychiatric: He has  a normal mood and affect. His behavior is normal.     Labs reviewed: Admission on 05/17/2015, Discharged on 05/30/2015  No results displayed because visit has over 200 results.  CBC Latest Ref Rng 05/30/2015 05/29/2015 05/28/2015  WBC 4.0 - 10.5 K/uL 12.6(H) 16.5(H) 15.4(H)  Hemoglobin 13.0 - 17.0 g/dL 2.1(H) 10.2(L) 9.1(L)  Hematocrit 39.0 - 52.0 % 31.1(L) 32.3(L) 28.8(L)  Platelets 150 - 400 K/uL 629(H) 605(H) 541(H)    CMP Latest Ref Rng 05/30/2015 05/29/2015 05/28/2015  Glucose 65 - 99 mg/dL 82 87 086(V)  BUN 6 - 20 mg/dL Creatinine 0.61 - 1.24 mg/dL 7.84 6.96 2.95  Sodium 135 - 145 mmol/L 144 143 145  Potassium 3.5 - 5.1 mmol/L 3.3(L) 3.6 3.8  Chloride 101 - 111 mmol/L 112(H) 111 113(H)  CO2 22 - 32 mmol/L 22 20(L) 22  Calcium 8.9 - 10.3 mg/dL 8.3(L) 8.4(L) 8.0(L)  Total Protein 6.5 - 8.1 g/dL - - -  Total Bilirubin 0.3 - 1.2 mg/dL - - -  Alkaline Phos 38 - 126 U/L - - -  AST 15 - 41 U/L - - -  ALT 17 - 63 U/L - - -    No results found for: HGBA1C Lipid Panel  No results found for: CHOL, TRIG, HDL, CHOLHDL, VLDL, LDLCALC, LDLDIRECT      Dg Forearm Right  05/22/2015  CLINICAL DATA:  Right arm contraction. EXAM: RIGHT FOREARM - 2 VIEW COMPARISON:  None. FINDINGS: There is no evidence of fracture or other focal bone lesions. No significant arthropathy identified. Soft  tissues are unremarkable. IMPRESSION: Normal right forearm. Electronically Signed   By: Irish Lack M.D.   On: 05/22/2015 17:30   Ct Head Wo Contrast  05/21/2015  CLINICAL DATA:  Multiple episodes of unresponsiveness EXAM: CT HEAD WITHOUT CONTRAST TECHNIQUE: Contiguous axial images were obtained from the base of the skull through the vertex without intravenous contrast. COMPARISON:  None. FINDINGS: Age-appropriate atrophy. Mild low attenuation in the white matter diffusely suggesting chronic ischemic change, also age appropriate. No evidence of vascular territory infarct or mass. No hemorrhage or extra-axial fluid. No hydrocephalus. Calvarium intact. IMPRESSION: And age appropriate involutional change with no acute findings Electronically Signed   By: Esperanza Heir M.D.   On: 05/21/2015 18:41   Dg Humerus Right  05/22/2015  CLINICAL DATA:  Right arm pain.  No known injury. EXAM: RIGHT HUMERUS - 2+ VIEW COMPARISON:  None. FINDINGS: There is degenerative spur formation at the right elbow of and mild glenohumeral joint degenerative changes. There is also some superior migration of the humeral head relative to the glenoid. No fracture or dislocation. IMPRESSION: Right shoulder and elbow degenerative changes and possible chronic rotator cuff tear. Electronically Signed   By: Beckie Salts M.D.   On: 05/22/2015 17:30   Dg Foot Complete Right  05/24/2015  CLINICAL DATA:  Right foot tenderness.  Altered mental status. EXAM: RIGHT FOOT COMPLETE - 3+ VIEW COMPARISON:  None. FINDINGS: Slightly motion degraded AP image. No fracture or dislocation. Lisfranc joint appears intact. No aggressive-appearing focal osseous lesions. No appreciable degenerative or erosive arthropathy. Small enthesophyte at the lateral base of right fifth metatarsal. Tiny plantar and Achilles right calcaneal spurs. IMPRESSION: No fracture or dislocation.  Enthesophytes as described. Electronically Signed   By: Delbert Phenix M.D.   On:  05/24/2015 19:06     Assessment/Plan   ICD-9-CM ICD-10-CM   1. Colitis, acute 558.9 K52.9   2. DVT of right axillary  vein, acute 453.84 I80.8   3. Essential hypertension 401.9 I10   4. Alzheimer's dementia without behavioral disturbance, unspecified timing of dementia onset 331.0 G30.9    294.10 F02.80   5. Protein-calorie malnutrition, severe (HCC) 262 E43   6. BPH (benign prostatic hyperplasia) 600.00 N40.0   7. Status post above knee amputation of right lower extremity (HCC) V49.76 Z89.611    due to ischemic foot    Cont current meds as ordered  Cont PT/OT as ordered. ST as indicated  F/u with ortho as scheduled  Wound care as ordered. Dressing changes as ordered  GOAL: short term rehab and d/c home when medically appropriate. Communicated with pt and nursing.  Will follow  Zyia Kaneko S. Ancil Linsey  [facility] [address] [place], [place] [address] [phone] Cell (Monday-Friday 8 AM - 5 PM) [phone] After 5 PM and follow prompts

## 2015-06-23 ENCOUNTER — Encounter: Payer: Self-pay | Admitting: Vascular Surgery

## 2015-06-26 ENCOUNTER — Non-Acute Institutional Stay (SKILLED_NURSING_FACILITY): Payer: Medicare Other | Admitting: Internal Medicine

## 2015-06-26 ENCOUNTER — Encounter: Payer: Self-pay | Admitting: Internal Medicine

## 2015-06-26 DIAGNOSIS — I808 Phlebitis and thrombophlebitis of other sites: Secondary | ICD-10-CM | POA: Diagnosis not present

## 2015-06-26 DIAGNOSIS — F0280 Dementia in other diseases classified elsewhere without behavioral disturbance: Secondary | ICD-10-CM | POA: Diagnosis not present

## 2015-06-26 DIAGNOSIS — I10 Essential (primary) hypertension: Secondary | ICD-10-CM | POA: Diagnosis not present

## 2015-06-26 DIAGNOSIS — Z89611 Acquired absence of right leg above knee: Secondary | ICD-10-CM

## 2015-06-26 DIAGNOSIS — K529 Noninfective gastroenteritis and colitis, unspecified: Secondary | ICD-10-CM

## 2015-06-26 DIAGNOSIS — G309 Alzheimer's disease, unspecified: Secondary | ICD-10-CM

## 2015-06-26 DIAGNOSIS — E43 Unspecified severe protein-calorie malnutrition: Secondary | ICD-10-CM | POA: Diagnosis not present

## 2015-06-26 DIAGNOSIS — N400 Enlarged prostate without lower urinary tract symptoms: Secondary | ICD-10-CM

## 2015-06-26 DIAGNOSIS — I82A11 Acute embolism and thrombosis of right axillary vein: Secondary | ICD-10-CM

## 2015-06-30 ENCOUNTER — Ambulatory Visit (INDEPENDENT_AMBULATORY_CARE_PROVIDER_SITE_OTHER): Payer: Medicare Other | Admitting: Vascular Surgery

## 2015-06-30 ENCOUNTER — Encounter: Payer: Self-pay | Admitting: Vascular Surgery

## 2015-06-30 VITALS — BP 115/70 | HR 85 | Temp 97.8°F | Resp 14 | Ht 70.0 in | Wt 105.0 lb

## 2015-06-30 DIAGNOSIS — Z48812 Encounter for surgical aftercare following surgery on the circulatory system: Secondary | ICD-10-CM

## 2015-06-30 NOTE — Addendum Note (Signed)
Addended by: Adria DillELDRIDGE-[name], Hanni Milford L on: 06/30/2015 04:56 PM   Modules accepted: Orders

## 2015-06-30 NOTE — Progress Notes (Signed)
Patient name: Billy [name] MRN: [id number] DOB: April 02, 1932 Sex: male  REASON FOR VISIT: Follow up after right above-the-knee amputation  HPI: Billy KiefSylvester Heinzel is a 81 y.o. male who developed a profoundly ischemic right foot that was not salvageable. He had a right knee contracture and was not felt to be a candidate for a below the knee amputation given his debilitated state, the contracture, and malnutrition. He underwent a right above-the-knee amputation on 05/26/2015. He comes in for a one-month follow up visit. He has no specific complaints.  Current Outpatient Prescriptions  Medication Sig Dispense Refill  . acetaminophen (TYLENOL) 325 MG tablet Take 1-2 tablets (325-650 mg total) by mouth every 4 (four) hours as needed for mild pain or fever.    . docusate sodium (COLACE) 100 MG capsule Take 1 capsule (100 mg total) by mouth 2 (two) times daily. 10 capsule 0  . donepezil (ARICEPT) 5 MG tablet Take 1 tablet by mouth at bedtime.  11  . finasteride (PROSCAR) 5 MG tablet Take 1 tablet by mouth daily.  11  . metoprolol tartrate (LOPRESSOR) 25 MG tablet Take 1 tablet (25 mg total) by mouth 2 (two) times daily.    . mirtazapine (REMERON) 7.5 MG tablet Take 1 tablet by mouth at bedtime.  11  . Nutritional Supplements (ENSURE NUTRITION SHAKE) LIQD Take 1 Can by mouth 3 (three) times daily with meals.    Marland Kitchen. oxyCODONE-acetaminophen (PERCOCET/ROXICET) 5-325 MG tablet Take 1-2 tablets by mouth every 4 (four) hours as needed for moderate pain. 30 tablet 0  . potassium chloride SA (K-DUR,KLOR-CON) 20 MEQ tablet Take 1 tablet (20 mEq total) by mouth daily.    . predniSONE (DELTASONE) 20 MG tablet Take 1 tablet (20 mg total) by mouth daily with breakfast.    . Rivaroxaban (XARELTO STARTER PACK) 15 & 20 MG TBPK Take as directed on package: Start with one 15mg  tablet by mouth twice a day with food. On Day 22, switch to one 20mg  tablet once a day with food. 51 each 0  . rivaroxaban (XARELTO) 20 MG  TABS tablet Take 1 tablet (20 mg total) by mouth daily with supper. PLEASE START ONLY AFTER ALL THE TABLETS IN THE STARTER PACK HAVE BEEN TAKEN. 30 tablet 3  . doxycycline (VIBRA-TABS) 100 MG tablet Take 1 tablet (100 mg total) by mouth every 12 (twelve) hours. For 10 more days. (Patient not taking: Reported on 06/30/2015)    . hydrocortisone (ANUSOL-HC) 25 MG suppository Place 1 suppository (25 mg total) rectally 2 (two) times daily. (Patient not taking: Reported on 06/30/2015) 30 suppository 0  . Mesalamine (ASACOL) 400 MG CPDR DR capsule Take 2 capsules (800 mg total) by mouth 3 (three) times daily. (Patient not taking: Reported on 06/30/2015) 180 capsule 0   No current facility-administered medications for this visit.    REVIEW OF SYSTEMS:  [X]  denotes positive finding, [ ]  denotes negative finding Cardiac  Comments:  Chest pain or chest pressure:    Shortness of breath upon exertion:    Short of breath when lying flat:    Irregular heart rhythm:    Constitutional    Fever or chills:      PHYSICAL EXAM: Filed Vitals:   06/30/15 1149  BP: 115/70  Pulse: 85  Temp: 97.8 F (36.6 C)  Resp: 14  Height: 5\' 10"  (1.778 m)  Weight: 105 lb (47.628 kg)  SpO2: 100%    GENERAL: The patient is a well-nourished male, in no acute distress. The vital  signs are documented above. CARDIOVASCULAR: There is a regular rate and rhythm. PULMONARY: There is good air exchange bilaterally without wheezing or rales. His right AKA is healing nicely. Staples were removed in the office today. He has no wounds on his left foot which appears adequately perfused although I cannot palpate pedal pulses.  MEDICAL ISSUES:  STATUS POST RIGHT AKA: The patient is doing well status post right AKA. We removed his staples in the office today. He is interested in and trying to obtain a prosthesis and I think this is reasonable. I am sending him to [facility] for evaluation. I will order follow up ABIs in 1 year and I'll see  him back at that time. He was to call sooner if he has problems.  Waverly Ferrari [facility] Beeper: [phone]

## 2015-07-03 DIAGNOSIS — I82A11 Acute embolism and thrombosis of right axillary vein: Secondary | ICD-10-CM | POA: Diagnosis not present

## 2015-07-03 DIAGNOSIS — G309 Alzheimer's disease, unspecified: Secondary | ICD-10-CM | POA: Diagnosis not present

## 2015-07-03 DIAGNOSIS — Z4781 Encounter for orthopedic aftercare following surgical amputation: Secondary | ICD-10-CM | POA: Diagnosis not present

## 2015-07-03 DIAGNOSIS — F0280 Dementia in other diseases classified elsewhere without behavioral disturbance: Secondary | ICD-10-CM | POA: Diagnosis not present

## 2015-08-09 ENCOUNTER — Encounter (HOSPITAL_COMMUNITY): Payer: Self-pay | Admitting: Emergency Medicine

## 2015-08-09 ENCOUNTER — Emergency Department (HOSPITAL_COMMUNITY)
Admission: EM | Admit: 2015-08-09 | Discharge: 2015-08-09 | Disposition: A | Discharge status: Home / Self Care | Payer: Medicare Other | Attending: Emergency Medicine | Admitting: Emergency Medicine

## 2015-08-09 DIAGNOSIS — Z8639 Personal history of other endocrine, nutritional and metabolic disease: Secondary | ICD-10-CM | POA: Diagnosis not present

## 2015-08-09 DIAGNOSIS — K921 Melena: Secondary | ICD-10-CM | POA: Insufficient documentation

## 2015-08-09 DIAGNOSIS — N390 Urinary tract infection, site not specified: Secondary | ICD-10-CM | POA: Insufficient documentation

## 2015-08-09 DIAGNOSIS — Z7952 Long term (current) use of systemic steroids: Secondary | ICD-10-CM | POA: Insufficient documentation

## 2015-08-09 DIAGNOSIS — N400 Enlarged prostate without lower urinary tract symptoms: Secondary | ICD-10-CM | POA: Insufficient documentation

## 2015-08-09 DIAGNOSIS — Z8701 Personal history of pneumonia (recurrent): Secondary | ICD-10-CM | POA: Insufficient documentation

## 2015-08-09 DIAGNOSIS — Z79899 Other long term (current) drug therapy: Secondary | ICD-10-CM | POA: Insufficient documentation

## 2015-08-09 DIAGNOSIS — R197 Diarrhea, unspecified: Secondary | ICD-10-CM

## 2015-08-09 DIAGNOSIS — I10 Essential (primary) hypertension: Secondary | ICD-10-CM | POA: Insufficient documentation

## 2015-08-09 DIAGNOSIS — F0280 Dementia in other diseases classified elsewhere without behavioral disturbance: Secondary | ICD-10-CM | POA: Diagnosis not present

## 2015-08-09 DIAGNOSIS — Z87891 Personal history of nicotine dependence: Secondary | ICD-10-CM | POA: Insufficient documentation

## 2015-08-09 DIAGNOSIS — G309 Alzheimer's disease, unspecified: Secondary | ICD-10-CM | POA: Insufficient documentation

## 2015-08-09 LAB — COMPREHENSIVE METABOLIC PANEL
ALK PHOS: 74 U/L (ref 38–126)
ALT: 13 U/L — ABNORMAL LOW (ref 17–63)
ANION GAP: 11 (ref 5–15)
AST: 17 U/L (ref 15–41)
Albumin: 3.1 g/dL — ABNORMAL LOW (ref 3.5–5.0)
BUN: 18 mg/dL (ref 6–20)
CALCIUM: 8.6 mg/dL — AB (ref 8.9–10.3)
CO2: 22 mmol/L (ref 22–32)
Chloride: 108 mmol/L (ref 101–111)
Creatinine, Ser: 1.11 mg/dL (ref 0.61–1.24)
GFR calc non Af Amer: >60 mL/min (ref >60)
Glucose, Bld: 73 mg/dL (ref 65–99)
Potassium: 3.9 mmol/L (ref 3.5–5.1)
SODIUM: 141 mmol/L (ref 135–145)
TOTAL PROTEIN: 7 g/dL (ref 6.5–8.1)
Total Bilirubin: 0.8 mg/dL (ref 0.3–1.2)

## 2015-08-09 LAB — TYPE AND SCREEN
ABO/RH(D): A POS
ANTIBODY SCREEN: NEGATIVE

## 2015-08-09 LAB — CBC WITH DIFFERENTIAL/PLATELET
BASOS PCT: 1 %
Basophils Absolute: 0 10*3/uL (ref 0.0–0.1)
Eosinophils Absolute: 0.1 10*3/uL (ref 0.0–0.7)
Eosinophils Relative: 1 %
HEMATOCRIT: 33.9 % — AB (ref 39.0–52.0)
HEMOGLOBIN: 10.5 g/dL — AB (ref 13.0–17.0)
LYMPHS PCT: 21 %
Lymphs Abs: 1.3 10*3/uL (ref 0.7–4.0)
MCH: 27.9 pg (ref 26.0–34.0)
MCHC: 31 g/dL (ref 30.0–36.0)
MCV: 89.9 fL (ref 78.0–100.0)
Monocytes Absolute: 1.6 10*3/uL — ABNORMAL HIGH (ref 0.1–1.0)
Monocytes Relative: 25 %
NEUTROS ABS: 3.3 10*3/uL (ref 1.7–7.7)
NEUTROS PCT: 52 %
Platelets: 508 10*3/uL — ABNORMAL HIGH (ref 150–400)
RBC: 3.77 MIL/uL — AB (ref 4.22–5.81)
RDW: 15.2 % (ref 11.5–15.5)
WBC: 6.3 10*3/uL (ref 4.0–10.5)

## 2015-08-09 LAB — URINE MICROSCOPIC-ADD ON

## 2015-08-09 LAB — ABO/RH: ABO/RH(D): A POS

## 2015-08-09 LAB — POC OCCULT BLOOD, ED: Fecal Occult Bld: POSITIVE — AB

## 2015-08-09 LAB — URINALYSIS, ROUTINE W REFLEX MICROSCOPIC
BILIRUBIN URINE: NEGATIVE
GLUCOSE, UA: NEGATIVE mg/dL
Ketones, ur: 40 mg/dL — AB
Nitrite: POSITIVE — AB
PROTEIN: 30 mg/dL — AB
Specific Gravity, Urine: 1.02 (ref 1.005–1.030)
pH: 5.5 (ref 5.0–8.0)

## 2015-08-09 LAB — PROTIME-INR
INR: 1.73 — AB (ref 0.00–1.49)
PROTHROMBIN TIME: 20.2 s — AB (ref 11.6–15.2)

## 2015-08-09 MED ORDER — ACIDOPHILUS PROBIOTIC 10 MG PO TABS
10.0000 mg | ORAL_TABLET | Freq: Three times a day (TID) | ORAL | Status: AC
Start: 2015-08-09 — End: ?

## 2015-08-09 MED ORDER — DEXTROSE 5 % IV SOLN
1.0000 g | Freq: Once | INTRAVENOUS | Status: AC
  Administered 2015-08-09: 1 g via INTRAVENOUS
  Filled 2015-08-09: qty 10

## 2015-08-09 MED ORDER — SODIUM CHLORIDE 0.9 % IV BOLUS (SEPSIS)
1000.0000 mL | Freq: Once | INTRAVENOUS | Status: AC
  Administered 2015-08-09: 1000 mL via INTRAVENOUS

## 2015-08-09 MED ORDER — CEPHALEXIN 500 MG PO CAPS: 500.0000 mg | ORAL_CAPSULE | Freq: Two times a day (BID) | ORAL | Status: DC

## 2015-08-09 NOTE — ED Notes (Signed)
Per EMS blood in stool starting yesterday and getting worse today

## 2015-08-09 NOTE — Discharge Instructions (Signed)
Urinary Tract Infection Urinary tract infections (UTIs) can develop anywhere along your urinary tract. Your urinary tract is your body's drainage system for removing wastes and extra water. Your urinary tract includes two kidneys, two ureters, a bladder, and a urethra. Your kidneys are a pair of bean-shaped organs. Each kidney is about the size of your fist. They are located below your ribs, one on each side of your spine. CAUSES Infections are caused by microbes, which are microscopic organisms, including fungi, viruses, and bacteria. These organisms are so small that they can only be seen through a microscope. Bacteria are the microbes that most commonly cause UTIs. SYMPTOMS  Symptoms of UTIs may vary by age and gender of the patient and by the location of the infection. Symptoms in young women typically include a frequent and intense urge to urinate and a painful, burning feeling in the bladder or urethra during urination. Older women and men are more likely to be tired, shaky, and weak and have muscle aches and abdominal pain. A fever may mean the infection is in your kidneys. Other symptoms of a kidney infection include pain in your back or sides below the ribs, nausea, and vomiting. DIAGNOSIS To diagnose a UTI, your caregiver will ask you about your symptoms. Your caregiver will also ask you to provide a urine sample. The urine sample will be tested for bacteria and white blood cells. White blood cells are made by your body to help fight infection. TREATMENT  Typically, UTIs can be treated with medication. Because most UTIs are caused by a bacterial infection, they usually can be treated with the use of antibiotics. The choice of antibiotic and length of treatment depend on your symptoms and the type of bacteria causing your infection. HOME CARE INSTRUCTIONS  If you were prescribed antibiotics, take them exactly as your caregiver instructs you. Finish the medication even if you feel better after  you have only taken some of the medication.  Drink enough water and fluids to keep your urine clear or pale yellow.  Avoid caffeine, tea, and carbonated beverages. They tend to irritate your bladder.  Empty your bladder often. Avoid holding urine for long periods of time.  Empty your bladder before and after sexual intercourse.  After a bowel movement, women should cleanse from front to back. Use each tissue only once. SEEK MEDICAL CARE IF:   You have back pain.  You develop a fever.  Your symptoms do not begin to resolve within 3 days. SEEK IMMEDIATE MEDICAL CARE IF:   You have severe back pain or lower abdominal pain.  You develop chills.  You have nausea or vomiting.  You have continued burning or discomfort with urination. MAKE SURE YOU:   Understand these instructions.  Will watch your condition.  Will get help right away if you are not doing well or get worse.   This information is not intended to replace advice given to you by your health care provider. Make sure you discuss any questions you have with your health care provider.   Document Released: 04/17/2005 Document Revised: 03/29/2015 Document Reviewed: 08/16/2011 Elsevier Interactive Patient Education 2016 Else[employer] Inc. Bloody Diarrhea Bloody diarrhea can be caused by many different conditions. Most of the time bloody diarrhea is the result of food poisoning or minor infections. Bloody diarrhea usually improves over 2 to 3 days of rest and fluid replacement. Other conditions that can cause bloody diarrhea include:  Internal bleeding.  Infection.  Diseases of the bowel and colon. Internal  bleeding from an ulcer or bowel disease can be severe and requires hospital care or even surgery. DIAGNOSIS  To find out what is wrong your caregiver may check your:  Stool.  Blood.  Results from a test that looks inside the body (endoscopy). TREATMENT   Get plenty of rest.  Drink enough water and fluids to  keep your urine clear or pale yellow.  Do not smoke.  Solid foods and dairy products should be avoided until your illness improves.  As you improve, slowly return to a regular diet with easily-digested foods first. Examples are:  Bananas.  Rice.  Toast.  Crackers. You should only need these for about 2 days before adding more normal foods to your diet.  Avoid spicy or fatty foods as well as caffeine and alcohol for several days.  Medicine to control cramping and diarrhea can relieve symptoms but may prolong some cases of bloody diarrhea. Antibiotics can speed recovery from diarrhea due to some bacterial infections. Call your caregiver if diarrhea does not get better in 3 days. SEEK MEDICAL CARE IF:   You do not improve after 3 days.  Your diarrhea improves but your stool appears black. SEEK IMMEDIATE MEDICAL CARE IF:   You become extremely weak or faint.  You become very sweaty.  You have increased pain or bleeding.  You develop repeated vomiting.  You vomit and you see blood or the vomit looks black in color.  You have a fever.   This information is not intended to replace advice given to you by your health care provider. Make sure you discuss any questions you have with your health care provider.   Document Released: 07/08/2005 Document Revised: 07/29/2014 Document Reviewed: 06/09/2009 Elsevier Interactive Patient Education [employer]

## 2015-08-09 NOTE — ED Provider Notes (Signed)
CSN: [id number]     Arrival date & time 08/09/15  1434 History   First MD Initiated Contact with Patient 08/09/15 1518     Chief Complaint  Patient presents with  . Blood In Stools   Level V Caveat: Dementia   Zethan Alfieri is a 82 y.o. male with a history of hypertension, hyperlipidemia, dementia, and DVT under L2 presents to the emergency department complaining of bright red blood in his diarrhea for the past 2 days. Caregiver is at bedside who reports the patient has had bright red blood as diarrhea since yesterday. She reports 4-5 episodes of loose stool with bright red blood today. Patient denies any abdominal pain. Caregiver reports patient has had some increased urinary frequency over the past several days. She reports he has been eating and drinking well. Patient was seen by gastroenterologist Dr. Claretha Cooper on 04/2015 with similar bright red blood per rectum and was diagnosed with noninfectious colitis. They deny any fevers, vomiting, abdominal pain, hematuria, coughing, wheezing, shortness of breath, chest pain, lightheadedness or dizziness.  (Consider location/radiation/quality/duration/timing/severity/associated sxs/prior Treatment) The history is provided by the patient and a caregiver. No language interpreter was used.    Past Medical History  Diagnosis Date  . Hypertension   . Hyperlipidemia   . Alzheimer's disease   . HTN (hypertension) 11/21/2014  . Dementia 11/21/2014  . BPH (benign prostatic hyperplasia) 11/21/2014   Past Surgical History  Procedure Laterality Date  . Colonoscopy N/A 05/19/2015    Procedure: COLONOSCOPY;  Surgeon: Vida Rigger, MD;  Location: [facility] ENDOSCOPY;  Service: Endoscopy;  Laterality: N/A;  . Amputation Right 05/26/2015    Procedure: RIGHT ABOVE THE KNEE  AMPUTATION ;  Surgeon: Chuck Hint, MD;  Location: [facility] OR;  Service: Vascular;  Laterality: Right;   History reviewed. No pertinent family history. Social History  Substance Use Topics  .  Smoking status: Former Smoker    Quit date: 06/29/2005  . Smokeless tobacco: Never Used  . Alcohol Use: No    Review of Systems  Unable to perform ROS: Dementia  Constitutional: Negative for fever.  Respiratory: Negative for cough and shortness of breath.   Cardiovascular: Negative for chest pain.  Gastrointestinal: Positive for diarrhea and blood in stool. Negative for nausea, vomiting and abdominal pain.  Genitourinary: Positive for frequency. Negative for hematuria.  Skin: Negative for rash.  Neurological: Negative for light-headedness.      Allergies  Review of patient's allergies indicates no known allergies.  Home Medications   Prior to Admission medications   Medication Sig Start Date End Date Taking? Authorizing Provider  acetaminophen (TYLENOL) 325 MG tablet Take 1-2 tablets (325-650 mg total) by mouth every 4 (four) hours as needed for mild pain or fever. 05/30/15  Yes Osvaldo Shipper, MD  donepezil (ARICEPT) 5 MG tablet Take 1 tablet by mouth at bedtime. 10/25/14  Yes Historical Provider, MD  finasteride (PROSCAR) 5 MG tablet Take 1 tablet by mouth daily. 10/25/14  Yes Historical Provider, MD  metoprolol tartrate (LOPRESSOR) 25 MG tablet Take 1 tablet (25 mg total) by mouth 2 (two) times daily. 05/30/15  Yes Osvaldo Shipper, MD  mirtazapine (REMERON) 7.5 MG tablet Take 1 tablet by mouth at bedtime. 11/02/14  Yes Historical Provider, MD  Nutritional Supplements (ENSURE NUTRITION SHAKE) LIQD Take 1 Can by mouth 3 (three) times daily with meals. 05/30/15  Yes Osvaldo Shipper, MD  potassium chloride SA (K-DUR,KLOR-CON) 20 MEQ tablet Take 1 tablet (20 mEq total) by mouth daily. 05/31/15  Yes  Osvaldo Shipper, MD  predniSONE (DELTASONE) 20 MG tablet Take 1 tablet (20 mg total) by mouth daily with breakfast. 05/24/15  Yes Osvaldo Shipper, MD  rivaroxaban (XARELTO) 20 MG TABS tablet Take 1 tablet (20 mg total) by mouth daily with supper. PLEASE START ONLY AFTER ALL THE TABLETS IN THE STARTER  PACK HAVE BEEN TAKEN. 05/30/15  Yes Osvaldo Shipper, MD  cephALEXin (KEFLEX) 500 MG capsule Take 1 capsule (500 mg total) by mouth 2 (two) times daily. 08/09/15   Everlene Farrier, PA-C  Lactobacillus (ACIDOPHILUS PROBIOTIC) 10 MG TABS Take 10 mg by mouth 3 (three) times daily. 08/09/15   Everlene Farrier, PA-C   BP 177/118 mmHg  Pulse 96  Temp(Src) 98 F (36.7 C) (Oral)  Resp 22  Ht  (1.803 m)  Wt 47.628 kg  BMI 14.65 kg/m2  SpO2 100% Physical Exam  Constitutional: He appears well-developed and well-nourished. No distress.  Nontoxic appearing.  HENT:  Head: Normocephalic and atraumatic.  Mouth/Throat: Oropharynx is clear and moist.  Eyes: Conjunctivae are normal. Pupils are equal, round, and reactive to light. Right eye exhibits no discharge. Left eye exhibits no discharge.  Neck: Normal range of motion. Neck supple.  Cardiovascular: Normal rate, regular rhythm, normal heart sounds and intact distal pulses.  Exam reveals no gallop and no friction rub.   No murmur heard. Pulmonary/Chest: Effort normal and breath sounds normal. No respiratory distress. He has no wheezes. He has no rales.  Abdominal: Soft. Bowel sounds are normal. He exhibits no distension and no mass. There is no tenderness. There is no guarding.  Abdomen is soft and nontender to palpation. Bowel sounds are present.  Genitourinary: Guaiac positive stool.  Digital rectal exam performed by me with male nurse tech chaperone. No gross bloody stool. Anal sphincter tone is normal. Guaiac positive per Hemoccult card.  Musculoskeletal: He exhibits no edema.  Lymphadenopathy:    He has no cervical adenopathy.  Neurological: He is alert. Coordination normal.  Skin: Skin is warm and dry. No rash noted. He is not diaphoretic. No erythema. No pallor.  Psychiatric: He has a normal mood and affect. His behavior is normal.  Nursing note and vitals reviewed.   ED Course  Procedures (including critical care time) Labs  Review Labs Reviewed  CBC WITH DIFFERENTIAL/PLATELET - Abnormal; Notable for the following:    RBC 3.77 (*)    Hemoglobin 10.5 (*)    HCT 33.9 (*)    Platelets 508 (*)    Monocytes Absolute 1.6 (*)    All other components within normal limits  PROTIME-INR - Abnormal; Notable for the following:    Prothrombin Time 20.2 (*)    INR 1.73 (*)    All other components within normal limits  URINALYSIS, ROUTINE W REFLEX MICROSCOPIC (NOT AT [facility]) - Abnormal; Notable for the following:    Color, Urine AMBER (*)    APPearance CLOUDY (*)    Hgb urine dipstick TRACE (*)    Ketones, ur 40 (*)    Protein, ur 30 (*)    Nitrite POSITIVE (*)    Leukocytes, UA SMALL (*)    All other components within normal limits  URINE MICROSCOPIC-ADD ON - Abnormal; Notable for the following:    Squamous Epithelial / LPF 0-5 (*)    Bacteria, UA MANY (*)    Casts HYALINE CASTS (*)    All other components within normal limits  COMPREHENSIVE METABOLIC PANEL - Abnormal; Notable for the following:    Calcium 8.6 (*)  Albumin 3.1 (*)    ALT 13 (*)    All other components within normal limits  POC OCCULT BLOOD, ED - Abnormal; Notable for the following:    Fecal Occult Bld POSITIVE (*)    All other components within normal limits  URINE CULTURE  TYPE AND SCREEN  ABO/RH    Imaging Review No results found. I have personally reviewed and evaluated these lab results as part of my medical decision-making.   EKG Interpretation None      Filed Vitals:   08/09/15 1450 08/09/15 1843  BP: 153/95 177/118  Pulse: 85 96  Temp: 98.1 F (36.7 C) 98 F (36.7 C)  TempSrc: Oral Oral  Resp: 18 22  Height:  (1.803 m)   Weight: 47.628 kg   SpO2: 99% 100%     MDM   Meds given in ED:  Medications  sodium chloride 0.9 % bolus 1,000 mL (0 mLs Intravenous Stopped 08/09/15 1757)  cefTRIAXone (ROCEPHIN) 1 g in dextrose 5 % 50 mL IVPB (1 g Intravenous New Bag/Given 08/09/15 1759)    New Prescriptions    CEPHALEXIN (KEFLEX) 500 MG CAPSULE    Take 1 capsule (500 mg total) by mouth 2 (two) times daily.   LACTOBACILLUS (ACIDOPHILUS PROBIOTIC) 10 MG TABS    Take 10 mg by mouth 3 (three) times daily.    Final diagnoses:  UTI (lower urinary tract infection)  Bloody diarrhea   This is a 82 y.o. male with a history of hypertension, hyperlipidemia, dementia, and DVT under L2 presents to the emergency department complaining of bright red blood in his diarrhea for the past 2 days. Caregiver is at bedside who reports the patient has had bright red blood as diarrhea since yesterday. She reports 4-5 episodes of loose stool with bright red blood today. Patient denies any abdominal pain. Caregiver reports patient has had some increased urinary frequency over the past several days. She reports he has been eating and drinking well. Patient was seen by gastroenterologist Dr. Claretha Cooper on 04/2015 with similar bright red blood per rectum and was diagnosed with noninfectious colitis. On exam the patient is afebrile nontoxic appearing. His abdomen is soft and nontender to palpation. No gross bloody stool. Guaiac is positive. Patient is well-appearing. He has had no diarrhea during his emergency department stay. He is tolerating by mouth. CMP is unremarkable. CBC shows stable hemoglobin at 10.5 which is an improvement from his last test. Urinalysis shows nitrite positive urine with small leukocytes. Urine sent for culture. Will start the patient on Rocephin 1 g in the emergency department discharge with prescription for Keflex 500 mg twice a day and a probiotic. Patient has stable vital signs and is tolerating by mouth. He has had no diarrhea while in the emergency department. He is stable for discharge. Family is in agreement with discharge. I discussed strict return precautions with family. I advised the patient to follow-up with their primary care provider and GI this week. I advised the patient to return to the emergency  department with new or worsening symptoms or new concerns. The patient and his caregiver verbalized understanding and agreement with plan.    This patient was discussed with and evaluated by Dr. Donnald Garre who agrees with assessment and plan.   Everlene Farrier, PA-C 08/09/15 1902  Arby Barrette, MD 08/09/15 2056

## 2015-08-11 LAB — URINE CULTURE

## 2015-08-12 ENCOUNTER — Telehealth (HOSPITAL_BASED_OUTPATIENT_CLINIC_OR_DEPARTMENT_OTHER): Payer: Self-pay | Admitting: Emergency Medicine

## 2015-08-12 NOTE — Telephone Encounter (Signed)
Post ED Visit - Positive Culture Follow-up  Culture report reviewed by antimicrobial stewardship pharmacist:   Enzo Bi, Pharm.D.  Celedonio Miyamoto, Pharm.D., BCPS  Garvin Fila, Pharm.D.  Georgina Pillion, Pharm.D., BCPS  [place], [address].D., BCPS, AAHIVP  Estella Husk, Pharm.D., BCPS, AAHIVP  Tennis Must, Pharm.D.  Rob Oswaldo Done, [address].D.  Positive urine culture E. coli Treated with cephalexin, organism sensitive to the same and no further patient follow-up is required at this time.  Berle Mull 08/12/2015, 11:31 AM

## 2015-08-20 ENCOUNTER — Emergency Department (HOSPITAL_COMMUNITY)
Admission: EM | Admit: 2015-08-20 | Discharge: 2015-08-20 | Disposition: A | Discharge status: Home / Self Care | Payer: Medicare Other | Attending: Emergency Medicine | Admitting: Emergency Medicine

## 2015-08-20 ENCOUNTER — Encounter (HOSPITAL_COMMUNITY): Payer: Self-pay | Admitting: Emergency Medicine

## 2015-08-20 DIAGNOSIS — Z7902 Long term (current) use of antithrombotics/antiplatelets: Secondary | ICD-10-CM | POA: Insufficient documentation

## 2015-08-20 DIAGNOSIS — Z79899 Other long term (current) drug therapy: Secondary | ICD-10-CM | POA: Diagnosis not present

## 2015-08-20 DIAGNOSIS — E119 Type 2 diabetes mellitus without complications: Secondary | ICD-10-CM | POA: Diagnosis not present

## 2015-08-20 DIAGNOSIS — Z8639 Personal history of other endocrine, nutritional and metabolic disease: Secondary | ICD-10-CM | POA: Diagnosis not present

## 2015-08-20 DIAGNOSIS — R531 Weakness: Secondary | ICD-10-CM | POA: Diagnosis present

## 2015-08-20 DIAGNOSIS — G309 Alzheimer's disease, unspecified: Secondary | ICD-10-CM | POA: Insufficient documentation

## 2015-08-20 DIAGNOSIS — N401 Enlarged prostate with lower urinary tract symptoms: Secondary | ICD-10-CM | POA: Insufficient documentation

## 2015-08-20 DIAGNOSIS — F0280 Dementia in other diseases classified elsewhere without behavioral disturbance: Secondary | ICD-10-CM | POA: Diagnosis not present

## 2015-08-20 DIAGNOSIS — I10 Essential (primary) hypertension: Secondary | ICD-10-CM | POA: Diagnosis not present

## 2015-08-20 DIAGNOSIS — Z89611 Acquired absence of right leg above knee: Secondary | ICD-10-CM | POA: Diagnosis not present

## 2015-08-20 DIAGNOSIS — Z87891 Personal history of nicotine dependence: Secondary | ICD-10-CM | POA: Diagnosis not present

## 2015-08-20 DIAGNOSIS — R42 Dizziness and giddiness: Secondary | ICD-10-CM | POA: Insufficient documentation

## 2015-08-20 LAB — COMPREHENSIVE METABOLIC PANEL
ALBUMIN: 2.3 g/dL — AB (ref 3.5–5.0)
ALT: 13 U/L — ABNORMAL LOW (ref 17–63)
AST: 17 U/L (ref 15–41)
Alkaline Phosphatase: 66 U/L (ref 38–126)
Anion gap: 8 (ref 5–15)
BUN: 14 mg/dL (ref 6–20)
CHLORIDE: 108 mmol/L (ref 101–111)
CO2: 26 mmol/L (ref 22–32)
Calcium: 9 mg/dL (ref 8.9–10.3)
Creatinine, Ser: 1.19 mg/dL (ref 0.61–1.24)
GFR calc Af Amer: >60 mL/min (ref >60)
GFR, EST NON AFRICAN AMERICAN: 55 mL/min — AB (ref >60)
Glucose, Bld: 113 mg/dL — ABNORMAL HIGH (ref 65–99)
POTASSIUM: 4.1 mmol/L (ref 3.5–5.1)
Sodium: 142 mmol/L (ref 135–145)
Total Bilirubin: 0.1 mg/dL — ABNORMAL LOW (ref 0.3–1.2)
Total Protein: 6.5 g/dL (ref 6.5–8.1)

## 2015-08-20 LAB — CBC WITH DIFFERENTIAL/PLATELET
Basophils Absolute: 0 10*3/uL (ref 0.0–0.1)
Basophils Relative: 0 %
EOS PCT: 3 %
Eosinophils Absolute: 0.3 10*3/uL (ref 0.0–0.7)
HEMATOCRIT: 29.4 % — AB (ref 39.0–52.0)
HEMOGLOBIN: 9.5 g/dL — AB (ref 13.0–17.0)
LYMPHS ABS: 1.3 10*3/uL (ref 0.7–4.0)
LYMPHS PCT: 16 %
MCH: 27.8 pg (ref 26.0–34.0)
MCHC: 32.3 g/dL (ref 30.0–36.0)
MCV: 86 fL (ref 78.0–100.0)
Monocytes Absolute: 1.1 10*3/uL — ABNORMAL HIGH (ref 0.1–1.0)
Monocytes Relative: 14 %
NEUTROS PCT: 67 %
Neutro Abs: 5.4 10*3/uL (ref 1.7–7.7)
Platelets: 667 10*3/uL — ABNORMAL HIGH (ref 150–400)
RBC: 3.42 MIL/uL — AB (ref 4.22–5.81)
RDW: 15.3 % (ref 11.5–15.5)
WBC: 8 10*3/uL (ref 4.0–10.5)

## 2015-08-20 LAB — URINALYSIS, ROUTINE W REFLEX MICROSCOPIC
Bilirubin Urine: NEGATIVE
GLUCOSE, UA: NEGATIVE mg/dL
HGB URINE DIPSTICK: NEGATIVE
Ketones, ur: NEGATIVE mg/dL
Leukocytes, UA: NEGATIVE
Nitrite: NEGATIVE
PH: 6 (ref 5.0–8.0)
Protein, ur: NEGATIVE mg/dL
SPECIFIC GRAVITY, URINE: 1.021 (ref 1.005–1.030)

## 2015-08-20 NOTE — Discharge Instructions (Signed)
As discussed, your evaluation today has been largely reassuring.  But, it is important that you monitor your condition carefully, and do not hesitate to return to the ED if you develop new, or concerning changes in your condition. ? ?Otherwise, please follow-up with your physician for appropriate ongoing care. ? ?

## 2015-08-20 NOTE — ED Notes (Signed)
Pt reported weakness as he got up to use the restroom, EMS reports that upon arrival he was "slightly lethargic" however is now "normal"  Was admitted to hospital 2 weeks ago for UTI, no complaints now.  Hx of above the knee amputation right leg, dementia.  Denies n/v/d.  Reports he feels "normal now."

## 2015-08-20 NOTE — ED Provider Notes (Signed)
CSN: [id number]     Arrival date & time 08/20/15  2033 History   First MD Initiated Contact with Patient 08/20/15 2034     Chief Complaint  Patient presents with  . Weakness     (Consider location/radiation/quality/duration/timing/severity/associated sxs/prior Treatment) HPI Patient presents after an episode of weakness. Patient states about one hour prior to ED arrival, he was getting up to use the bathroom.  When he felt weak, slightly lightheaded, but did not fall, did not lose consciousness, had no pain. EMS reports that the patient was slightly listless on their arrival, but normalized in route. Patient currently denies any complaints. He acknowledges multiple medical problems, but does have history of dementia, which interferes with the history of present illness.  Past Medical History  Diagnosis Date  . Hypertension   . Hyperlipidemia   . Alzheimer's disease   . HTN (hypertension) 11/21/2014  . Dementia 11/21/2014  . BPH (benign prostatic hyperplasia) 11/21/2014   Past Surgical History  Procedure Laterality Date  . Colonoscopy N/A 05/19/2015    Procedure: COLONOSCOPY;  Surgeon: Vida Rigger, MD;  Location: [facility] ENDOSCOPY;  Service: Endoscopy;  Laterality: N/A;  . Amputation Right 05/26/2015    Procedure: RIGHT ABOVE THE KNEE  AMPUTATION ;  Surgeon: Chuck Hint, MD;  Location: [facility] OR;  Service: Vascular;  Laterality: Right;   No family history on file. Social History  Substance Use Topics  . Smoking status: Former Smoker    Quit date: 06/29/2005  . Smokeless tobacco: Never Used  . Alcohol Use: No    Review of Systems  Unable to perform ROS: Dementia  Constitutional:       Per HPI, otherwise negative  HENT:       Per HPI, otherwise negative  Respiratory:       Per HPI, otherwise negative  Cardiovascular:       Per HPI, otherwise negative  Gastrointestinal: Negative for vomiting.  Endocrine:       Negative aside from HPI  Genitourinary:       Neg aside from  HPI   Musculoskeletal:       Per HPI, otherwise negative  Skin: Negative for pallor.  Allergic/Immunologic: Positive for immunocompromised state.       Diabetes  Neurological: Negative for syncope.      Allergies  Review of patient's allergies indicates no known allergies.  Home Medications   Prior to Admission medications   Medication Sig Start Date End Date Taking? Authorizing Provider  acetaminophen (TYLENOL) 325 MG tablet Take 1-2 tablets (325-650 mg total) by mouth every 4 (four) hours as needed for mild pain or fever. 05/30/15  Yes Osvaldo Shipper, MD  donepezil (ARICEPT) 5 MG tablet Take 5 mg by mouth at bedtime.  10/25/14  Yes Historical Provider, MD  finasteride (PROSCAR) 5 MG tablet Take 5 mg by mouth daily.  10/25/14  Yes Historical Provider, MD  metoprolol tartrate (LOPRESSOR) 25 MG tablet Take 1 tablet (25 mg total) by mouth 2 (two) times daily. 05/30/15  Yes Osvaldo Shipper, MD  mirtazapine (REMERON) 7.5 MG tablet Take 7.5 mg by mouth at bedtime.  11/02/14  Yes Historical Provider, MD  Nutritional Supplements (ENSURE NUTRITION SHAKE) LIQD Take 1 Can by mouth 3 (three) times daily with meals. 05/30/15  Yes Osvaldo Shipper, MD  potassium chloride SA (K-DUR,KLOR-CON) 20 MEQ tablet Take 1 tablet (20 mEq total) by mouth daily. 05/31/15  Yes Osvaldo Shipper, MD  rivaroxaban (XARELTO) 20 MG TABS tablet Take 1 tablet (20 mg  total) by mouth daily with supper. PLEASE START ONLY AFTER ALL THE TABLETS IN THE STARTER PACK HAVE BEEN TAKEN. Patient taking differently: Take 20 mg by mouth daily with lunch.  05/30/15  Yes Osvaldo Shipper, MD  Lactobacillus (ACIDOPHILUS PROBIOTIC) 10 MG TABS Take 10 mg by mouth 3 (three) times daily. Patient not taking: Reported on 08/20/2015 08/09/15   Everlene Farrier, PA-C   BP 110/75 mmHg  Pulse 80  Resp 18  SpO2 98% Physical Exam  Constitutional: He appears well-developed. No distress.  HENT:  Head: Normocephalic and atraumatic.  Eyes: Conjunctivae and EOM are  normal.  Cardiovascular: Normal rate and regular rhythm.   Pulmonary/Chest: Effort normal. No stridor. No respiratory distress.  Abdominal: He exhibits no distension.  Musculoskeletal: He exhibits no edema.  Right above-the-knee amputation.  Neurological: He is alert. No cranial nerve deficit. He exhibits normal muscle tone. Coordination normal.  Skin: Skin is warm and dry.  Psychiatric: He has a normal mood and affect. He is slowed. Cognition and memory are impaired.  Nursing note and vitals reviewed.   ED Course  Procedures (including critical care time) Labs Review Labs Reviewed  COMPREHENSIVE METABOLIC PANEL  CBC WITH DIFFERENTIAL/PLATELET  URINALYSIS, ROUTINE W REFLEX MICROSCOPIC (NOT AT [facility])    Review of patient's EMR notable for recent evaluation, discharge.  On repeat exam the patient is in no distress per He remains hemodynamically stable.  MDM  Elderly male with dementia presents after an episode of weakness, that was transient. Here, the patient has reassuring labs, vital signs, and is in no distress, with no new complaints for hours of monitoring in the emergency department. Patient discharged in stable condition to follow-up with primary care.  Gerhard Munch, MD 08/20/15 2308

## 2015-08-20 NOTE — ED Notes (Signed)
Pt reports he felt weak when he was trying to perform ADLS earlier. Pt reports recent treatment for UTI. Pt does have dementia so some questions he cannot answer.

## 2015-08-31 ENCOUNTER — Inpatient Hospital Stay (HOSPITAL_COMMUNITY)
Admission: EM | Admit: 2015-08-31 | Discharge: 2015-09-05 | DRG: 377 | Disposition: A | Discharge status: Skilled Nursing Facility | Payer: Medicare Other | Attending: Internal Medicine | Admitting: Internal Medicine

## 2015-08-31 ENCOUNTER — Encounter (HOSPITAL_COMMUNITY): Payer: Self-pay

## 2015-08-31 ENCOUNTER — Inpatient Hospital Stay (HOSPITAL_COMMUNITY): Payer: Medicare Other

## 2015-08-31 ENCOUNTER — Emergency Department (HOSPITAL_COMMUNITY): Payer: Medicare Other

## 2015-08-31 DIAGNOSIS — K5790 Diverticulosis of intestine, part unspecified, without perforation or abscess without bleeding: Secondary | ICD-10-CM | POA: Diagnosis present

## 2015-08-31 DIAGNOSIS — B9620 Unspecified Escherichia coli [E. coli] as the cause of diseases classified elsewhere: Secondary | ICD-10-CM | POA: Diagnosis present

## 2015-08-31 DIAGNOSIS — K625 Hemorrhage of anus and rectum: Secondary | ICD-10-CM

## 2015-08-31 DIAGNOSIS — G309 Alzheimer's disease, unspecified: Secondary | ICD-10-CM | POA: Diagnosis present

## 2015-08-31 DIAGNOSIS — R52 Pain, unspecified: Secondary | ICD-10-CM | POA: Diagnosis not present

## 2015-08-31 DIAGNOSIS — Z79899 Other long term (current) drug therapy: Secondary | ICD-10-CM | POA: Diagnosis not present

## 2015-08-31 DIAGNOSIS — I82A11 Acute embolism and thrombosis of right axillary vein: Secondary | ICD-10-CM | POA: Diagnosis present

## 2015-08-31 DIAGNOSIS — Z7901 Long term (current) use of anticoagulants: Secondary | ICD-10-CM

## 2015-08-31 DIAGNOSIS — Z87891 Personal history of nicotine dependence: Secondary | ICD-10-CM | POA: Diagnosis not present

## 2015-08-31 DIAGNOSIS — Z89611 Acquired absence of right leg above knee: Secondary | ICD-10-CM

## 2015-08-31 DIAGNOSIS — E43 Unspecified severe protein-calorie malnutrition: Secondary | ICD-10-CM | POA: Diagnosis present

## 2015-08-31 DIAGNOSIS — I10 Essential (primary) hypertension: Secondary | ICD-10-CM

## 2015-08-31 DIAGNOSIS — F0280 Dementia in other diseases classified elsewhere without behavioral disturbance: Secondary | ICD-10-CM | POA: Diagnosis present

## 2015-08-31 DIAGNOSIS — Z66 Do not resuscitate: Secondary | ICD-10-CM | POA: Diagnosis present

## 2015-08-31 DIAGNOSIS — K922 Gastrointestinal hemorrhage, unspecified: Principal | ICD-10-CM | POA: Diagnosis present

## 2015-08-31 DIAGNOSIS — N390 Urinary tract infection, site not specified: Secondary | ICD-10-CM | POA: Diagnosis present

## 2015-08-31 DIAGNOSIS — E785 Hyperlipidemia, unspecified: Secondary | ICD-10-CM | POA: Diagnosis present

## 2015-08-31 DIAGNOSIS — D72829 Elevated white blood cell count, unspecified: Secondary | ICD-10-CM | POA: Diagnosis present

## 2015-08-31 DIAGNOSIS — G9340 Encephalopathy, unspecified: Secondary | ICD-10-CM | POA: Diagnosis not present

## 2015-08-31 DIAGNOSIS — K551 Chronic vascular disorders of intestine: Secondary | ICD-10-CM | POA: Diagnosis present

## 2015-08-31 DIAGNOSIS — N400 Enlarged prostate without lower urinary tract symptoms: Secondary | ICD-10-CM | POA: Diagnosis present

## 2015-08-31 DIAGNOSIS — E870 Hyperosmolality and hypernatremia: Secondary | ICD-10-CM | POA: Diagnosis not present

## 2015-08-31 DIAGNOSIS — Z86718 Personal history of other venous thrombosis and embolism: Secondary | ICD-10-CM

## 2015-08-31 DIAGNOSIS — E860 Dehydration: Secondary | ICD-10-CM | POA: Diagnosis present

## 2015-08-31 DIAGNOSIS — R4781 Slurred speech: Secondary | ICD-10-CM

## 2015-08-31 DIAGNOSIS — R4182 Altered mental status, unspecified: Secondary | ICD-10-CM

## 2015-08-31 DIAGNOSIS — I808 Phlebitis and thrombophlebitis of other sites: Secondary | ICD-10-CM | POA: Diagnosis not present

## 2015-08-31 DIAGNOSIS — I119 Hypertensive heart disease without heart failure: Secondary | ICD-10-CM | POA: Diagnosis present

## 2015-08-31 DIAGNOSIS — R7881 Bacteremia: Secondary | ICD-10-CM | POA: Diagnosis present

## 2015-08-31 DIAGNOSIS — R109 Unspecified abdominal pain: Secondary | ICD-10-CM

## 2015-08-31 LAB — CBC WITH DIFFERENTIAL/PLATELET
BASOS ABS: 0 10*3/uL (ref 0.0–0.1)
BASOS PCT: 0 %
EOS ABS: 0.5 10*3/uL (ref 0.0–0.7)
Eosinophils Relative: 2 %
HCT: 31.8 % — ABNORMAL LOW (ref 39.0–52.0)
Hemoglobin: 10.2 g/dL — ABNORMAL LOW (ref 13.0–17.0)
Lymphocytes Relative: 7 %
Lymphs Abs: 1.8 10*3/uL (ref 0.7–4.0)
MCH: 27.7 pg (ref 26.0–34.0)
MCHC: 32.1 g/dL (ref 30.0–36.0)
MCV: 86.4 fL (ref 78.0–100.0)
MONO ABS: 2.3 10*3/uL — AB (ref 0.1–1.0)
Monocytes Relative: 9 %
NEUTROS ABS: 20.4 10*3/uL — AB (ref 1.7–7.7)
Neutrophils Relative %: 82 %
PLATELETS: 407 10*3/uL — AB (ref 150–400)
RBC: 3.68 MIL/uL — ABNORMAL LOW (ref 4.22–5.81)
RDW: 16 % — AB (ref 11.5–15.5)
WBC: 25 10*3/uL — ABNORMAL HIGH (ref 4.0–10.5)

## 2015-08-31 LAB — BASIC METABOLIC PANEL
Anion gap: 10 (ref 5–15)
BUN: 22 mg/dL — AB (ref 6–20)
CALCIUM: 8.9 mg/dL (ref 8.9–10.3)
CO2: 25 mmol/L (ref 22–32)
CREATININE: 1.44 mg/dL — AB (ref 0.61–1.24)
Chloride: 113 mmol/L — ABNORMAL HIGH (ref 101–111)
GFR calc Af Amer: 51 mL/min — ABNORMAL LOW (ref >60)
GFR, EST NON AFRICAN AMERICAN: 44 mL/min — AB (ref >60)
GLUCOSE: 87 mg/dL (ref 65–99)
POTASSIUM: 4.8 mmol/L (ref 3.5–5.1)
Sodium: 148 mmol/L — ABNORMAL HIGH (ref 135–145)

## 2015-08-31 LAB — URINALYSIS, ROUTINE W REFLEX MICROSCOPIC
BILIRUBIN URINE: NEGATIVE
GLUCOSE, UA: NEGATIVE mg/dL
Ketones, ur: NEGATIVE mg/dL
NITRITE: POSITIVE — AB
PH: 6 (ref 5.0–8.0)
Protein, ur: 30 mg/dL — AB
SPECIFIC GRAVITY, URINE: 1.015 (ref 1.005–1.030)

## 2015-08-31 LAB — PROTIME-INR
INR: 1.05 (ref 0.00–1.49)
Prothrombin Time: 13.9 seconds (ref 11.6–15.2)

## 2015-08-31 LAB — AMMONIA: Ammonia: 25 umol/L (ref 9–35)

## 2015-08-31 LAB — HEMOGLOBIN AND HEMATOCRIT, BLOOD
HEMATOCRIT: 30 % — AB (ref 39.0–52.0)
HEMOGLOBIN: 9.6 g/dL — AB (ref 13.0–17.0)

## 2015-08-31 LAB — POC OCCULT BLOOD, ED: FECAL OCCULT BLD: POSITIVE — AB

## 2015-08-31 LAB — I-STAT CG4 LACTIC ACID, ED: LACTIC ACID, VENOUS: 1.23 mmol/L (ref 0.5–2.0)

## 2015-08-31 LAB — URINE MICROSCOPIC-ADD ON

## 2015-08-31 LAB — I-STAT TROPONIN, ED: Troponin i, poc: 0.02 ng/mL (ref 0.00–0.08)

## 2015-08-31 MED ORDER — MIRTAZAPINE 15 MG PO TABS
7.5000 mg | ORAL_TABLET | Freq: Every day | ORAL | Status: DC
  Administered 2015-09-01 – 2015-09-04 (×4): 7.5 mg via ORAL
  Filled 2015-08-31 (×4): qty 1

## 2015-08-31 MED ORDER — SODIUM CHLORIDE 0.9 % IV SOLN
INTRAVENOUS | Status: DC
  Administered 2015-08-31 – 2015-09-01 (×2): via INTRAVENOUS
  Administered 2015-09-02: 1000 mL via INTRAVENOUS

## 2015-08-31 MED ORDER — DONEPEZIL HCL 5 MG PO TABS
5.0000 mg | ORAL_TABLET | Freq: Every day | ORAL | Status: DC
Start: 2015-08-31 — End: 2015-09-05
  Administered 2015-09-01 – 2015-09-04 (×4): 5 mg via ORAL
  Filled 2015-08-31 (×4): qty 1

## 2015-08-31 MED ORDER — FINASTERIDE 5 MG PO TABS
5.0000 mg | ORAL_TABLET | Freq: Every day | ORAL | Status: DC
Start: 2015-08-31 — End: 2015-09-05
  Administered 2015-09-02 – 2015-09-05 (×4): 5 mg via ORAL
  Filled 2015-08-31 (×5): qty 1

## 2015-08-31 MED ORDER — SODIUM CHLORIDE 0.9% FLUSH
3.0000 mL | Freq: Two times a day (BID) | INTRAVENOUS | Status: DC
  Administered 2015-08-31 – 2015-09-04 (×7): 3 mL via INTRAVENOUS

## 2015-08-31 MED ORDER — SODIUM CHLORIDE 0.9 % IV BOLUS (SEPSIS)
500.0000 mL | Freq: Once | INTRAVENOUS | Status: AC
  Administered 2015-08-31: 500 mL via INTRAVENOUS

## 2015-08-31 MED ORDER — METRONIDAZOLE IN NACL 5-0.79 MG/ML-% IV SOLN
500.0000 mg | Freq: Three times a day (TID) | INTRAVENOUS | Status: DC
  Administered 2015-08-31 – 2015-09-05 (×15): 500 mg via INTRAVENOUS
  Filled 2015-08-31 (×15): qty 100

## 2015-08-31 MED ORDER — SACCHAROMYCES BOULARDII 250 MG PO CAPS
250.0000 mg | ORAL_CAPSULE | Freq: Three times a day (TID) | ORAL | Status: DC
  Administered 2015-09-01 – 2015-09-05 (×10): 250 mg via ORAL
  Filled 2015-08-31 (×11): qty 1

## 2015-08-31 MED ORDER — IOHEXOL 300 MG/ML  SOLN
75.0000 mL | Freq: Once | INTRAMUSCULAR | Status: AC | PRN
  Administered 2015-08-31: 100 mL via INTRAVENOUS

## 2015-08-31 MED ORDER — ACETAMINOPHEN 325 MG PO TABS
325.0000 mg | ORAL_TABLET | ORAL | Status: DC | PRN
  Administered 2015-09-02: 650 mg via ORAL
  Filled 2015-08-31: qty 2

## 2015-08-31 MED ORDER — CEFTRIAXONE SODIUM 1 G IJ SOLR
1.0000 g | Freq: Once | INTRAMUSCULAR | Status: AC
  Administered 2015-08-31: 1 g via INTRAVENOUS
  Filled 2015-08-31: qty 10

## 2015-08-31 MED ORDER — METRONIDAZOLE 500 MG PO TABS: 500.0000 mg | ORAL_TABLET | Freq: Three times a day (TID) | ORAL | Status: DC

## 2015-08-31 MED ORDER — ALBUTEROL SULFATE (2.5 MG/3ML) 0.083% IN NEBU: 2.5000 mg | INHALATION_SOLUTION | RESPIRATORY_TRACT | Status: DC | PRN

## 2015-08-31 MED ORDER — PANTOPRAZOLE SODIUM 40 MG IV SOLR
40.0000 mg | Freq: Two times a day (BID) | INTRAVENOUS | Status: DC
  Administered 2015-08-31 – 2015-09-05 (×10): 40 mg via INTRAVENOUS
  Filled 2015-08-31 (×11): qty 40

## 2015-08-31 MED ORDER — ENSURE ENLIVE PO LIQD
237.0000 mL | Freq: Three times a day (TID) | ORAL | Status: DC
Start: 2015-08-31 — End: 2015-09-05
  Administered 2015-09-02 – 2015-09-05 (×7): 237 mL via ORAL

## 2015-08-31 MED ORDER — SODIUM CHLORIDE 0.9 % IV SOLN: INTRAVENOUS | Status: DC

## 2015-08-31 MED ORDER — CIPROFLOXACIN IN D5W 400 MG/200ML IV SOLN
400.0000 mg | Freq: Two times a day (BID) | INTRAVENOUS | Status: DC
  Administered 2015-08-31 – 2015-09-05 (×10): 400 mg via INTRAVENOUS
  Filled 2015-08-31 (×10): qty 200

## 2015-08-31 NOTE — ED Notes (Signed)
Pt being transported upstairs by Debbie, NT.  

## 2015-08-31 NOTE — H&P (Signed)
Patient Demographics  Billy [name], is a 82 y.o. male  MRN: [id number]   DOB - Jan 27, 1932  Admit Date - 08/31/2015  Outpatient Primary MD for the patient is [name], MD   With History of -  Past Medical History  Diagnosis Date  . Hypertension   . Hyperlipidemia   . Alzheimer's disease   . HTN (hypertension) 11/21/2014  . Dementia 11/21/2014  . BPH (benign prostatic hyperplasia) 11/21/2014      Past Surgical History  Procedure Laterality Date  . Colonoscopy N/A 05/19/2015    Procedure: COLONOSCOPY;  Surgeon: Vida Rigger, MD;  Location: [facility] ENDOSCOPY;  Service: Endoscopy;  Laterality: N/A;  . Amputation Right 05/26/2015    Procedure: RIGHT ABOVE THE KNEE  AMPUTATION ;  Surgeon: Chuck Hint, MD;  Location: [facility] OR;  Service: Vascular;  Laterality: Right;    in for   Chief Complaint  Patient presents with  . Altered Mental Status     HPI  Billy [name]  is a 82 y.o. male, with a past medical history of dementia, diverticulosis, hypertension, right ischemic foot status post AKA, with admission late last year for lower GI bleed, workup significant for polyps, diverticulosis and chronic ischemic colitis, presents with complaints of altered mental status, bright red blood per rectum, knees and caregiver at bedside giving history, patient has been more altered, with decreased appetite over last 24 hours, no focal deficits noticed, as well the report bright red blood per rectum over last 24 hours as well, patient is afebrile in ED, hemoglobin at baseline, with elevated creatinine, and leukocytosis, UA still pending, CT abdomen and pelvis pending, hospitalist called to admit.  Review of Systems    In addition to the HPI above,   Patient is very poor historian secondary to dementia, can't give any reliable review of systems. A full 10 point Review of Systems was done, except as stated above, all other Review of Systems were negative.   Social History Social  History  Substance Use Topics  . Smoking status: Former Smoker    Quit date: 06/29/2005  . Smokeless tobacco: Never Used  . Alcohol Use: No     Family History Was reviewed, noncontributory  Prior to Admission medications   Medication Sig Start Date End Date Taking? Authorizing Provider  acetaminophen (TYLENOL) 325 MG tablet Take 1-2 tablets (325-650 mg total) by mouth every 4 (four) hours as needed for mild pain or fever. 05/30/15  Yes Osvaldo Shipper, MD  donepezil (ARICEPT) 5 MG tablet Take 5 mg by mouth at bedtime.  10/25/14  Yes Historical Provider, MD  finasteride (PROSCAR) 5 MG tablet Take 5 mg by mouth daily.  10/25/14  Yes Historical Provider, MD  Lactobacillus (ACIDOPHILUS PROBIOTIC) 10 MG TABS Take 10 mg by mouth 3 (three) times daily. 08/09/15  Yes Everlene Farrier, PA-C  metoprolol tartrate (LOPRESSOR) 25 MG tablet Take 1 tablet (25 mg total) by mouth 2 (two) times daily. 05/30/15  Yes Osvaldo Shipper, MD  mirtazapine (REMERON) 7.5 MG tablet Take 7.5 mg by mouth at bedtime.  11/02/14  Yes Historical Provider, MD  Nutritional Supplements (ENSURE NUTRITION SHAKE) LIQD Take 1 Can by mouth 3 (three) times daily with meals. 05/30/15  Yes Osvaldo Shipper, MD  potassium chloride SA (K-DUR,KLOR-CON) 20 MEQ tablet Take 1 tablet (20 mEq total) by mouth daily. 05/31/15  Yes Osvaldo Shipper, MD  rivaroxaban (XARELTO) 20 MG TABS tablet Take 1 tablet (20 mg total) by mouth daily with supper. PLEASE START  ONLY AFTER ALL THE TABLETS IN THE STARTER PACK HAVE BEEN TAKEN. Patient taking differently: Take 20 mg by mouth daily with lunch.  05/30/15  Yes Osvaldo Shipper, MD    No Known Allergies  Physical Exam  Vitals  Blood pressure 119/75, pulse 88, temperature 97.2 F (36.2 C), temperature source Rectal, resp. rate 14, SpO2 99 %.   1. General  frail, elderly thin-appearing malelying in bed in NAD,   2. Normal affect and insight, Not Suicidal or Homicidal, Awake Alert,  Confused  3. No F.N deficits,  ALL C.Nerves Intact, Strength 5/5, right AKA.  4. Ears and Eyes appear Normal, Conjunctivae clear, PERRLA.  Dry Oral Mucosa.  5. Supple Neck, No JVD, No cervical lymphadenopathy appriciated, No Carotid Bruits.  6. Symmetrical Chest wall movement, Good air movement bilaterally, CTAB.  7. RRR, No Gallops, Rubs or Murmurs, No Parasternal Heave.  8. Positive Bowel Sounds, Abdomen Soft, No tenderness, No organomegaly appriciated,No rebound -guarding or rigidity.  9.  No Cyanosis, Normal Skin Turgor, No Skin Rash or Bruise.  10. Significant muscle wasting, right AKA.  joints appear normal ,     Data Review  CBC  Recent Labs Lab 08/31/15 1016  WBC 25.0*  HGB 10.2*  HCT 31.8*  PLT 407*  MCV 86.4  MCH 27.7  MCHC 32.1  RDW 16.0*  LYMPHSABS 1.8  MONOABS 2.3*  EOSABS 0.5  BASOSABS 0.0   ------------------------------------------------------------------------------------------------------------------  Chemistries   Recent Labs Lab 08/31/15 1016  NA 148*  K 4.8  CL 113*  CO2 25  GLUCOSE 87  BUN 22*  CREATININE 1.44*  CALCIUM 8.9   ------------------------------------------------------------------------------------------------------------------ CrCl cannot be calculated (Unknown ideal weight.). ------------------------------------------------------------------------------------------------------------------ No results for input(s): TSH, T4TOTAL, T3FREE, THYROIDAB in the last 72 hours.  Invalid input(s): FREET3   Coagulation profile  Recent Labs Lab 08/31/15 1016  INR 1.05   ------------------------------------------------------------------------------------------------------------------- No results for input(s): DDIMER in the last 72 hours. -------------------------------------------------------------------------------------------------------------------  Cardiac Enzymes No results for input(s): CKMB, TROPONINI, MYOGLOBIN in the last 168 hours.  Invalid  input(s): CK ------------------------------------------------------------------------------------------------------------------ Invalid input(s): POCBNP   ---------------------------------------------------------------------------------------------------------------  Urinalysis    Component Value Date/Time   COLORURINE YELLOW 08/31/2015 1055   APPEARANCEUR CLOUDY* 08/31/2015 1055   LABSPEC 1.015 08/31/2015 1055   PHURINE 6.0 08/31/2015 1055   GLUCOSEU NEGATIVE 08/31/2015 1055   HGBUR SMALL* 08/31/2015 1055   BILIRUBINUR NEGATIVE 08/31/2015 1055   KETONESUR NEGATIVE 08/31/2015 1055   PROTEINUR 30* 08/31/2015 1055   UROBILINOGEN 0.2 11/21/2014 1555   NITRITE POSITIVE* 08/31/2015 1055   LEUKOCYTESUR MODERATE* 08/31/2015 1055    ----------------------------------------------------------------------------------------------------------------  Imaging results:   Ct Head Wo Contrast  08/31/2015  CLINICAL DATA:  Altered mental status EXAM: CT HEAD WITHOUT CONTRAST TECHNIQUE: Contiguous axial images were obtained from the base of the skull through the vertex without intravenous contrast. COMPARISON:  05/21/2015 FINDINGS: There is no evidence of mass effect, midline shift, or extra-axial fluid collections. There is no evidence of a space-occupying lesion or intracranial hemorrhage. There is no evidence of a cortical-based area of acute infarction. There is generalized cerebral atrophy. There is periventricular white matter low attenuation likely secondary to microangiopathy. The ventricles and sulci are appropriate for the patient's age. The basal cisterns are patent. Visualized portions of the orbits are unremarkable. The mastoid sinuses are clear. There is an air-fluid level in the right maxillary sinus. The osseous structures are unremarkable. IMPRESSION: 1.  No acute intracranial pathology. 2. Chronic microvascular disease and cerebral atrophy. Electronically Signed  By: Elige Ko   On:  08/31/2015 11:22   Dg Chest Port 1 View  08/31/2015  CLINICAL DATA:  Altered mental status.  Dementia.  Hypertension. EXAM: PORTABLE CHEST 1 VIEW COMPARISON:  11/21/2014 FINDINGS: Midline trachea. Patient rotated left. Mild cardiomegaly. No pleural effusion or pneumothorax. Clear lungs. IMPRESSION: Mild cardiomegaly, without acute disease. Electronically Signed   By: Jeronimo Greaves M.D.   On: 08/31/2015 10:32       Assessment & Plan  Active Problems:   GI bleed   Leukocytosis   HTN (hypertension)   Protein-calorie malnutrition, severe (HCC)   DVT of right axillary vein, acute   BRBPR (bright red blood per rectum)   Acute encephalopathy   Acute encephalopathy: - Patient with baseline dementia, mental status, CT head with no acute finding, possibly dehydration and infectious process contributing to his acute encephalopathy.  Bright red blood per rectum - Hemoglobin remained stable, recent colonoscopy significant for diverticulosis, polyps, and chronic colitis. - Monitor H&H every 8 hours, transfuse as needed, requested GI input, CT abdomen and pelvis pending, lower GI bleed as bright blood rectum, will continue with Protonix twice a day.  Leukocytosis - Afebrile, normal lactic acid, will follow CT abdomen and pelvis, will check blood cultures, urinalysis is negative, no acute process in chest x-ray.  History of DVT - Continue to hold Xarelto, as per family it should be stopped in 30 days from now.  Protein calorie malnutrition - We'll start on supplemental when  more stable  Hypertension - Continue to hold meds as blood pressure was soft.       DVT Prophylaxis SCDs  AM Labs Ordered, also please review Full Orders  Family Communication: Admission, patients condition and plan of care including tests being ordered have been discussed with the patient and niecewho indicate understanding and agree with the plan and Code Status.  Code Status DNR(confirmed by his  niece)  Condition GUARDED    Time spent in minutes : 60 minutes    ELGERGAWY, DAWOOD M.D on 08/31/2015 at 12:30 PM  Between 7am to 7pm - Pager - [phone]  After 7pm go to [email or web address] - password [email or web address]  And look for the night coverage person covering me after hours  [facility] Office  [phone]

## 2015-08-31 NOTE — ED Notes (Signed)
Per EMS, Pt was brought in by EMS. Per wife, Pt was last seen normal last night at 1700. Pt was moaning all last night and this morning, pt was moaning and nonverbal with wife and EMS. Pt has slight left sided droop with no notable motor weakness. Pt is tearful and moaning. Denies pain. Pt has Hx of DVT with AKA on the right. Pt was recently seen for GI Bleed and has notable blood in brief. Vitals Per EMS: 148/88, 98%, 96 HR, 102 CBG

## 2015-08-31 NOTE — ED Notes (Signed)
Phlebotomy at the bedside  

## 2015-08-31 NOTE — Evaluation (Signed)
Clinical/Bedside Swallow Evaluation Patient Details  Name: Billy [name] MRN: [id number] Date of Birth: [birth year]  Today's Date: 08/31/2015 Time: SLP Start Time (ACUTE ONLY): 1441 SLP Stop Time (ACUTE ONLY): 1454 SLP Time Calculation (min) (ACUTE ONLY): 13 min  Past Medical History:  Past Medical History  Diagnosis Date  . Hypertension   . Hyperlipidemia   . Alzheimer's disease   . HTN (hypertension) 11/21/2014  . Dementia 11/21/2014  . BPH (benign prostatic hyperplasia) 11/21/2014   Past Surgical History:  Past Surgical History  Procedure Laterality Date  . Colonoscopy N/A 05/19/2015    Procedure: COLONOSCOPY;  Surgeon: Vida Rigger, MD;  Location: [facility] ENDOSCOPY;  Service: Endoscopy;  Laterality: N/A;  . Amputation Right 05/26/2015    Procedure: RIGHT ABOVE THE KNEE  AMPUTATION ;  Surgeon: Chuck Hint, MD;  Location: [facility] OR;  Service: Vascular;  Laterality: Right;   HPI:  82 y.o. male, with a past medical history of dementia, diverticulosis, hypertension, right ischemic foot status post AKA, with admission late last year for lower GI bleed, diverticulosis and chronic ischemic colitis, presents with complaints of altered mental status, bright red blood per rectum, knees and caregiver at bedside giving history, patient has been more altered, with decreased appetite over last 24 hours. CT No acute intracranial pathology. CXR Mild cardiomegaly, without acute disease.   Assessment / Plan / Recommendation Clinical Impression  Pt lethargic upon SLP arrival and difficult to arouse with eyes remaining shut throughout session. Pt exhibited poor awareness of bolus resulting in oral holding, as well as suspected delayed swallow, decreased hyolaryngeal movement and multiple swallows. No throat clearing or coughing observed after these s/s of possible dysphagia, indicative of possible sensorimotor deficit. Recommend NPO (except vital meds crushed in applesauce) until next date when SLP will f/u  to determine PO readiness and/or possible instrumental testing. D/w nurse findings of BSE and treatment plan.    Aspiration Risk  Moderate aspiration risk    Diet Recommendation NPO except meds   Medication Administration: Crushed with puree Postural Changes: Seated upright at 90 degrees    Other  Recommendations Oral Care Recommendations: Oral care BID   Follow up Recommendations   (TBD)    Frequency and Duration min 2x/week  2 weeks       Prognosis Prognosis for Safe Diet Advancement: Fair Barriers to Reach Goals: Cognitive deficits;Severity of deficits      Swallow Study   General HPI: 82 y.o. male, with a past medical history of dementia, diverticulosis, hypertension, right ischemic foot status post AKA, with admission late last year for lower GI bleed, diverticulosis and chronic ischemic colitis, presents with complaints of altered mental status, bright red blood per rectum, knees and caregiver at bedside giving history, patient has been more altered, with decreased appetite over last 24 hours. CT No acute intracranial pathology. CXR Mild cardiomegaly, without acute disease. Type of Study: Bedside Swallow Evaluation Previous Swallow Assessment: none found Diet Prior to this Study: NPO Temperature Spikes Noted: No Respiratory Status: Room air History of Recent Intubation: No Behavior/Cognition: Lethargic/Drowsy;Requires cueing;Doesn't follow directions Oral Cavity Assessment: Within Functional Limits Oral Care Completed by SLP: No Oral Cavity - Dentition: Other (Comment) (will assess further) Self-Feeding Abilities: Total assist Patient Positioning: Upright in bed Baseline Vocal Quality: Normal;Low vocal intensity Volitional Cough: Cognitively unable to elicit Volitional Swallow: Unable to elicit    Oral/Motor/Sensory Function Overall Oral Motor/Sensory Function: Other (comment) (unable to assess due to lethargy)   Ice Chips Ice chips: Impaired Presentation:  Spoon Oral Phase Impairments: Poor awareness of bolus Oral Phase Functional Implications: Oral holding Pharyngeal Phase Impairments: Suspected delayed Swallow;Decreased hyoid-laryngeal movement;Multiple swallows   Thin Liquid Thin Liquid: Impaired Presentation: Spoon;Straw Oral Phase Impairments: Poor awareness of bolus Pharyngeal  Phase Impairments: Suspected delayed Swallow;Decreased hyoid-laryngeal movement;Multiple swallows    Nectar Thick Nectar Thick Liquid: Not tested   Honey Thick Honey Thick Liquid: Not tested   Puree Puree: Impaired Presentation: Spoon Oral Phase Impairments: Poor awareness of bolus Pharyngeal Phase Impairments: Suspected delayed Swallow;Decreased hyoid-laryngeal movement;Multiple swallows   Solid   GO   Solid: Not tested        Billy [name] 08/31/2015,3:17 PM  Billy [name], Student-SLP

## 2015-08-31 NOTE — ED Notes (Signed)
Pt returned from CT °

## 2015-08-31 NOTE — ED Notes (Signed)
Xray at the bedside.

## 2015-08-31 NOTE — ED Provider Notes (Signed)
CSN: [id number]     Arrival date & time 08/31/15  0940 History   First MD Initiated Contact with Patient 08/31/15 [phone]     Chief Complaint  Patient presents with  . Altered Mental Status     (Consider location/radiation/quality/duration/timing/severity/associated sxs/prior Treatment) HPI   55 y m w pmh htn, hl, alzheimer's disease coming in with ams and BRBPR since yesterday Patient has had two bright red stools since yesterday, no complaints of abdominal pain.  Patient is on xarelto for previous dvt.  He denies headache/neck pain/abdominal pain/chest pain  Patient additionally has been intermittently confused since yesterday which is communicated by family member who is at bedside.  Patient has h/o dementia but usually is able to communicate well and intermittently his niece has noticed he has trouble speaking intermittently since yesterday.  His niece has noticed no focal weakness, no facial droop or other neuro sx.  There have been no reported falls.    Past Medical History  Diagnosis Date  . Hypertension   . Hyperlipidemia   . Alzheimer's disease   . HTN (hypertension) 11/21/2014  . Dementia 11/21/2014  . BPH (benign prostatic hyperplasia) 11/21/2014   Past Surgical History  Procedure Laterality Date  . Colonoscopy N/A 05/19/2015    Procedure: COLONOSCOPY;  Surgeon: Vida Rigger, MD;  Location: [facility] ENDOSCOPY;  Service: Endoscopy;  Laterality: N/A;  . Amputation Right 05/26/2015    Procedure: RIGHT ABOVE THE KNEE  AMPUTATION ;  Surgeon: Chuck Hint, MD;  Location: [facility] OR;  Service: Vascular;  Laterality: Right;   No family history on file. Social History  Substance Use Topics  . Smoking status: Former Smoker    Quit date: 06/29/2005  . Smokeless tobacco: Never Used  . Alcohol Use: No    Review of Systems  Constitutional: Negative for fever and chills.  Eyes: Negative for redness.  Respiratory: Negative for cough and shortness of breath.   Cardiovascular: Negative for  chest pain.  Gastrointestinal: Positive for blood in stool. Negative for nausea, vomiting, abdominal pain and diarrhea.  Genitourinary: Negative for dysuria.  Skin: Negative for rash.  Neurological: Negative for headaches.  All other systems reviewed and are negative.     Allergies  Review of patient's allergies indicates no known allergies.  Home Medications   Prior to Admission medications   Medication Sig Start Date End Date Taking? Authorizing Provider  acetaminophen (TYLENOL) 325 MG tablet Take 1-2 tablets (325-650 mg total) by mouth every 4 (four) hours as needed for mild pain or fever. 05/30/15  Yes Osvaldo Shipper, MD  donepezil (ARICEPT) 5 MG tablet Take 5 mg by mouth at bedtime.  10/25/14  Yes Historical Provider, MD  finasteride (PROSCAR) 5 MG tablet Take 5 mg by mouth daily.  10/25/14  Yes Historical Provider, MD  Lactobacillus (ACIDOPHILUS PROBIOTIC) 10 MG TABS Take 10 mg by mouth 3 (three) times daily. 08/09/15  Yes Everlene Farrier, PA-C  metoprolol tartrate (LOPRESSOR) 25 MG tablet Take 1 tablet (25 mg total) by mouth 2 (two) times daily. 05/30/15  Yes Osvaldo Shipper, MD  mirtazapine (REMERON) 7.5 MG tablet Take 7.5 mg by mouth at bedtime.  11/02/14  Yes Historical Provider, MD  Nutritional Supplements (ENSURE NUTRITION SHAKE) LIQD Take 1 Can by mouth 3 (three) times daily with meals. 05/30/15  Yes Osvaldo Shipper, MD  potassium chloride SA (K-DUR,KLOR-CON) 20 MEQ tablet Take 1 tablet (20 mEq total) by mouth daily. 05/31/15  Yes Osvaldo Shipper, MD  rivaroxaban (XARELTO) 20 MG TABS tablet  Take 1 tablet (20 mg total) by mouth daily with supper. PLEASE START ONLY AFTER ALL THE TABLETS IN THE STARTER PACK HAVE BEEN TAKEN. Patient taking differently: Take 20 mg by mouth daily with lunch.  05/30/15  Yes Osvaldo Shipper, MD   BP 169/94 mmHg  Pulse 98  Temp(Src) 98.7 F (37.1 C) (Oral)  Resp 18  SpO2 99% Physical Exam  Constitutional: No distress.  HENT:  Head: Normocephalic and  atraumatic.  Eyes: EOM are normal. Pupils are equal, round, and reactive to light.  Neck: Normal range of motion. Neck supple.  Cardiovascular: Normal rate.   Pulmonary/Chest: Effort normal. No respiratory distress.  Abdominal: Soft. There is no tenderness.  Genitourinary: Guaiac positive stool (red).  Musculoskeletal:  Contractures RUE. RLE AKA.  Amp stump is c/d/i w/o skin breakdown  Neurological: He is alert. No cranial nerve deficit or sensory deficit.  Skin: No rash noted. He is not diaphoretic.  Psychiatric: He has a normal mood and affect.    ED Course  Procedures (including critical care time) Labs Review Labs Reviewed  CBC WITH DIFFERENTIAL/PLATELET - Abnormal; Notable for the following:    WBC 25.0 (*)    RBC 3.68 (*)    Hemoglobin 10.2 (*)    HCT 31.8 (*)    RDW 16.0 (*)    Platelets 407 (*)    Neutro Abs 20.4 (*)    Monocytes Absolute 2.3 (*)    All other components within normal limits  BASIC METABOLIC PANEL - Abnormal; Notable for the following:    Sodium 148 (*)    Chloride 113 (*)    BUN 22 (*)    Creatinine, Ser 1.44 (*)    GFR calc non Af Amer 44 (*)    GFR calc Af Amer 51 (*)    All other components within normal limits  URINALYSIS, ROUTINE W REFLEX MICROSCOPIC (NOT AT [facility]) - Abnormal; Notable for the following:    APPearance CLOUDY (*)    Hgb urine dipstick SMALL (*)    Protein, ur 30 (*)    Nitrite POSITIVE (*)    Leukocytes, UA MODERATE (*)    All other components within normal limits  URINE MICROSCOPIC-ADD ON - Abnormal; Notable for the following:    Squamous Epithelial / LPF 0-5 (*)    Bacteria, UA MANY (*)    All other components within normal limits  HEMOGLOBIN AND HEMATOCRIT, BLOOD - Abnormal; Notable for the following:    Hemoglobin 9.6 (*)    HCT 30.0 (*)    All other components within normal limits  HEMOGLOBIN AND HEMATOCRIT, BLOOD - Abnormal; Notable for the following:    Hemoglobin 9.7 (*)    HCT 31.7 (*)    All other components  within normal limits  COMPREHENSIVE METABOLIC PANEL - Abnormal; Notable for the following:    Sodium 147 (*)    Chloride 114 (*)    Glucose, Bld 109 (*)    BUN 21 (*)    Creatinine, Ser 1.37 (*)    Calcium 8.8 (*)    Total Protein 6.3 (*)    Albumin 2.3 (*)    ALT 11 (*)    Total Bilirubin 0.2 (*)    GFR calc non Af Amer 47 (*)    GFR calc Af Amer 54 (*)    All other components within normal limits  HEMOGLOBIN AND HEMATOCRIT, BLOOD - Abnormal; Notable for the following:    Hemoglobin 10.0 (*)    HCT 32.0 (*)  All other components within normal limits  POC OCCULT BLOOD, ED - Abnormal; Notable for the following:    Fecal Occult Bld POSITIVE (*)    All other components within normal limits  CULTURE, BLOOD (ROUTINE X 2)  CULTURE, BLOOD (ROUTINE X 2)  URINE CULTURE  PROTIME-INR  AMMONIA  HEMOGLOBIN AND HEMATOCRIT, BLOOD  HEMOGLOBIN AND HEMATOCRIT, BLOOD  I-STAT CG4 LACTIC ACID, ED  I-STAT TROPOININ, ED  I-STAT CG4 LACTIC ACID, ED  I-STAT TROPOININ, ED  I-STAT TROPOININ, ED    Imaging Review Ct Head Wo Contrast  08/31/2015  CLINICAL DATA:  Altered mental status EXAM: CT HEAD WITHOUT CONTRAST TECHNIQUE: Contiguous axial images were obtained from the base of the skull through the vertex without intravenous contrast. COMPARISON:  05/21/2015 FINDINGS: There is no evidence of mass effect, midline shift, or extra-axial fluid collections. There is no evidence of a space-occupying lesion or intracranial hemorrhage. There is no evidence of a cortical-based area of acute infarction. There is generalized cerebral atrophy. There is periventricular white matter low attenuation likely secondary to microangiopathy. The ventricles and sulci are appropriate for the patient's age. The basal cisterns are patent. Visualized portions of the orbits are unremarkable. The mastoid sinuses are clear. There is an air-fluid level in the right maxillary sinus. The osseous structures are unremarkable. IMPRESSION:  1.  No acute intracranial pathology. 2. Chronic microvascular disease and cerebral atrophy. Electronically Signed   By: Elige Ko   On: 08/31/2015 11:22   Ct Abdomen Pelvis W Contrast  08/31/2015  CLINICAL DATA:  Diffuse abdomen pain and fever. EXAM: CT ABDOMEN AND PELVIS WITH CONTRAST TECHNIQUE: Multidetector CT imaging of the abdomen and pelvis was performed using the standard protocol following bolus administration of intravenous contrast. CONTRAST:  75mL OMNIPAQUE IOHEXOL 300 MG/ML  SOLN COMPARISON:  None. FINDINGS: There are multiple low-density lesions within the liver, largest measures 5.2 x 5.5 cm consistent with liver cysts. The liver is otherwise unremarkable. The gallbladder is normal. There is mild dilatation of common bile duct measuring 7 mm, likely age related. The spleen, pancreas are normal. There is a 3 cm simple cyst in the upper pole right kidney. There is a 1.2 cm cyst in the lower pole left kidney. There is no nephrolithiasis or hydronephrosis bilaterally. The adrenal glands are normal. There is atherosclerosis of the abdominal aorta without aneurysmal dilatation. There is no abdominal lymphadenopathy. There is no small bowel obstruction. There is diffuse bowel wall thickening with surrounding inflammation of the sigmoid colon and rectum. The remainder of the colon is normal. Fluid-filled bladder is normal. Minimal free fluid is identified in the pelvis. Atelectasis of the posterior lung bases are noted. Emphysematous changes are noted. Degenerative joint changes of the spine are identified. IMPRESSION: Diffuse bowel wall thickening of the rectum and distal sigmoid colon with surrounding inflammation. The findings can be seen in colitis. Liver and kidney cysts. Electronically Signed   By: Sherian Rein M.D.   On: 08/31/2015 17:49   Dg Chest Port 1 View  08/31/2015  CLINICAL DATA:  Altered mental status.  Dementia.  Hypertension. EXAM: PORTABLE CHEST 1 VIEW COMPARISON:  11/21/2014  FINDINGS: Midline trachea. Patient rotated left. Mild cardiomegaly. No pleural effusion or pneumothorax. Clear lungs. IMPRESSION: Mild cardiomegaly, without acute disease. Electronically Signed   By: Jeronimo Greaves M.D.   On: 08/31/2015 10:32   I have personally reviewed and evaluated these images and lab results as part of my medical decision-making.   EKG Interpretation   Date/Time:  Thursday August 31 2015 10:57:16 EST Ventricular Rate:  156 PR Interval:    QRS Duration: 122 QT Interval:  354 QTC Calculation: 570 R Axis:   43 Text Interpretation:  Wide-QRS tachycardia Ventricular tachycardia,  unsustained Nonspecific intraventricular conduction delay Artifact in  lead(s) II aVR aVF V2 V3 V4 V5 V6 ED PHYSICIAN INTERPRETATION AVAILABLE IN  [facility] Confirmed by TEST, Record ([id number]) on 09/01/2015 7:16:39 AM      MDM   Final diagnoses:  BRBPR (bright red blood per rectum) Elevated white blood cell count    83 y m w pmh htn, hl, alzheimer's disease coming in with ams and BRBPR since yesterday  Exam as above.  Patient AFVSS.  Unremarkable neuro exam but intermittent confusion at home per niece.  On rectal FOB+ bright red stool.  Pt is on xarelto.  Will check cbc/cmp to eval for anemia or electrolyte abnormality.  Will obtain ct head to eval for ich, will obtain cxr to eval for infiltrate and ua to eval for infection.  hgb 10.2.  Wbc elevated but pt is afebrile.  ua w/o infection, cxr w/o infiltrate.  Unknown source of elevated wbc.  With BRBPR and h/o crohns, would be concerned for crohns flair.  Pt denies abdominal pain but has sig dementia, will obtain ct abdomen/pelvis to further evaluate.  Will admit for further evaluation.  Will hold off on abx given HDS, no fever, and no source of infection.  Only two bright red stools at home and good hgb, will hold off on trying to reverse xarelto at this time.      Silas Flood, MD 09/01/15 1350  Tilden Fossa, MD 09/02/15  [phone]

## 2015-08-31 NOTE — Consult Note (Signed)
Subjective:   HPI  The patient is an 82 year old male who came to the emergency room because of weakness. He has dementia in the history he gives to me is not very good. I asked him if he had been experiencing any rectal bleeding and he stated no. Apparently his niece who was with him in the emergency room from the notes that I read said that he had been having some rectal bleeding. His hemoglobin appears stable. He denies any abdominal pain. He does have a history of diverticulosis and colitis based on a colonoscopy done last year. He had been on Xarelto.  Review of Systems Currently denies chest pain or shortness of breath  Past Medical History  Diagnosis Date  . Hypertension   . Hyperlipidemia   . Alzheimer's disease   . HTN (hypertension) 11/21/2014  . Dementia 11/21/2014  . BPH (benign prostatic hyperplasia) 11/21/2014   Past Surgical History  Procedure Laterality Date  . Colonoscopy N/A 05/19/2015    Procedure: COLONOSCOPY;  Surgeon: Vida Rigger, MD;  Location: [facility] ENDOSCOPY;  Service: Endoscopy;  Laterality: N/A;  . Amputation Right 05/26/2015    Procedure: RIGHT ABOVE THE KNEE  AMPUTATION ;  Surgeon: Chuck Hint, MD;  Location: [facility] OR;  Service: Vascular;  Laterality: Right;   Social History   Social History  . Marital Status: Married    Spouse Name: N/A  . Number of Children: N/A  . Years of Education: N/A   Occupational History  . Not on file.   Social History Main Topics  . Smoking status: Former Smoker    Quit date: 06/29/2005  . Smokeless tobacco: Never Used  . Alcohol Use: No  . Drug Use: No  . Sexual Activity: Not on file   Other Topics Concern  . Not on file   Social History Narrative   family history is not on file.  Current facility-administered medications:  .  0.9 %  sodium chloride infusion, , Intravenous, Continuous, Dawood S Elgergawy, MD .  0.9 %  sodium chloride infusion, , Intravenous, Continuous, Starleen Arms, MD .  acetaminophen  (TYLENOL) tablet 325-650 mg, 325-650 mg, Oral, Q4H PRN, Leana Roe Elgergawy, MD .  albuterol (PROVENTIL) (2.5 MG/3ML) 0.083% nebulizer solution 2.5 mg, 2.5 mg, Nebulization, Q2H PRN, Starleen Arms, MD .  cefTRIAXone (ROCEPHIN) 1 g in dextrose 5 % 50 mL IVPB, 1 g, Intravenous, Once, Silas Flood, MD .  donepezil (ARICEPT) tablet 5 mg, 5 mg, Oral, QHS, Dawood S Elgergawy, MD .  feeding supplement (ENSURE ENLIVE) (ENSURE ENLIVE) liquid 237 mL, 237 mL, Oral, TID WC, Dawood S Elgergawy, MD .  finasteride (PROSCAR) tablet 5 mg, 5 mg, Oral, Daily, Dawood S Elgergawy, MD .  mirtazapine (REMERON) tablet 7.5 mg, 7.5 mg, Oral, QHS, Dawood S Elgergawy, MD .  pantoprazole (PROTONIX) injection 40 mg, 40 mg, Intravenous, Q12H, Dawood S Elgergawy, MD .  saccharomyces boulardii (FLORASTOR) capsule 250 mg, 250 mg, Oral, TID, Dawood S Elgergawy, MD .  sodium chloride flush (NS) 0.9 % injection 3 mL, 3 mL, Intravenous, Q12H, Dawood S Elgergawy, MD No Known Allergies   Objective:     BP 104/72 mmHg  Pulse 90  Temp(Src) 97.2 F (36.2 C) (Rectal)  Resp 25  SpO2 100%  No distress  Nonicteric  Heart regular rhythm no murmurs  Lungs clear  Abdomen: Bowel sounds normal, soft, nontender    Laboratory No components found for: D1    Assessment:     Weakness  Rectal  bleeding per history  History of diverticulosis  History of colitis      Plan:     The patient appears clinically stable at this time. We will observe him for any further bleeding and base any further workup on his ongoing clinical status. Transfuse blood as needed. His hemoglobin and hematocrit appear stable over the last few weeks. We will follow. Lab Results  Component Value Date   HGB 9.6* 08/31/2015   HGB 10.2* 08/31/2015   HGB 9.5* 08/20/2015   HCT 30.0* 08/31/2015   HCT 31.8* 08/31/2015   HCT 29.4* 08/20/2015   ALKPHOS 66 08/20/2015   ALKPHOS 74 08/09/2015   ALKPHOS 45 05/20/2015   AST 17 08/20/2015   AST 17  08/09/2015   AST 26 05/20/2015   ALT 13* 08/20/2015   ALT 13* 08/09/2015   ALT 17 05/20/2015

## 2015-08-31 NOTE — Progress Notes (Signed)
NURSING PROGRESS NOTE  Izick Gasbarro [id number] Admission Data: 08/31/2015 4:52 PM Attending Provider: Starleen Arms, MD NWG:[id number],[id number], MD Code Status: DNR  Khiyan Crace is a 82 y.o. male patient admitted from ED:  -No acute distress noted.  -No complaints of shortness of breath.  -No complaints of chest pain.   Cardiac Monitoring: Box # 2 in place. Cardiac monitor yields:normal sinus rhythm.  Blood pressure 136/78, pulse 93, temperature 97.8 F (36.6 C), temperature source Oral, resp. rate 18, SpO2 99 %.   IV Fluids:  IV in place, occlusive dsg intact without redness, IV cath forearm left, condition patent and no redness normal saline.   Allergies:  Review of patient's allergies indicates no known allergies.  Past Medical History:   has a past medical history of Hypertension; Hyperlipidemia; Alzheimer's disease; HTN (hypertension) (11/21/2014); Dementia (11/21/2014); and BPH (benign prostatic hyperplasia) (11/21/2014).  Past Surgical History:   has past surgical history that includes Colonoscopy (N/A, 05/19/2015) and Amputation (Right, 05/26/2015).  Social History:   reports that he quit smoking about 10 years ago. He has never used smokeless tobacco. He reports that he does not drink alcohol or use illicit drugs.  Skin: Intact  Patient/Family orientated to room. Information packet given to patient/family. Admission inpatient armband information verified with patient/family to include name and date of birth and placed on patient arm. Side rails up x 2, fall assessment and education completed with patient/family. Patient/family able to verbalize understanding of risk associated with falls and verbalized understanding to call for assistance before getting out of bed. Call light within reach. Patient/family able to voice and demonstrate understanding of unit orientation instructions.    Will continue to evaluate and treat per MD orders.

## 2015-09-01 ENCOUNTER — Inpatient Hospital Stay (HOSPITAL_COMMUNITY): Payer: Medicare Other

## 2015-09-01 DIAGNOSIS — K625 Hemorrhage of anus and rectum: Secondary | ICD-10-CM

## 2015-09-01 DIAGNOSIS — G9340 Encephalopathy, unspecified: Secondary | ICD-10-CM

## 2015-09-01 LAB — COMPREHENSIVE METABOLIC PANEL
ALT: 11 U/L — AB (ref 17–63)
ANION GAP: 11 (ref 5–15)
AST: 22 U/L (ref 15–41)
Albumin: 2.3 g/dL — ABNORMAL LOW (ref 3.5–5.0)
Alkaline Phosphatase: 80 U/L (ref 38–126)
BUN: 21 mg/dL — ABNORMAL HIGH (ref 6–20)
CALCIUM: 8.8 mg/dL — AB (ref 8.9–10.3)
CHLORIDE: 114 mmol/L — AB (ref 101–111)
CO2: 22 mmol/L (ref 22–32)
CREATININE: 1.37 mg/dL — AB (ref 0.61–1.24)
GFR, EST AFRICAN AMERICAN: 54 mL/min — AB (ref >60)
GFR, EST NON AFRICAN AMERICAN: 47 mL/min — AB (ref >60)
Glucose, Bld: 109 mg/dL — ABNORMAL HIGH (ref 65–99)
Potassium: 4 mmol/L (ref 3.5–5.1)
SODIUM: 147 mmol/L — AB (ref 135–145)
Total Bilirubin: 0.2 mg/dL — ABNORMAL LOW (ref 0.3–1.2)
Total Protein: 6.3 g/dL — ABNORMAL LOW (ref 6.5–8.1)

## 2015-09-01 LAB — HEMOGLOBIN AND HEMATOCRIT, BLOOD
HCT: 33.3 % — ABNORMAL LOW (ref 39.0–52.0)
HEMATOCRIT: 31.7 % — AB (ref 39.0–52.0)
HEMATOCRIT: 32 % — AB (ref 39.0–52.0)
HEMOGLOBIN: 10 g/dL — AB (ref 13.0–17.0)
HEMOGLOBIN: 10.1 g/dL — AB (ref 13.0–17.0)
HEMOGLOBIN: 9.7 g/dL — AB (ref 13.0–17.0)

## 2015-09-01 MED ORDER — LORAZEPAM 2 MG/ML IJ SOLN
0.5000 mg | INTRAMUSCULAR | Status: DC | PRN
  Administered 2015-09-01: 0.5 mg via INTRAVENOUS
  Filled 2015-09-01: qty 1

## 2015-09-01 NOTE — Progress Notes (Signed)
Pt just had what appeared to be a seizure witnessed by two staff members. VS are being taken, suction being set up at bedside, provider Gherghe notified. Pt to get VS taken and EEG to be ordered.

## 2015-09-01 NOTE — Progress Notes (Signed)
PT Cancellation Note  Patient Details Name: Billy [name] MRN: [id number] DOB: [birth year]   Cancelled Treatment:    Reason Eval/Treat Not Completed: Patient at procedure or test/unavailable.  Will try to see him later if time allows.   Ivar Drape 09/01/2015, 1:33 PM   Samul Dada, PT MS Acute Rehab Dept. Number: [id number] and [facility] [phone]

## 2015-09-01 NOTE — Procedures (Signed)
ELECTROENCEPHALOGRAM REPORT  Date of Study: 09/01/2015  Patient's Name: Billy [name] MRN: [id number] Date of Birth: 1932-02-20  Indication: Billy [name] is a 82 y.o. male, with a past medical history of dementia and diverticulosis who presents for altered mental status and bright red blood per rectum. He had a possible witnessed seizure.  Medications: Aricept Remeron Cipro Flagyl Proscar  Technical Summary: This is a multichannel digital EEG recording, using the international 10-20 placement system with electrodes applied with paste and impedances below 5000 ohms.    Description: The EEG background is symmetric with generalized slowing of delta activity and posterior dominant rhythm of 6 Hz.  Some of the study is compromised by muscle artifact.  No focal or generalized epileptiform discharges are seen.  Stage II sleep is not seen.  Hyperventilation and photic stimulation were not performed.  ECG revealed normal cardiac rate and rhythm.  Impression: This is an abnormal routine EEG of the awake and drowsy states due to generalized background slowing, indicative of encephalopathy.  This is a nonspecific finding which may be due to dementia or toxic-metabolic, infectious or other diffuse physiologic cerebral dysfunction.  Kalilah Barua R. Everlena Cooper, DO

## 2015-09-01 NOTE — Progress Notes (Signed)
EEG completed, results pending. 

## 2015-09-01 NOTE — Progress Notes (Signed)
Speech Language Pathology Treatment: Dysphagia  Patient Details Name: Billy [name] MRN: [id number] DOB: 12/21/1931 Today's Date: 09/01/2015 Time: 4098-1191 SLP Time Calculation (min) (ACUTE ONLY): 19 min  Assessment / Plan / Recommendation Clinical Impression  Pt appears more alert than as described during initial evaluation, but remains confused with reduced awareness during bolus acceptance. Only Min cues needed for oral manipulation and swallow trigger, but Mod cues were provided for sustained attention. Intermittent throat clearing noted throughout trials of thin liquid (water and Ensure) despite Mod A for smaller sips. No further throat clearing noted once thickened. Recommend initiation of Dys 1 diet and nectar thick liquids with full supervision.   HPI HPI: 82 y.o. male, with a past medical history of dementia, diverticulosis, hypertension, right ischemic foot status post AKA, with admission late last year for lower GI bleed, diverticulosis and chronic ischemic colitis, presents with complaints of altered mental status, bright red blood per rectum, knees and caregiver at bedside giving history, patient has been more altered, with decreased appetite over last 24 hours. CT No acute intracranial pathology. CXR Mild cardiomegaly, without acute disease.      SLP Plan  Continue with current plan of care     Recommendations  Diet recommendations: Dysphagia 1 (puree);Nectar-thick liquid Liquids provided via: Cup;Straw Medication Administration: Crushed with puree Supervision: Patient able to self feed;Full supervision/cueing for compensatory strategies Compensations: Minimize environmental distractions;Slow rate;Small sips/bites Postural Changes and/or Swallow Maneuvers: Seated upright 90 degrees             Oral Care Recommendations: Oral care BID Follow up Recommendations:  (tba) Plan: Continue with current plan of care     GO               [name], M.A.  CCC-SLP [phone]  [name] 09/01/2015, 4:47 PM

## 2015-09-01 NOTE — Progress Notes (Signed)
PROGRESS NOTE  Billy [name] BJY:[id number] DOB: 05-02-1932 DOA: 08/31/2015 PCP: [name], MD  HPI: 82 y.o. male, with a past medical history of dementia, diverticulosis, hypertension, right ischemic foot status post AKA, with admission late last year for lower GI bleed, workup significant for polyps, diverticulosis and chronic ischemic colitis, presents with complaints of altered mental status, bright red blood per rectum  Subjective / 24 H Interval events - Patient demented this morning, no specific complaints  Assessment/Plan: Active Problems:   GI bleed   Leukocytosis   HTN (hypertension)   Protein-calorie malnutrition, severe (HCC)   DVT of right axillary vein, acute   BRBPR (bright red blood per rectum)   Acute encephalopathy    Acute encephalopathy: - Patient with baseline dementia, mental status, CT head with no acute finding, possibly dehydration and infectious process contributing to his acute encephalopathy. - He is on antibiotics with ceftriaxone and metronidazole for his colitis, continue and monitor mental status - Bedside aide reported seizure-like activity today on the floor, lasting a few seconds, patient without loss of consciousness, EEG is negative for active seizures  Bright red blood per rectum - Hemoglobin remained stable, recent colonoscopy significant for diverticulosis, polyps, and chronic colitis. - Monitor H&H every 8 hours, transfuse as needed, requested GI input, CT abdomen and pelvis pending, lower GI bleed as bright blood rectum, will continue with Protonix twice a day. - Gastroenterology is consulted, following  Leukocytosis - Afebrile, normal lactic acid - Metronidazole and ciprofloxacin for colitis - Repeat CBC in a.m.  History of DVT - Continue to hold Xarelto, as per family it should be stopped in 30 days from now.  Protein calorie malnutrition - We'll start on supplemental whenmore stable  Hypertension - Resume his home  medications that he is becoming progressively hypertensive with blood pressure into the 170s this afternoon    Diet: Diet NPO time specified Except for: Other (See Comments) Fluids: NS DVT Prophylaxis: SCD  Code Status: DNR Family Communication: d/w friend bedside  Disposition Plan: home vs SNF when ready  Barriers to discharge: GI bleed, AMS, colitis  Consultants:  GI  Procedures:  None    Antibiotics Ciprofloxacin 2/9>> Metronidazole 2/9 >>   Studies  Ct Head Wo Contrast  08/31/2015  CLINICAL DATA:  Altered mental status EXAM: CT HEAD WITHOUT CONTRAST TECHNIQUE: Contiguous axial images were obtained from the base of the skull through the vertex without intravenous contrast. COMPARISON:  05/21/2015 FINDINGS: There is no evidence of mass effect, midline shift, or extra-axial fluid collections. There is no evidence of a space-occupying lesion or intracranial hemorrhage. There is no evidence of a cortical-based area of acute infarction. There is generalized cerebral atrophy. There is periventricular white matter low attenuation likely secondary to microangiopathy. The ventricles and sulci are appropriate for the patient's age. The basal cisterns are patent. Visualized portions of the orbits are unremarkable. The mastoid sinuses are clear. There is an air-fluid level in the right maxillary sinus. The osseous structures are unremarkable. IMPRESSION: 1.  No acute intracranial pathology. 2. Chronic microvascular disease and cerebral atrophy. Electronically Signed   By: Elige Ko   On: 08/31/2015 11:22   Ct Abdomen Pelvis W Contrast  08/31/2015  CLINICAL DATA:  Diffuse abdomen pain and fever. EXAM: CT ABDOMEN AND PELVIS WITH CONTRAST TECHNIQUE: Multidetector CT imaging of the abdomen and pelvis was performed using the standard protocol following bolus administration of intravenous contrast. CONTRAST:  75mL OMNIPAQUE IOHEXOL 300 MG/ML  SOLN COMPARISON:  None. FINDINGS:  There are multiple  low-density lesions within the liver, largest measures 5.2 x 5.5 cm consistent with liver cysts. The liver is otherwise unremarkable. The gallbladder is normal. There is mild dilatation of common bile duct measuring 7 mm, likely age related. The spleen, pancreas are normal. There is a 3 cm simple cyst in the upper pole right kidney. There is a 1.2 cm cyst in the lower pole left kidney. There is no nephrolithiasis or hydronephrosis bilaterally. The adrenal glands are normal. There is atherosclerosis of the abdominal aorta without aneurysmal dilatation. There is no abdominal lymphadenopathy. There is no small bowel obstruction. There is diffuse bowel wall thickening with surrounding inflammation of the sigmoid colon and rectum. The remainder of the colon is normal. Fluid-filled bladder is normal. Minimal free fluid is identified in the pelvis. Atelectasis of the posterior lung bases are noted. Emphysematous changes are noted. Degenerative joint changes of the spine are identified. IMPRESSION: Diffuse bowel wall thickening of the rectum and distal sigmoid colon with surrounding inflammation. The findings can be seen in colitis. Liver and kidney cysts. Electronically Signed   By: Sherian Rein M.D.   On: 08/31/2015 17:49   Dg Chest Port 1 View  08/31/2015  CLINICAL DATA:  Altered mental status.  Dementia.  Hypertension. EXAM: PORTABLE CHEST 1 VIEW COMPARISON:  11/21/2014 FINDINGS: Midline trachea. Patient rotated left. Mild cardiomegaly. No pleural effusion or pneumothorax. Clear lungs. IMPRESSION: Mild cardiomegaly, without acute disease. Electronically Signed   By: Jeronimo Greaves M.D.   On: 08/31/2015 10:32    Objective  Filed Vitals:   08/31/15 2107 09/01/15 0603 09/01/15 1034 09/01/15 1432  BP: 131/70 117/81 169/94 175/103  Pulse: 100 95 98 96  Temp: 98.3 F (36.8 C) 98.7 F (37.1 C)  97.8 F (36.6 C)  TempSrc: Oral Oral  Oral  Resp:  18  20  SpO2: 100% 99% 99% 100%    Intake/Output Summary (Last  24 hours) at 09/01/15 1441 Last data filed at 09/01/15 1433  Gross per 24 hour  Intake 936.25 ml  Output      1 ml  Net 935.25 ml   There were no vitals filed for this visit.  Exam:  GENERAL: NAD  HEENT: no scleral icterus, PERRL  LUNGS: CTA biL, no wheezing  HEART: RRR without MRG  ABDOMEN: soft, non tender  EXTREMITIES: no clubbing / cyanosis  NEUROLOGIC: non focal  PSYCHIATRIC: withdrawn  Data Reviewed: Basic Metabolic Panel:  Recent Labs Lab 08/31/15 1016 09/01/15 0047  NA 148* 147*  K 4.8 4.0  CL 113* 114*  CO2 25 22  GLUCOSE 87 109*  BUN 22* 21*  CREATININE 1.44* 1.37*  CALCIUM 8.9 8.8*   Liver Function Tests:  Recent Labs Lab 09/01/15 0047  AST 22  ALT 11*  ALKPHOS 80  BILITOT 0.2*  PROT 6.3*  ALBUMIN 2.3*    Recent Labs Lab 08/31/15 1016  AMMONIA 25   CBC:  Recent Labs Lab 08/31/15 1016 08/31/15 1325 09/01/15 0046 09/01/15 0748  WBC 25.0*  --   --   --   NEUTROABS 20.4*  --   --   --   HGB 10.2* 9.6* 9.7* 10.0*  HCT 31.8* 30.0* 31.7* 32.0*  MCV 86.4  --   --   --   PLT 407*  --   --   --      Recent Results (from the past 240 hour(s))  Culture, blood (Routine X 2) w Reflex to ID Panel  Status: None (Preliminary result)   Collection Time: 08/31/15  1:25 PM  Result Value Ref Range Status   Specimen Description BLOOD RIGHT ARM  Final   Special Requests BOTTLES DRAWN AEROBIC ONLY 5CC  Final   Culture NO GROWTH < 24 HOURS  Final   Report Status PENDING  Incomplete  Culture, blood (Routine X 2) w Reflex to ID Panel     Status: None (Preliminary result)   Collection Time: 08/31/15  1:25 PM  Result Value Ref Range Status   Specimen Description BLOOD LEFT HAND  Final   Special Requests BOTTLES DRAWN AEROBIC ONLY 5CC  Final   Culture NO GROWTH < 24 HOURS  Final   Report Status PENDING  Incomplete     Scheduled Meds: . ciprofloxacin  400 mg Intravenous Q12H  . donepezil  5 mg Oral QHS  . feeding supplement (ENSURE  ENLIVE)  237 mL Oral TID WC  . finasteride  5 mg Oral Daily  . metronidazole  500 mg Intravenous Q8H  . mirtazapine  7.5 mg Oral QHS  . pantoprazole (PROTONIX) IV  40 mg Intravenous Q12H  . saccharomyces boulardii  250 mg Oral TID  . sodium chloride flush  3 mL Intravenous Q12H   Continuous Infusions: . sodium chloride 75 mL/hr at 09/01/15 0813  . sodium chloride      Pamella Pert, MD [facility] Pager [phone]. If 7 PM - 7 AM, please contact night-coverage at [email or web address], password [facility] 09/01/2015, 2:41 PM  LOS: 1 day

## 2015-09-01 NOTE — Progress Notes (Signed)
The patient was not in the room today when I came to see him. Hemoglobin and hematocrit are stable. We will follow-up on him tomorrow.

## 2015-09-01 NOTE — Progress Notes (Signed)
SLP Cancellation Note  Patient Details Name: Billy [name] MRN: [id number] DOB: 1931-07-24   Cancelled treatment:       Reason Eval/Treat Not Completed: Medical issues which prohibited therapy. Pt with possible seizure activity, per RN will be leaving soon for EEG. Will hold PO trials for now.   [name], M.A. CCC-SLP [phone]  [name] 09/01/2015, 11:05 AM

## 2015-09-02 DIAGNOSIS — I808 Phlebitis and thrombophlebitis of other sites: Secondary | ICD-10-CM

## 2015-09-02 DIAGNOSIS — E870 Hyperosmolality and hypernatremia: Secondary | ICD-10-CM

## 2015-09-02 LAB — CBC
HEMATOCRIT: 30.7 % — AB (ref 39.0–52.0)
HEMATOCRIT: 33.5 % — AB (ref 39.0–52.0)
HEMOGLOBIN: 9.4 g/dL — AB (ref 13.0–17.0)
Hemoglobin: 10.2 g/dL — ABNORMAL LOW (ref 13.0–17.0)
MCH: 26.1 pg (ref 26.0–34.0)
MCH: 25.8 pg — AB (ref 26.0–34.0)
MCHC: 30.6 g/dL (ref 30.0–36.0)
MCHC: 30.4 g/dL (ref 30.0–36.0)
MCV: 85.3 fL (ref 78.0–100.0)
MCV: 84.6 fL (ref 78.0–100.0)
PLATELETS: 326 10*3/uL (ref 150–400)
Platelets: 339 10*3/uL (ref 150–400)
RBC: 3.6 MIL/uL — AB (ref 4.22–5.81)
RBC: 3.96 MIL/uL — ABNORMAL LOW (ref 4.22–5.81)
RDW: 15.9 % — ABNORMAL HIGH (ref 11.5–15.5)
RDW: 16 % — AB (ref 11.5–15.5)
WBC: 8.2 10*3/uL (ref 4.0–10.5)
WBC: 7.2 10*3/uL (ref 4.0–10.5)

## 2015-09-02 LAB — COMPREHENSIVE METABOLIC PANEL
ALBUMIN: 2.1 g/dL — AB (ref 3.5–5.0)
ALK PHOS: 67 U/L (ref 38–126)
ALT: 13 U/L — AB (ref 17–63)
ANION GAP: 10 (ref 5–15)
AST: 20 U/L (ref 15–41)
BILIRUBIN TOTAL: 0.4 mg/dL (ref 0.3–1.2)
BUN: 15 mg/dL (ref 6–20)
CALCIUM: 8.5 mg/dL — AB (ref 8.9–10.3)
CO2: 22 mmol/L (ref 22–32)
CREATININE: 1.1 mg/dL (ref 0.61–1.24)
Chloride: 115 mmol/L — ABNORMAL HIGH (ref 101–111)
GFR calc Af Amer: >60 mL/min (ref >60)
GFR calc non Af Amer: >60 mL/min (ref >60)
GLUCOSE: 118 mg/dL — AB (ref 65–99)
Potassium: 3.4 mmol/L — ABNORMAL LOW (ref 3.5–5.1)
Sodium: 147 mmol/L — ABNORMAL HIGH (ref 135–145)
TOTAL PROTEIN: 5.9 g/dL — AB (ref 6.5–8.1)

## 2015-09-02 LAB — HEMOGLOBIN AND HEMATOCRIT, BLOOD
HCT: 32.2 % — ABNORMAL LOW (ref 39.0–52.0)
HEMATOCRIT: 30.6 % — AB (ref 39.0–52.0)
Hemoglobin: 9.5 g/dL — ABNORMAL LOW (ref 13.0–17.0)
Hemoglobin: 9.9 g/dL — ABNORMAL LOW (ref 13.0–17.0)

## 2015-09-02 LAB — URINE CULTURE

## 2015-09-02 MED ORDER — DEXTROSE 5 % IV SOLN
INTRAVENOUS | Status: DC
  Administered 2015-09-02: 75 mL via INTRAVENOUS
  Administered 2015-09-03 (×2): via INTRAVENOUS

## 2015-09-02 MED ORDER — METOPROLOL TARTRATE 25 MG PO TABS
25.0000 mg | ORAL_TABLET | Freq: Two times a day (BID) | ORAL | Status: DC
  Administered 2015-09-02 – 2015-09-05 (×7): 25 mg via ORAL
  Filled 2015-09-02 (×3): qty 2
  Filled 2015-09-02 (×4): qty 1
  Filled 2015-09-02: qty 2

## 2015-09-02 MED ORDER — DEXTROSE 5 % IV SOLN
INTRAVENOUS | Status: DC
  Filled 2015-09-02: qty 1000

## 2015-09-02 MED ORDER — DEXTROSE 10 % IV SOLN: INTRAVENOUS | Status: DC

## 2015-09-02 NOTE — Evaluation (Signed)
Physical Therapy Evaluation Patient Details Name: Billy [name] MRN: [id number] DOB: Nov 05, 1931 Today's Date: 09/02/2015   History of Present Illness  HPI: 82 y.o. male, with a past medical history of dementia, diverticulosis, hypertension, right ischemic foot status post AKA, with admission late last year for lower GI bleed, diverticulosis and chronic ischemic colitis, presents with complaints of altered mental status, bright red blood per rectum, knees and caregiver at bedside giving history, patient has been more altered, with decreased appetite over last 24 hours. CT No acute intracranial pathology. CXR Mild cardiomegaly, without acute disease.  Clinical Impression  Pt incontinent of bowel and bladder during therapy session, therefore focused tasks related to bed mobility and perianal hygiene.  Pt may have limited mobility assist at home.  Therapy will continue to follow to assist with discharge planning and follow up recommendations.    Follow Up Recommendations SNF    Equipment Recommendations  None recommended by PT    Recommendations for Other Services       Precautions / Restrictions Precautions Precautions: Fall Precaution Comments: Rt leg TTA Restrictions Weight Bearing Restrictions: No      Mobility  Bed Mobility Overal bed mobility: Needs Assistance Bed Mobility: Rolling;Supine to Sit;Sit to Supine Rolling: Mod assist   Supine to sit: Mod assist Sit to supine: Mod assist   General bed mobility comments: mod cues for hand placement on rail  Transfers Overall transfer level: Needs assistance Equipment used: Rolling walker (2 wheeled) Transfers: Sit to/from Stand Sit to Stand: Mod assist         General transfer comment: static stand at [facility] mod assist while nursing tech assisted with perianal hygeine  Ambulation/Gait                Stairs            Wheelchair Mobility    Modified Rankin (Stroke Patients Only)       Balance  Overall balance assessment: Needs assistance Sitting-balance support: Bilateral upper extremity supported;Feet supported Sitting balance-Leahy Scale: Fair     Standing balance support: Bilateral upper extremity supported Standing balance-Leahy Scale: Poor Standing balance comment: stood x 3 mins with assist                             Pertinent Vitals/Pain Pain Assessment: No/denies pain    Home Living Family/patient expects to be discharged to:: Private residence Living Arrangements: Non-relatives/Friends Available Help at Discharge: Personal care attendant;Available 24 hours/day Type of Home: House Home Access: Level entry     Home Layout: One level Home Equipment: Walker - 2 wheels;Cane - single point;Shower seat;Bedside commode Additional Comments: Pt's neices reported info as pt not able to state    Prior Function Level of Independence: Needs assistance   Gait / Transfers Assistance Needed: Supervision to min guard  ADL's / Homemaking Assistance Needed: caregiver assist wtih bathing/dressing        Hand Dominance   Dominant Hand: Right    Extremity/Trunk Assessment   Upper Extremity Assessment: Generalized weakness           Lower Extremity Assessment: Generalized weakness;RLE deficits/detail RLE Deficits / Details: Rt transtibial amputation, healed incision    Cervical / Trunk Assessment: Kyphotic  Communication   Communication: Expressive difficulties  Cognition Arousal/Alertness: Lethargic Behavior During Therapy: Flat affect Overall Cognitive Status: History of cognitive impairments - at baseline       Memory: Decreased short-term memory  General Comments General comments (skin integrity, edema, etc.): healed Rt amputation, has not been fitted for prosthesis per pt's nieces.  Pt able to ambulate around perimeter of bed using RW prior to this admission    Exercises        Assessment/Plan    PT Assessment  Patient needs continued PT services  PT Diagnosis Generalized weakness;Altered mental status   PT Problem List Decreased strength;Decreased activity tolerance;Decreased balance;Decreased mobility;Decreased knowledge of use of DME;Decreased safety awareness;Decreased knowledge of precautions  PT Treatment Interventions DME instruction;Gait training;Functional mobility training;Therapeutic activities;Therapeutic exercise;Balance training;Neuromuscular re-education;Patient/family education;Wheelchair mobility training   PT Goals (Current goals can be found in the Care Plan section) Acute Rehab PT Goals Patient Stated Goal: unable to state PT Goal Formulation: With family Time For Goal Achievement: 09/16/15 Potential to Achieve Goals: Poor    Frequency Min 3X/week   Barriers to discharge Decreased caregiver support hired caregiver not able to provide physical assist wtih mobility    Co-evaluation               End of Session Equipment Utilized During Treatment: Gait belt Activity Tolerance: Treatment limited secondary to medical complications (Comment) (bladder and bowel incontinence) Patient left: in bed;with call bell/phone within reach;with bed alarm set;with nursing/sitter in room;with SCD's reapplied Nurse Communication: Mobility status         Time: 1610-9604 PT Time Calculation (min) (ACUTE ONLY): 48 min   Charges:   PT Evaluation $PT Eval Moderate Complexity: 1 Procedure PT Treatments $Therapeutic Activity: 8-22 mins $Neuromuscular Re-education: 8-22 mins   PT G CodesNestor [name], PT [phone]  Billy [name] 09/02/2015, 6:28 PM

## 2015-09-02 NOTE — Progress Notes (Signed)
PROGRESS NOTE  Billy [name]:[id number] DOB: 12-Mar-1932 DOA: 08/31/2015 PCP: [name], MD  HPI: 82 y.o. male, with a past medical history of dementia, diverticulosis, hypertension, right ischemic foot status post AKA, with admission late last year for lower GI bleed, workup significant for polyps, diverticulosis and chronic ischemic colitis, presents with complaints of altered mental status, bright red blood per rectum  Subjective / 24 H Interval events - Patient demented this morning, no specific complaints  Assessment/Plan: Active Problems:   GI bleed   Leukocytosis   HTN (hypertension)   Protein-calorie malnutrition, severe (HCC)   DVT of right axillary vein, acute   BRBPR (bright red blood per rectum)   Acute encephalopathy   Acute encephalopathy: - Patient with baseline dementia, mental status, CT head with no acute finding, possibly dehydration and infectious process contributing to his acute encephalopathy. - He is on antibiotics with ciprofloxacin and metronidazole for his colitis, continue and monitor mental status - Bedside aide reported seizure-like activity 2/10, lasting a few seconds, patient without loss of consciousness, EEG is negative for active seizures - due to reported slurred speech underwent MRI 2/10 without acute findings  Bright red blood per rectum - Hemoglobin remained stable, recent colonoscopy significant for diverticulosis, polyps, and chronic colitis. - Monitor H&H every 8 hours, transfuse as needed, requested GI input, CT abdomen and pelvis pending, lower GI bleed as bright blood rectum, will continue with Protonix twice a day. - Gastroenterology is consulted, following  Leukocytosis - Afebrile, normal lactic acid - Metronidazole and ciprofloxacin for colitis - Repeat CBC in a.m.  History of DVT - Continue to hold Xarelto, as per family it should be stopped in 30 days from now.  Protein calorie malnutrition -  severe  Hypertension - Resumed his home medications   Hypernatremia - due to poor po intake - D5 Water - repeat BMP in am  Diet: DIET - DYS 1 Room service appropriate?: Yes; Fluid consistency:: Nectar Thick Fluids: NS DVT Prophylaxis: SCD  Code Status: DNR Family Communication: d/w family Disposition Plan: home vs SNF when ready; PT evaluation pending Barriers to discharge: GI bleed, AMS, colitis  Consultants:  GI  Procedures:  None    Antibiotics Ciprofloxacin 2/9>> Metronidazole 2/9 >>   Studies  Mr Brain Wo Contrast  09/01/2015  CLINICAL DATA:  Altered mental status.  Slurred speech. EXAM: MRI HEAD WITHOUT CONTRAST TECHNIQUE: Multiplanar, multiecho pulse sequences of the brain and surrounding structures were obtained without intravenous contrast. COMPARISON:  CT head without contrast 08/30/2014. FINDINGS: The study is severely degraded by patient motion. The diffusion-weighted images demonstrate no evidence for acute or subacute infarction. Moderate periventricular and subcortical T2 changes are present bilaterally. There is mild to moderate diffuse atrophy. Flow is present in the major intracranial arteries. A left lens replacement is present. The globes and orbits are otherwise intact. A fluid level is present in the right maxillary sinus. The remaining paranasal sinuses and bilateral mastoid air cells are clear. IMPRESSION: 1. No acute intracranial abnormality. 2. The study is severely degraded by patient motion. 3. Moderate periventricular and subcortical white matter disease bilaterally likely reflects the sequela of chronic microvascular ischemia. 4. Right maxillary sinusitis. Electronically Signed   By: Marin Roberts M.D.   On: 09/01/2015 20:52   Ct Abdomen Pelvis W Contrast  08/31/2015  CLINICAL DATA:  Diffuse abdomen pain and fever. EXAM: CT ABDOMEN AND PELVIS WITH CONTRAST TECHNIQUE: Multidetector CT imaging of the abdomen and pelvis was performed using the  standard  protocol following bolus administration of intravenous contrast. CONTRAST:  75mL OMNIPAQUE IOHEXOL 300 MG/ML  SOLN COMPARISON:  None. FINDINGS: There are multiple low-density lesions within the liver, largest measures 5.2 x 5.5 cm consistent with liver cysts. The liver is otherwise unremarkable. The gallbladder is normal. There is mild dilatation of common bile duct measuring 7 mm, likely age related. The spleen, pancreas are normal. There is a 3 cm simple cyst in the upper pole right kidney. There is a 1.2 cm cyst in the lower pole left kidney. There is no nephrolithiasis or hydronephrosis bilaterally. The adrenal glands are normal. There is atherosclerosis of the abdominal aorta without aneurysmal dilatation. There is no abdominal lymphadenopathy. There is no small bowel obstruction. There is diffuse bowel wall thickening with surrounding inflammation of the sigmoid colon and rectum. The remainder of the colon is normal. Fluid-filled bladder is normal. Minimal free fluid is identified in the pelvis. Atelectasis of the posterior lung bases are noted. Emphysematous changes are noted. Degenerative joint changes of the spine are identified. IMPRESSION: Diffuse bowel wall thickening of the rectum and distal sigmoid colon with surrounding inflammation. The findings can be seen in colitis. Liver and kidney cysts. Electronically Signed   By: Sherian Rein M.D.   On: 08/31/2015 17:49    Objective  Filed Vitals:   09/01/15 1034 09/01/15 1432 09/01/15 2156 09/02/15 0600  BP: 169/94 175/103 137/93 177/95  Pulse: 98 96 103 82  Temp:  97.8 F (36.6 C) 98.4 F (36.9 C) 98.1 F (36.7 C)  TempSrc:  Oral Oral Oral  Resp:  20 19 20   SpO2: 99% 100% 99% 99%    Intake/Output Summary (Last 24 hours) at 09/02/15 1137 Last data filed at 09/02/15 1055  Gross per 24 hour  Intake   2670 ml  Output      0 ml  Net   2670 ml   There were no vitals filed for this visit.  Exam:  GENERAL: NAD  HEENT: no  scleral icterus, PERRL  LUNGS: CTA biL, no wheezing  HEART: RRR without MRG  ABDOMEN: soft, non tender  EXTREMITIES: no clubbing / cyanosis  NEUROLOGIC: non focal  PSYCHIATRIC: withdrawn  Data Reviewed: Basic Metabolic Panel:  Recent Labs Lab 08/31/15 1016 09/01/15 0047 09/02/15 0740  NA 148* 147* 147*  K 4.8 4.0 3.4*  CL 113* 114* 115*  CO2 25 22 22   GLUCOSE 87 109* 118*  BUN 22* 21* 15  CREATININE 1.44* 1.37* 1.10  CALCIUM 8.9 8.8* 8.5*   Liver Function Tests:  Recent Labs Lab 09/01/15 0047 09/02/15 0740  AST 22 20  ALT 11* 13*  ALKPHOS 80 67  BILITOT 0.2* 0.4  PROT 6.3* 5.9*  ALBUMIN 2.3* 2.1*    Recent Labs Lab 08/31/15 1016  AMMONIA 25   CBC:  Recent Labs Lab 08/31/15 1016  09/01/15 0046 09/01/15 0748 09/01/15 1623 09/01/15 2356 09/02/15 0740  WBC 25.0*  --   --   --   --   --  8.2  NEUTROABS 20.4*  --   --   --   --   --   --   HGB 10.2*  < > 9.7* 10.0* 10.1* 9.5* 9.4*  HCT 31.8*  < > 31.7* 32.0* 33.3* 30.6* 30.7*  MCV 86.4  --   --   --   --   --  85.3  PLT 407*  --   --   --   --   --  339  < > =  values in this interval not displayed.   Recent Results (from the past 240 hour(s))  Urine culture     Status: None   Collection Time: 08/31/15 10:55 AM  Result Value Ref Range Status   Specimen Description URINE, CLEAN CATCH  Final   Special Requests NONE  Final   Culture >=100,000 COLONIES/mL ESCHERICHIA COLI  Final   Report Status 09/02/2015 FINAL  Final   Organism ID, Bacteria ESCHERICHIA COLI  Final      Susceptibility   Escherichia coli - MIC*    AMPICILLIN >=32 RESISTANT Resistant     CEFAZOLIN <=4 SENSITIVE Sensitive     CEFTRIAXONE <=1 SENSITIVE Sensitive     CIPROFLOXACIN <=0.25 SENSITIVE Sensitive     GENTAMICIN >=16 RESISTANT Resistant     IMIPENEM <=0.25 SENSITIVE Sensitive     NITROFURANTOIN <=16 SENSITIVE Sensitive     TRIMETH/SULFA <=20 SENSITIVE Sensitive     AMPICILLIN/SULBACTAM 16 INTERMEDIATE Intermediate      PIP/TAZO <=4 SENSITIVE Sensitive     * >=100,000 COLONIES/mL ESCHERICHIA COLI  Culture, blood (Routine X 2) w Reflex to ID Panel     Status: None (Preliminary result)   Collection Time: 08/31/15  1:25 PM  Result Value Ref Range Status   Specimen Description BLOOD RIGHT ARM  Final   Special Requests BOTTLES DRAWN AEROBIC ONLY 5CC  Final   Culture NO GROWTH 2 DAYS  Final   Report Status PENDING  Incomplete  Culture, blood (Routine X 2) w Reflex to ID Panel     Status: None (Preliminary result)   Collection Time: 08/31/15  1:25 PM  Result Value Ref Range Status   Specimen Description BLOOD LEFT HAND  Final   Special Requests BOTTLES DRAWN AEROBIC ONLY 5CC  Final   Culture NO GROWTH 2 DAYS  Final   Report Status PENDING  Incomplete     Scheduled Meds: . ciprofloxacin  400 mg Intravenous Q12H  . donepezil  5 mg Oral QHS  . feeding supplement (ENSURE ENLIVE)  237 mL Oral TID WC  . finasteride  5 mg Oral Daily  . metoprolol tartrate  25 mg Oral BID  . metronidazole  500 mg Intravenous Q8H  . mirtazapine  7.5 mg Oral QHS  . pantoprazole (PROTONIX) IV  40 mg Intravenous Q12H  . saccharomyces boulardii  250 mg Oral TID  . sodium chloride flush  3 mL Intravenous Q12H   Continuous Infusions: . sodium chloride 1,000 mL (09/02/15 0412)  . sodium chloride      Pamella Pert, MD [facility] Pager [phone]. If 7 PM - 7 AM, please contact night-coverage at [email or web address], password [facility] 09/02/2015, 11:37 AM  LOS: 2 days

## 2015-09-03 LAB — HEMOGLOBIN AND HEMATOCRIT, BLOOD
HEMATOCRIT: 31.6 % — AB (ref 39.0–52.0)
HEMOGLOBIN: 9.5 g/dL — AB (ref 13.0–17.0)

## 2015-09-03 LAB — BASIC METABOLIC PANEL
Anion gap: 11 (ref 5–15)
BUN: 19 mg/dL (ref 6–20)
CHLORIDE: 113 mmol/L — AB (ref 101–111)
CO2: 20 mmol/L — ABNORMAL LOW (ref 22–32)
Calcium: 8.7 mg/dL — ABNORMAL LOW (ref 8.9–10.3)
Creatinine, Ser: 1.22 mg/dL (ref 0.61–1.24)
GFR calc non Af Amer: 54 mL/min — ABNORMAL LOW (ref >60)
Glucose, Bld: 121 mg/dL — ABNORMAL HIGH (ref 65–99)
POTASSIUM: 3.8 mmol/L (ref 3.5–5.1)
SODIUM: 144 mmol/L (ref 135–145)

## 2015-09-03 MED ORDER — LORAZEPAM 2 MG/ML IJ SOLN
0.5000 mg | Freq: Once | INTRAMUSCULAR | Status: AC
  Administered 2015-09-03: 0.5 mg via INTRAVENOUS
  Filled 2015-09-03: qty 1

## 2015-09-03 NOTE — Progress Notes (Signed)
PROGRESS NOTE  Billy [name] ZOX:[id number] DOB: 05-06-1932 DOA: 08/31/2015 PCP: [name], MD  HPI: 82 y.o. male, with a past medical history of dementia, diverticulosis, hypertension, right ischemic foot status post AKA, with admission late last year for lower GI bleed, workup significant for polyps, diverticulosis and chronic ischemic colitis, presents with complaints of altered mental status, bright red blood per rectum  Subjective / 24 H Interval events - Patient demented, no specific complaints, alert but disoriented  Assessment/Plan: Active Problems:   GI bleed   Leukocytosis   HTN (hypertension)   Protein-calorie malnutrition, severe (HCC)   DVT of right axillary vein, acute   BRBPR (bright red blood per rectum)   Acute encephalopathy   Hypernatremia   Acute encephalopathy: - Patient with baseline dementia, mental status, CT head with no acute finding, possibly dehydration and infectious process contributing to his acute encephalopathy. - He is on antibiotics with ciprofloxacin and metronidazole for his colitis, continue and monitor mental status - Bedside aide reported seizure-like activity 2/10, lasting a few seconds, patient without loss of consciousness, EEG is negative for active seizures - due to reported slurred speech underwent MRI 2/10 without acute findings - I believe he is close to baseline, he does have somewhat of a delirium associated with inpatient stay  Bright red blood per rectum - Hemoglobin remained stable, recent colonoscopy significant for diverticulosis, polyps, and chronic colitis. - Monitor H&H every 8 hours, transfuse as needed, requested GI input, CT abdomen and pelvis pending, lower GI bleed as bright blood rectum, will continue with Protonix twice a day. - Gastroenterology is consulted, following - This is stable, no further bleeding, hemoglobin stable  Leukocytosis - Afebrile, normal lactic acid - Metronidazole and ciprofloxacin  for colitis - Repeat CBC in a.m. shows improvement  History of DVT - Continue to hold Xarelto, as per family it should be stopped in 30 days from now.  Protein calorie malnutrition - severe  Hypertension - Resumed his home medications   Hypernatremia - due to poor po intake - D5 Water - repeat BMP in am  Diet: DIET - DYS 1 Room service appropriate?: Yes; Fluid consistency:: Nectar Thick Fluids: NS DVT Prophylaxis: SCD  Code Status: DNR Family Communication: d/w family Disposition Plan: home vs SNF when ready; PT evaluation pending Barriers to discharge: GI bleed, AMS, colitis  Consultants:  GI  Procedures:  None    Antibiotics Ciprofloxacin 2/9>> Metronidazole 2/9 >>   Studies  Mr Brain Wo Contrast  09/01/2015  CLINICAL DATA:  Altered mental status.  Slurred speech. EXAM: MRI HEAD WITHOUT CONTRAST TECHNIQUE: Multiplanar, multiecho pulse sequences of the brain and surrounding structures were obtained without intravenous contrast. COMPARISON:  CT head without contrast 08/30/2014. FINDINGS: The study is severely degraded by patient motion. The diffusion-weighted images demonstrate no evidence for acute or subacute infarction. Moderate periventricular and subcortical T2 changes are present bilaterally. There is mild to moderate diffuse atrophy. Flow is present in the major intracranial arteries. A left lens replacement is present. The globes and orbits are otherwise intact. A fluid level is present in the right maxillary sinus. The remaining paranasal sinuses and bilateral mastoid air cells are clear. IMPRESSION: 1. No acute intracranial abnormality. 2. The study is severely degraded by patient motion. 3. Moderate periventricular and subcortical white matter disease bilaterally likely reflects the sequela of chronic microvascular ischemia. 4. Right maxillary sinusitis. Electronically Signed   By: Marin Roberts M.D.   On: 09/01/2015 20:52    Objective  Filed  Vitals:     09/02/15 0600 09/02/15 1315 09/02/15 2139 09/03/15 0541  BP: 177/95 114/64 122/84 163/94  Pulse: 82 75 83 102  Temp: 98.1 F (36.7 C) 97.4 F (36.3 C) 98.7 F (37.1 C) 98.5 F (36.9 C)  TempSrc: Oral Oral Oral Oral  Resp: 20 20 16 18   SpO2: 99% 100% 96% 100%    Intake/Output Summary (Last 24 hours) at 09/03/15 1303 Last data filed at 09/03/15 0935  Gross per 24 hour  Intake   2660 ml  Output      0 ml  Net   2660 ml   There were no vitals filed for this visit.  Exam:  GENERAL: NAD  HEENT: no scleral icterus, PERRL  LUNGS: CTA biL, no wheezing  HEART: RRR without MRG  ABDOMEN: soft, non tender  EXTREMITIES: no clubbing / cyanosis  NEUROLOGIC: non focal  PSYCHIATRIC: withdrawn  Data Reviewed: Basic Metabolic Panel:  Recent Labs Lab 08/31/15 1016 09/01/15 0047 09/02/15 0740 09/02/15 2327  NA 148* 147* 147* 144  K 4.8 4.0 3.4* 3.8  CL 113* 114* 115* 113*  CO2 25 22 22  20*  GLUCOSE 87 109* 118* 121*  BUN 22* 21* 15 19  CREATININE 1.44* 1.37* 1.10 1.22  CALCIUM 8.9 8.8* 8.5* 8.7*   Liver Function Tests:  Recent Labs Lab 09/01/15 0047 09/02/15 0740  AST 22 20  ALT 11* 13*  ALKPHOS 80 67  BILITOT 0.2* 0.4  PROT 6.3* 5.9*  ALBUMIN 2.3* 2.1*    Recent Labs Lab 08/31/15 1016  AMMONIA 25   CBC:  Recent Labs Lab 08/31/15 1016  09/01/15 2356 09/02/15 0740 09/02/15 1641 09/02/15 2327 09/03/15 0749  WBC 25.0*  --   --  8.2  --  7.2  --   NEUTROABS 20.4*  --   --   --   --   --   --   HGB 10.2*  < > 9.5* 9.4* 9.9* 10.2* 9.5*  HCT 31.8*  < > 30.6* 30.7* 32.2* 33.5* 31.6*  MCV 86.4  --   --  85.3  --  84.6  --   PLT 407*  --   --  339  --  326  --   < > = values in this interval not displayed.   Recent Results (from the past 240 hour(s))  Urine culture     Status: None   Collection Time: 08/31/15 10:55 AM  Result Value Ref Range Status   Specimen Description URINE, CLEAN CATCH  Final   Special Requests NONE  Final   Culture  >=100,000 COLONIES/mL ESCHERICHIA COLI  Final   Report Status 09/02/2015 FINAL  Final   Organism ID, Bacteria ESCHERICHIA COLI  Final      Susceptibility   Escherichia coli - MIC*    AMPICILLIN >=32 RESISTANT Resistant     CEFAZOLIN <=4 SENSITIVE Sensitive     CEFTRIAXONE <=1 SENSITIVE Sensitive     CIPROFLOXACIN <=0.25 SENSITIVE Sensitive     GENTAMICIN >=16 RESISTANT Resistant     IMIPENEM <=0.25 SENSITIVE Sensitive     NITROFURANTOIN <=16 SENSITIVE Sensitive     TRIMETH/SULFA <=20 SENSITIVE Sensitive     AMPICILLIN/SULBACTAM 16 INTERMEDIATE Intermediate     PIP/TAZO <=4 SENSITIVE Sensitive     * >=100,000 COLONIES/mL ESCHERICHIA COLI  Culture, blood (Routine X 2) w Reflex to ID Panel     Status: None (Preliminary result)   Collection Time: 08/31/15  1:25 PM  Result Value Ref Range Status  Specimen Description BLOOD RIGHT ARM  Final   Special Requests BOTTLES DRAWN AEROBIC ONLY 5CC  Final   Culture  Setup Time   Final    GRAM NEGATIVE RODS AEROBIC BOTTLE ONLY CRITICAL RESULT CALLED TO, READ BACK BY AND VERIFIED WITH: L WHITTAKER,RN AT 2956 09/03/15 BY L BENFIELD    Culture GRAM NEGATIVE RODS  Final   Report Status PENDING  Incomplete  Culture, blood (Routine X 2) w Reflex to ID Panel     Status: None (Preliminary result)   Collection Time: 08/31/15  1:25 PM  Result Value Ref Range Status   Specimen Description BLOOD LEFT HAND  Final   Special Requests BOTTLES DRAWN AEROBIC ONLY 5CC  Final   Culture NO GROWTH 3 DAYS  Final   Report Status PENDING  Incomplete     Scheduled Meds: . ciprofloxacin  400 mg Intravenous Q12H  . donepezil  5 mg Oral QHS  . feeding supplement (ENSURE ENLIVE)  237 mL Oral TID WC  . finasteride  5 mg Oral Daily  . metoprolol tartrate  25 mg Oral BID  . metronidazole  500 mg Intravenous Q8H  . mirtazapine  7.5 mg Oral QHS  . pantoprazole (PROTONIX) IV  40 mg Intravenous Q12H  . saccharomyces boulardii  250 mg Oral TID  . sodium chloride flush  3  mL Intravenous Q12H   Continuous Infusions: . dextrose 75 mL/hr at 09/03/15 2130    Pamella Pert, MD [facility] Pager [phone]. If 7 PM - 7 AM, please contact night-coverage at [email or web address], password [facility] 09/03/2015, 1:03 PM  LOS: 3 days

## 2015-09-03 NOTE — Progress Notes (Signed)
[facility] Progress Note  Subjective: No stools that I can tell, poor historian  Objective: Vital signs in last 24 hours: Temp:  [97.4 F (36.3 C)-98.7 F (37.1 C)] 98.5 F (36.9 C) (02/12 0541) Pulse Rate:  [75-102] 102 (02/12 0541) Resp:  [16-20] 18 (02/12 0541) BP: (114-163)/(64-94) 163/94 mmHg (02/12 0541) SpO2:  [96 %-100 %] 100 % (02/12 0541) Weight change:    PE: Abdomen soft  Lab Results: Results for orders placed or performed during the hospital encounter of 08/31/15 (from the past 24 hour(s))  Hemoglobin and hematocrit, blood     Status: Abnormal   Collection Time: 09/02/15  4:41 PM  Result Value Ref Range   Hemoglobin 9.9 (L) 13.0 - 17.0 g/dL   HCT 16.1 (L) 09.6 - 04.5 %  Basic metabolic panel     Status: Abnormal   Collection Time: 09/02/15 11:27 PM  Result Value Ref Range   Sodium 144 135 - 145 mmol/L   Potassium 3.8 3.5 - 5.1 mmol/L   Chloride 113 (H) 101 - 111 mmol/L   CO2 20 (L) 22 - 32 mmol/L   Glucose, Bld 121 (H) 65 - 99 mg/dL   BUN 19 6 - 20 mg/dL   Creatinine, Ser 4.09 0.61 - 1.24 mg/dL   Calcium 8.7 (L) 8.9 - 10.3 mg/dL   GFR calc non Af Amer 54 (L) >60 mL/min   GFR calc Af Amer >60 >60 mL/min   Anion gap 11 5 - 15  CBC     Status: Abnormal   Collection Time: 09/02/15 11:27 PM  Result Value Ref Range   WBC 7.2 4.0 - 10.5 K/uL   RBC 3.96 (L) 4.22 - 5.81 MIL/uL   Hemoglobin 10.2 (L) 13.0 - 17.0 g/dL   HCT 81.1 (L) 91.4 - 78.2 %   MCV 84.6 78.0 - 100.0 fL   MCH 25.8 (L) 26.0 - 34.0 pg   MCHC 30.4 30.0 - 36.0 g/dL   RDW 95.6 (H) 21.3 - 08.6 %   Platelets 326 150 - 400 K/uL    Studies/Results: Mr Brain Wo Contrast  09/01/2015  CLINICAL DATA:  Altered mental status.  Slurred speech. EXAM: MRI HEAD WITHOUT CONTRAST TECHNIQUE: Multiplanar, multiecho pulse sequences of the brain and surrounding structures were obtained without intravenous contrast. COMPARISON:  CT head without contrast 08/30/2014. FINDINGS: The study is severely  degraded by patient motion. The diffusion-weighted images demonstrate no evidence for acute or subacute infarction. Moderate periventricular and subcortical T2 changes are present bilaterally. There is mild to moderate diffuse atrophy. Flow is present in the major intracranial arteries. A left lens replacement is present. The globes and orbits are otherwise intact. A fluid level is present in the right maxillary sinus. The remaining paranasal sinuses and bilateral mastoid air cells are clear. IMPRESSION: 1. No acute intracranial abnormality. 2. The study is severely degraded by patient motion. 3. Moderate periventricular and subcortical white matter disease bilaterally likely reflects the sequela of chronic microvascular ischemia. 4. Right maxillary sinusitis. Electronically Signed   By: Marin Roberts M.D.   On: 09/01/2015 20:52      Assessment: Reported rectal bleeding, hemoglobin stable. History of pan colitis by colonoscopy one year ago.  Plan: Was discharged to a year ago on 800 mg mesalamine 3 times a day. Would resume. We'll continue to monitor stools and hemoglobin.    Bennye Nix C 09/03/2015, 8:22 AM  Pager [phone] If no answer or after 5 PM call [phone]

## 2015-09-04 ENCOUNTER — Inpatient Hospital Stay (HOSPITAL_COMMUNITY): Payer: Medicare Other

## 2015-09-04 ENCOUNTER — Encounter (HOSPITAL_COMMUNITY): Payer: Medicare Other

## 2015-09-04 DIAGNOSIS — R52 Pain, unspecified: Secondary | ICD-10-CM

## 2015-09-04 LAB — CBC
HCT: 31.5 % — ABNORMAL LOW (ref 39.0–52.0)
Hemoglobin: 10.1 g/dL — ABNORMAL LOW (ref 13.0–17.0)
MCH: 26.9 pg (ref 26.0–34.0)
MCHC: 32.1 g/dL (ref 30.0–36.0)
MCV: 83.8 fL (ref 78.0–100.0)
PLATELETS: 235 10*3/uL (ref 150–400)
RBC: 3.76 MIL/uL — AB (ref 4.22–5.81)
RDW: 15.8 % — ABNORMAL HIGH (ref 11.5–15.5)
WBC: 7.9 10*3/uL (ref 4.0–10.5)

## 2015-09-04 NOTE — Progress Notes (Signed)
PROGRESS NOTE  Billy [name] ZOX:[id number] DOB: 02/02/1932 DOA: 08/31/2015 PCP: [name], MD  HPI: 82 y.o. male, with a past medical history of dementia, diverticulosis, hypertension, right ischemic foot status post AKA, with admission late last year for lower GI bleed, workup significant for polyps, diverticulosis and chronic ischemic colitis, presents with complaints of altered mental status, bright red blood per rectum  Subjective / 24 H Interval events - ongoing confusion, no complaints  Assessment/Plan: Active Problems:   GI bleed   Leukocytosis   HTN (hypertension)   Protein-calorie malnutrition, severe (HCC)   DVT of right axillary vein, acute   BRBPR (bright red blood per rectum)   Acute encephalopathy   Hypernatremia   Acute encephalopathy: - Patient with baseline dementia, mental status, CT head with no acute finding, possibly dehydration and infectious process contributing to his acute encephalopathy. - He is on antibiotics with ciprofloxacin and metronidazole for his colitis, continue and monitor mental status - Bedside aide reported seizure-like activity 2/10, lasting a few seconds, patient without loss of consciousness, EEG is negative for active seizures - due to reported slurred speech underwent MRI 2/10 without acute findings - I believe he is close to baseline, he does have somewhat of a delirium associated with inpatient stay  Bright red blood per rectum - Hemoglobin remained stable, recent colonoscopy significant for diverticulosis, polyps, and chronic colitis. - Monitor H&H every 8 hours, transfuse as needed, requested GI input, CT abdomen and pelvis pending, lower GI bleed as bright blood rectum, will continue with Protonix twice a day. - Gastroenterology is consulted, signed off 2/13 - This is stable, no further bleeding, hemoglobin stable  Leukocytosis - Afebrile, normal lactic acid - Metronidazole and ciprofloxacin for colitis - Repeat CBC  in a.m. shows improvement  History of DVT - Continue to hold Xarelto, as per family it should be stopped in 30 days from now. - Repeat the DVT ultrasound today  Protein calorie malnutrition - severe  Hypertension - Resumed his home medications   Hypernatremia - due to poor po intake - resolved  Diet: DIET - DYS 1 Room service appropriate?: Yes; Fluid consistency:: Nectar Thick Fluids: NS DVT Prophylaxis: SCD  Code Status: DNR Family Communication: d/w family Disposition Plan: home vs SNF when ready; PT evaluation pending Barriers to discharge: GI bleed, AMS, colitis  Consultants:  GI  Procedures:  None    Antibiotics Ciprofloxacin 2/9>> Metronidazole 2/9 >>   Studies  No results found.  Objective  Filed Vitals:   09/03/15 0541 09/03/15 1527 09/03/15 2115 09/04/15 0500  BP: 163/94 139/80 132/92 147/89  Pulse: 102 55 91 76  Temp: 98.5 F (36.9 C) 98.7 F (37.1 C) 98.3 F (36.8 C) 98.4 F (36.9 C)  TempSrc: Oral  Oral Oral  Resp: 18 13 18 16   SpO2: 100% 92% 100% 98%   No intake or output data in the 24 hours ending 09/04/15 1426 There were no vitals filed for this visit.  Exam:  GENERAL: NAD  HEENT: no scleral icterus, PERRL  LUNGS: CTA biL, no wheezing  HEART: RRR without MRG  ABDOMEN: soft, non tender  EXTREMITIES: no clubbing / cyanosis  NEUROLOGIC: non focal  PSYCHIATRIC: withdrawn  Data Reviewed: Basic Metabolic Panel:  Recent Labs Lab 08/31/15 1016 09/01/15 0047 09/02/15 0740 09/02/15 2327  NA 148* 147* 147* 144  K 4.8 4.0 3.4* 3.8  CL 113* 114* 115* 113*  CO2 25 22 22  20*  GLUCOSE 87 109* 118* 121*  BUN 22* 21*  15 19  CREATININE 1.44* 1.37* 1.10 1.22  CALCIUM 8.9 8.8* 8.5* 8.7*   Liver Function Tests:  Recent Labs Lab 09/01/15 0047 09/02/15 0740  AST 22 20  ALT 11* 13*  ALKPHOS 80 67  BILITOT 0.2* 0.4  PROT 6.3* 5.9*  ALBUMIN 2.3* 2.1*    Recent Labs Lab 08/31/15 1016  AMMONIA 25   CBC:  Recent  Labs Lab 08/31/15 1016  09/02/15 0740 09/02/15 1641 09/02/15 2327 09/03/15 0749 09/04/15 0630  WBC 25.0*  --  8.2  --  7.2  --  7.9  NEUTROABS 20.4*  --   --   --   --   --   --   HGB 10.2*  < > 9.4* 9.9* 10.2* 9.5* 10.1*  HCT 31.8*  < > 30.7* 32.2* 33.5* 31.6* 31.5*  MCV 86.4  --  85.3  --  84.6  --  83.8  PLT 407*  --  339  --  326  --  235  < > = values in this interval not displayed.   Recent Results (from the past 240 hour(s))  Urine culture     Status: None   Collection Time: 08/31/15 10:55 AM  Result Value Ref Range Status   Specimen Description URINE, CLEAN CATCH  Final   Special Requests NONE  Final   Culture >=100,000 COLONIES/mL ESCHERICHIA COLI  Final   Report Status 09/02/2015 FINAL  Final   Organism ID, Bacteria ESCHERICHIA COLI  Final      Susceptibility   Escherichia coli - MIC*    AMPICILLIN >=32 RESISTANT Resistant     CEFAZOLIN <=4 SENSITIVE Sensitive     CEFTRIAXONE <=1 SENSITIVE Sensitive     CIPROFLOXACIN <=0.25 SENSITIVE Sensitive     GENTAMICIN >=16 RESISTANT Resistant     IMIPENEM <=0.25 SENSITIVE Sensitive     NITROFURANTOIN <=16 SENSITIVE Sensitive     TRIMETH/SULFA <=20 SENSITIVE Sensitive     AMPICILLIN/SULBACTAM 16 INTERMEDIATE Intermediate     PIP/TAZO <=4 SENSITIVE Sensitive     * >=100,000 COLONIES/mL ESCHERICHIA COLI  Culture, blood (Routine X 2) w Reflex to ID Panel     Status: None (Preliminary result)   Collection Time: 08/31/15  1:25 PM  Result Value Ref Range Status   Specimen Description BLOOD RIGHT ARM  Final   Special Requests BOTTLES DRAWN AEROBIC ONLY 5CC  Final   Culture  Setup Time   Final    GRAM NEGATIVE RODS AEROBIC BOTTLE ONLY CRITICAL RESULT CALLED TO, READ BACK BY AND VERIFIED WITH: L WHITTAKER,RN AT 1610 09/03/15 BY L BENFIELD    Culture ESCHERICHIA COLI  Final   Report Status PENDING  Incomplete  Culture, blood (Routine X 2) w Reflex to ID Panel     Status: None (Preliminary result)   Collection Time: 08/31/15   1:25 PM  Result Value Ref Range Status   Specimen Description BLOOD LEFT HAND  Final   Special Requests BOTTLES DRAWN AEROBIC ONLY 5CC  Final   Culture NO GROWTH 4 DAYS  Final   Report Status PENDING  Incomplete     Scheduled Meds: . ciprofloxacin  400 mg Intravenous Q12H  . donepezil  5 mg Oral QHS  . feeding supplement (ENSURE ENLIVE)  237 mL Oral TID WC  . finasteride  5 mg Oral Daily  . metoprolol tartrate  25 mg Oral BID  . metronidazole  500 mg Intravenous Q8H  . mirtazapine  7.5 mg Oral QHS  . pantoprazole (PROTONIX) IV  40 mg Intravenous Q12H  . saccharomyces boulardii  250 mg Oral TID  . sodium chloride flush  3 mL Intravenous Q12H   Continuous Infusions: . dextrose 75 mL/hr at 09/03/15 1942    Pamella Pert, MD [facility] Pager [phone]. If 7 PM - 7 AM, please contact night-coverage at [email or web address], password [email or web address] 09/04/2015, 2:26 PM  LOS: 4 days

## 2015-09-04 NOTE — Progress Notes (Signed)
VASCULAR LAB PRELIMINARY  PRELIMINARY  PRELIMINARY  PRELIMINARY  Bilateral lower extremity venous duplex completed.    Preliminary report:  There is no DVT or SVT noted in the bilateral lower extremities.  There is sluggish flow noted throughout the left lower extremity.   Billy [name], RVT 09/04/2015, 2:38 PM

## 2015-09-04 NOTE — Care Management Important Message (Signed)
Important Message  Patient Details  Name: Billy [name] MRN: [id number] Date of Birth: 23-Jun-1932   Medicare Important Message Given:  Yes    Caydee Talkington P Taaliyah Delpriore 09/04/2015, 2:14 PM

## 2015-09-04 NOTE — Progress Notes (Signed)
Physical Therapy Treatment Patient Details Name: Billy [name] MRN: [id number] DOB: 20-May-1932 Today's Date: 09/04/2015    History of Present Illness HPI: 82 y.o. male, with a past medical history of dementia, diverticulosis, hypertension, right ischemic foot status post AKA, with admission late last year for lower GI bleed, diverticulosis and chronic ischemic colitis, presents with complaints of altered mental status, bright red blood per rectum, knees and caregiver at bedside giving history, patient has been more altered, with decreased appetite over last 24 hours. CT No acute intracranial pathology. CXR Mild cardiomegaly, without acute disease.    PT Comments    Patient perhaps more confused than during last PT session as pt with incr difficulty following one step commands and restless/easily distracted. Patient was unable to stand with RW with 1 person assist and with incr difficulty processing/following instructions to use RW.    Follow Up Recommendations  SNF     Equipment Recommendations  None recommended by PT    Recommendations for Other Services       Precautions / Restrictions Precautions Precautions: Fall Precaution Comments: Rt AKA Restrictions Weight Bearing Restrictions: No    Mobility  Bed Mobility Overal bed mobility: Needs Assistance Bed Mobility: Rolling;Supine to Sit;Sit to Supine Rolling: Min assist   Supine to sit: Mod assist Sit to supine: Min assist   General bed mobility comments: rolled each side x2 for pericare and linen change  Transfers Overall transfer level: Needs assistance Equipment used: Rolling walker (2 wheeled);None Transfers: Sit to/from Stand;Lateral/Scoot Transfers Sit to Stand:  (attempted x 2 with no clearance from bed)        Lateral/Scoot Transfers: Mod assist General transfer comment: pt with difficulty understanding how to use his arms to help push up to stand; finally fixed his hands on RW and tried to stand x2;  lateral scoot along bed to [facility] (using bed pad)  Ambulation/Gait                 Stairs            Wheelchair Mobility    Modified Rankin (Stroke Patients Only)       Balance   Sitting-balance support: No upper extremity supported;Feet unsupported Sitting balance-Leahy Scale: Good                              Cognition Arousal/Alertness: Awake/alert Behavior During Therapy: Restless Overall Cognitive Status: No family/caregiver present to determine baseline cognitive functioning       Memory: Decreased short-term memory              Exercises General Exercises - Lower Extremity Ankle Circles/Pumps: AAROM;Left;5 reps Long Arc Quad: AROM;Left;10 reps Heel Slides: AROM;Left;10 reps Other Exercises Other Exercises: seated perturbations and resisted trunk exercises (with and without UE support) Other Exercises: bridging in supine wiht LLE    General Comments        Pertinent Vitals/Pain Pain Assessment: No/denies pain    Home Living                      Prior Function            PT Goals (current goals can now be found in the care plan section) Acute Rehab PT Goals Patient Stated Goal: unable to state Time For Goal Achievement: 09/16/15 Progress towards PT goals: Progressing toward goals    Frequency  Min 2X/week    PT Plan Frequency needs to  be updated    Co-evaluation             End of Session Equipment Utilized During Treatment: Gait belt Activity Tolerance: Treatment limited secondary to medical complications (Comment) (bladder and bowel incontinence;AMS with difficulty fc) Patient left: in bed;with call bell/phone within reach;with bed alarm set;with nursing/sitter in room;with restraints reapplied (bil hand mitts)     Time: 1610-9604 PT Time Calculation (min) (ACUTE ONLY): 30 min  Charges:  $Therapeutic Exercise: 8-22 mins $Therapeutic Activity: 8-22 mins                    G Codes:       Abdulhadi Stopa 09-14-2015, 4:25 PM Pager [phone]

## 2015-09-04 NOTE — [place] FL2 (Signed)
[place] MEDICAID FL2 LEVEL OF CARE SCREENING TOOL     IDENTIFICATION  Patient Name: Billy [name] Birthdate: [birth year] Sex: male Admission Date (Current Location): 08/31/2015  [facility] and [place] Number:  Producer, television/film/video and Address:  [facility], [address], [place], [place] [address]      Provider Number: [id number]  Attending Physician Name and Address:  Leatha Gilding, MD  Relative Name and Phone Number:       Current Level of Care: Hospital Recommended Level of Care: Skilled Nursing Facility Prior Approval Number:    Date Approved/Denied:   PASRR Number: [id number]  Discharge Plan: SNF    Current Diagnoses: Patient Active Problem List   Diagnosis Date Noted  . Hypernatremia 09/02/2015  . BRBPR (bright red blood per rectum) 08/31/2015  . Acute encephalopathy 08/31/2015  . 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 DVT of right axillary vein, acute 05/25/2015  . Lower GI bleed 05/17/2015  . Rectal bleeding 11/27/2014  . Anemia 11/27/2014  . Alzheimer's disease 11/27/2014  . Diarrhea 11/23/2014  . Normocytic anemia 11/23/2014  . Protein-calorie malnutrition, severe (HCC) 11/23/2014  . GI bleed 11/21/2014  . Leukocytosis 11/21/2014  . HTN (hypertension) 11/21/2014  . Dementia 11/21/2014  . BPH (benign prostatic hyperplasia) 11/21/2014    Orientation RESPIRATION BLADDER Height & Weight     Self  Normal Incontinent Weight:   Height:     BEHAVIORAL SYMPTOMS/MOOD NEUROLOGICAL BOWEL NUTRITION STATUS      Incontinent Diet (nectar thick liquids)  AMBULATORY STATUS COMMUNICATION OF NEEDS Skin   Extensive Assist Verbally Surgical wounds                       Personal Care Assistance Level of Assistance  Bathing Bathing Assistance: Maximum assistance   Dressing Assistance: Maximum assistance     Functional Limitations Info             SPECIAL CARE FACTORS FREQUENCY  PT (By licensed PT), OT (By licensed OT)     PT  Frequency: 5/wk OT Frequency: 5/wk            Contractures      Additional Factors Info  Code Status, Allergies Code Status Info: DNR Allergies Info: NKA           Current Medications (09/04/2015):  This is the current hospital active medication list Current Facility-Administered Medications  Medication Dose Route Frequency Provider Last Rate Last Dose  . acetaminophen (TYLENOL) tablet 325-650 mg  325-650 mg Oral Q4H PRN Starleen Arms, MD   650 mg at 09/02/15 0145  . albuterol (PROVENTIL) (2.5 MG/3ML) 0.083% nebulizer solution 2.5 mg  2.5 mg Nebulization Q2H PRN Leana Roe Elgergawy, MD      . ciprofloxacin (CIPRO) IVPB 400 mg  400 mg Intravenous Q12H Starleen Arms, MD   400 mg at 09/04/15 0655  . dextrose 5 % solution   Intravenous Continuous Leatha Gilding, MD 75 mL/hr at 09/03/15 1942    . donepezil (ARICEPT) tablet 5 mg  5 mg Oral QHS Starleen Arms, MD   5 mg at 09/03/15 2142  . feeding supplement (ENSURE ENLIVE) (ENSURE ENLIVE) liquid 237 mL  237 mL Oral TID WC Leana Roe Elgergawy, MD   237 mL at 09/04/15 0800  . finasteride (PROSCAR) tablet 5 mg  5 mg Oral Daily Starleen Arms, MD   5 mg at 09/04/15 3086  . LORazepam (ATIVAN) injection 0.5 mg  0.5 mg Intravenous Q5 Min x 2 PRN Leatha Gilding, MD   0.5 mg at 09/01/15 1921  . metoprolol tartrate (LOPRESSOR) tablet 25 mg  25 mg Oral BID Leatha Gilding, MD   25 mg at 09/04/15 1478  . metroNIDAZOLE (FLAGYL) IVPB 500 mg  500 mg Intravenous Q8H Laura A Harduk, PA-C   500 mg at 09/04/15 0430  . mirtazapine (REMERON) tablet 7.5 mg  7.5 mg Oral QHS Leana Roe Elgergawy, MD   7.5 mg at 09/03/15 2142  . pantoprazole (PROTONIX) injection 40 mg  40 mg Intravenous Q12H Starleen Arms, MD   40 mg at 09/04/15 2956  . saccharomyces boulardii (FLORASTOR) capsule 250 mg  250 mg Oral TID Starleen Arms, MD   250 mg at 09/04/15 2130  . sodium chloride flush (NS) 0.9 % injection 3 mL  3 mL Intravenous Q12H Starleen Arms, MD   3 mL at 09/04/15 8657     Discharge Medications: Please see discharge summary for a list of discharge medications.  Relevant Imaging Results:  Relevant Lab Results:   Additional Information SS#: [id number]  Izora Ribas, [place]

## 2015-09-04 NOTE — Progress Notes (Signed)
Speech Language Pathology Treatment: Dysphagia  Patient Details Name: Billy [name] MRN: [id number] DOB: Oct 19, 1931 Today's Date: 09/04/2015 Time: 1478-2956 SLP Time Calculation (min) (ACUTE ONLY): 9 min  Assessment / Plan / Recommendation Clinical Impression  While pt remains confused, he appears to have improved automaticity of swallow today with good mastication of Dys 2 textures. He does still have immediate throat clearing with thin liquids trials. Recommend to advance diet to Dys 2, continuing nectar thick liquids. He will need full supervision due to altered mentation.   HPI HPI: 82 y.o. male, with a past medical history of dementia, diverticulosis, hypertension, right ischemic foot status post AKA, with admission late last year for lower GI bleed, diverticulosis and chronic ischemic colitis, presents with complaints of altered mental status, bright red blood per rectum, knees and caregiver at bedside giving history, patient has been more altered, with decreased appetite over last 24 hours. CT No acute intracranial pathology. CXR Mild cardiomegaly, without acute disease.      SLP Plan  Continue with current plan of care     Recommendations  Diet recommendations: Dysphagia 2 (fine chop);Nectar-thick liquid Liquids provided via: Cup;Straw Medication Administration: Crushed with puree Supervision: Patient able to self feed;Full supervision/cueing for compensatory strategies Compensations: Minimize environmental distractions;Slow rate;Small sips/bites Postural Changes and/or Swallow Maneuvers: Seated upright 90 degrees             Oral Care Recommendations: Oral care BID Follow up Recommendations: Skilled Nursing facility Plan: Continue with current plan of care     GO               [name], M.A. CCC-SLP [phone]  [name] 09/04/2015, 4:44 PM

## 2015-09-04 NOTE — Progress Notes (Signed)
Pt lying in bed in NAD w/ mitts on.   Hgb stable x 48hr.  Per RN, no bleeding noted.  Will sign off; please call if we can be of further assistance in this pt's care.  Florencia Reasons, M.D. Pager [phone] If no answer or after 5 PM call [phone]

## 2015-09-05 ENCOUNTER — Inpatient Hospital Stay (HOSPITAL_COMMUNITY): Payer: Medicare Other

## 2015-09-05 DIAGNOSIS — R7881 Bacteremia: Secondary | ICD-10-CM

## 2015-09-05 DIAGNOSIS — E43 Unspecified severe protein-calorie malnutrition: Secondary | ICD-10-CM

## 2015-09-05 DIAGNOSIS — N3000 Acute cystitis without hematuria: Secondary | ICD-10-CM

## 2015-09-05 DIAGNOSIS — D72829 Elevated white blood cell count, unspecified: Secondary | ICD-10-CM

## 2015-09-05 DIAGNOSIS — R52 Pain, unspecified: Secondary | ICD-10-CM

## 2015-09-05 DIAGNOSIS — N390 Urinary tract infection, site not specified: Secondary | ICD-10-CM | POA: Diagnosis present

## 2015-09-05 LAB — CULTURE, BLOOD (ROUTINE X 2): Culture: NO GROWTH

## 2015-09-05 LAB — CBC
HEMATOCRIT: 31.6 % — AB (ref 39.0–52.0)
Hemoglobin: 9.9 g/dL — ABNORMAL LOW (ref 13.0–17.0)
MCH: 26 pg (ref 26.0–34.0)
MCHC: 31.3 g/dL (ref 30.0–36.0)
MCV: 82.9 fL (ref 78.0–100.0)
PLATELETS: 275 10*3/uL (ref 150–400)
RBC: 3.81 MIL/uL — ABNORMAL LOW (ref 4.22–5.81)
RDW: 15.7 % — AB (ref 11.5–15.5)
WBC: 9 10*3/uL (ref 4.0–10.5)

## 2015-09-05 MED ORDER — CIPROFLOXACIN HCL 500 MG PO TABS: 500.0000 mg | ORAL_TABLET | Freq: Two times a day (BID) | ORAL | Status: DC

## 2015-09-05 MED ORDER — MESALAMINE 400 MG PO TBEC: 800.0000 mg | DELAYED_RELEASE_TABLET | Freq: Three times a day (TID) | ORAL | Status: DC

## 2015-09-05 MED ORDER — MESALAMINE 400 MG PO TBEC
800.0000 mg | DELAYED_RELEASE_TABLET | Freq: Three times a day (TID) | ORAL | Status: DC
Start: 2015-09-05 — End: 2015-09-10

## 2015-09-05 MED ORDER — CIPROFLOXACIN HCL 500 MG PO TABS: 500.0000 mg | ORAL_TABLET | Freq: Two times a day (BID) | ORAL | Status: AC

## 2015-09-05 NOTE — Progress Notes (Signed)
*  PRELIMINARY RESULTS* Vascular Ultrasound Right upper extremity venous duplex has been completed.  Preliminary findings: No evidence of DVT or superficial thrombosis.   Farrel Demark, RDMS, RVT  09/05/2015, 11:56 AM

## 2015-09-05 NOTE — Progress Notes (Addendum)
Received auth from [facility] Medicare: auth number # [id number]  Patient will discharge to [facility] Anticipated discharge date:2/14 Family notified:pt niece- Educational psychologist by [employer]- scheduled for 3:15pm  CSW signing off.  Merlyn Lot, LCSWA Clinical Social Worker [phone]

## 2015-09-05 NOTE — Progress Notes (Signed)
Speech Language Pathology Treatment: Dysphagia  Patient Details Name: Billy [name] MRN: [id number] DOB: 12-09-1931 Today's Date: 09/05/2015 Time: 1425-1440 SLP Time Calculation (min) (ACUTE ONLY): 15 min  Assessment / Plan / Recommendation Clinical Impression  Skilled treatment session focused on addressing dysphagia goals.  Patient demonstrated effective mastication, transit and oral clearance of Dys.2 textures and nectar-thick liquids without overt s/s/ of aspiration with Mod cues to utilize safe swallow strategies.  Advanced texture trials, Dys.3 textures resulted in a need for Max assist multimodal cues to attend to and manage oral residue.  As a result, recommend to continue with current orders and continue Dys.3 trials with SLP.     HPI HPI: 82 y.o. male, with a past medical history of dementia, diverticulosis, hypertension, right ischemic foot status post AKA, with admission late last year for lower GI bleed, diverticulosis and chronic ischemic colitis, presents with complaints of altered mental status, bright red blood per rectum, knees and caregiver at bedside giving history, patient has been more altered, with decreased appetite over last 24 hours. CT No acute intracranial pathology. CXR Mild cardiomegaly, without acute disease.      SLP Plan  Continue with current plan of care     Recommendations  Diet recommendations: Dysphagia 2 (fine chop);Nectar-thick liquid Liquids provided via: Cup;Straw Medication Administration: Crushed with puree Supervision: Patient able to self feed;Full supervision/cueing for compensatory strategies Compensations: Minimize environmental distractions;Slow rate;Small sips/bites Postural Changes and/or Swallow Maneuvers: Seated upright 90 degrees             Oral Care Recommendations: Oral care BID Follow up Recommendations: Skilled Nursing facility;24 hour supervision/assistance Plan: Continue with current plan of care     GO             Charlane Ferretti., CCC-SLP [phone]  Desteny Freeman 09/05/2015, 4:11 PM

## 2015-09-05 NOTE — Clinical Social Work Placement (Signed)
   CLINICAL SOCIAL WORK PLACEMENT  NOTE  Date:  09/05/2015  Patient Details  Name: Akira Adelsberger MRN: [id number] Date of Birth: 1931-11-20  Clinical Social Work is seeking post-discharge placement for this patient at the Skilled  Nursing Facility level of care (*CSW will initial, date and re-position this form in  chart as items are completed):  Yes   Patient/family provided with [facility] Clinical Social Work Department's list of facilities offering this level of care within the geographic area requested by the patient (or if unable, by the patient's family).  Yes   Patient/family informed of their freedom to choose among providers that offer the needed level of care, that participate in Medicare, Medicaid or managed care program needed by the patient, have an available bed and are willing to accept the patient.  Yes   Patient/family informed of [facility]'s ownership interest in [facility] and [facility], as well as of the fact that they are under no obligation to receive care at these facilities.  PASRR submitted to EDS on       PASRR number received on       Existing PASRR number confirmed on 09/04/15     FL2 transmitted to all facilities in geographic area requested by pt/family on 09/04/15     FL2 transmitted to all facilities within larger geographic area on       Patient informed that his/her managed care company has contracts with or will negotiate with certain facilities, including the following:        Yes   Patient/family informed of bed offers received.  Patient chooses bed at       Physician recommends and patient chooses bed at      Patient to be transferred to   on  .  Patient to be transferred to facility by       Patient family notified on   of transfer.  Name of family member notified:        PHYSICIAN Please sign FL2, Please sign DNR     Additional Comment:    _______________________________________________ Izora Ribas,  LCSW 09/05/2015, 8:44 AM

## 2015-09-05 NOTE — Clinical Social Work Note (Signed)
Clinical Social Work Assessment  Patient Details  Name: Billy [name] MRN: [id number] Date of Birth: October 27, 1931  Date of referral:  09/05/15               Reason for consult:  Facility Placement                Permission sought to share information with:  Family Supports, Oceanographer granted to share information::  No (pt DO)  Name::     Billy [name]  Agency::  [facility]  Relationship::  niece  Contact Information:     Housing/Transportation Living arrangements for the past 2 months:  Single Family Home Source of Information:  Other (Comment Required) (niece) Patient Interpreter Needed:  None Criminal Activity/Legal Involvement Pertinent to Current Situation/Hospitalization:  No - Comment as needed Significant Relationships:  Other Family Members Lives with:   (caregiver) Do you feel safe going back to the place where you live?  No Need for family participation in patient care:  Yes (Comment) (decision making)  Care giving concerns:  Pt lives at home with caregiver but caregiver unable to care for patient given current level of physical needs   Social Worker assessment / plan:  CSW spoke with pt niece, Ms. Billy [name], concerning PT recommendation for SNF. Ms. Billy [name] states pt has been placed before in rehab and understands process  Employment status:  Retired Database administrator PT Recommendations:  Skilled Nursing Facility Information / Referral to community resources:  Skilled Nursing Facility  Patient/Family's Response to care: Pt niece/POA is agreeable to patient placement for rehab with hopes of returning home  Patient/Family's Understanding of and Emotional Response to Diagnosis, Current Treatment, and Prognosis:  No questions or concerns.  Emotional Assessment Appearance:  Appears stated age Attitude/Demeanor/Rapport:  Unable to Assess Affect (typically observed):  Unable to Assess Orientation:   Fluctuating Orientation (Suspected and/or reported Sundowners) Alcohol / Substance use:  Not Applicable Psych involvement (Current and /or in the community):     Discharge Needs  Concerns to be addressed:    Readmission within the last 30 days:  No Current discharge risk:  Physical Impairment Barriers to Discharge:  Continued Medical Work up   Billy Ribas, LCSW 09/05/2015, 8:42 AM

## 2015-09-05 NOTE — Progress Notes (Signed)
Nsg Discharge Note  Admit Date:  08/31/2015 Discharge date: 09/05/2015   [facility] K[facility] to be D/C'd Nursing Home per MD order.  AVS completed.  Copy for chart, and copy for patient signed, and dated. Patient/caregiver able to verbalize understanding.  Discharge Medication:   Medication List    STOP taking these medications        rivaroxaban 20 MG Tabs tablet  Commonly known as:  XARELTO      TAKE these medications        acetaminophen 325 MG tablet  Commonly known as:  TYLENOL  Take 1-2 tablets (325-650 mg total) by mouth every 4 (four) hours as needed for mild pain or fever.     ACIDOPHILUS PROBIOTIC 10 MG Tabs  Take 10 mg by mouth 3 (three) times daily.     ciprofloxacin 500 MG tablet  Commonly known as:  CIPRO  Take 1 tablet (500 mg total) by mouth 2 (two) times daily. For 9 more days. Stop date 2/23     donepezil 5 MG tablet  Commonly known as:  ARICEPT  Take 5 mg by mouth at bedtime.     ENSURE NUTRITION SHAKE Liqd  Take 1 Can by mouth 3 (three) times daily with meals.     finasteride 5 MG tablet  Commonly known as:  PROSCAR  Take 5 mg by mouth daily.     mesalamine 400 MG EC tablet  Commonly known as:  ASACOL  Take 2 tablets (800 mg total) by mouth 3 (three) times daily.     metoprolol tartrate 25 MG tablet  Commonly known as:  LOPRESSOR  Take 1 tablet (25 mg total) by mouth 2 (two) times daily.     mirtazapine 7.5 MG tablet  Commonly known as:  REMERON  Take 7.5 mg by mouth at bedtime.     potassium chloride SA 20 MEQ tablet  Commonly known as:  K-DUR,KLOR-CON  Take 1 tablet (20 mEq total) by mouth daily.        Discharge Assessment: Filed Vitals:   09/05/15 1503 09/05/15 1544  BP: 108/64 111/65  Pulse: 86 87  Temp: 98.3 F (36.8 C) 98.2 F (36.8 C)  Resp: 16 16   Skin clean, dry and intact without evidence of skin break down, no evidence of skin tears noted. IV catheter discontinued intact. Site without signs and symptoms of  complications - no redness or edema noted at insertion site, patient denies c/o pain - only slight tenderness at site.  Dressing with slight pressure applied.  D/c Instructions-Education: Discharge instructions given to patient/family with verbalized understanding. D/c education completed with patient/family including follow up instructions, medication list, d/c activities limitations if indicated, with other d/c instructions as indicated by MD - patient able to verbalize understanding, all questions fully answered. Patient instructed to return to ED, call 911, or call MD for any changes in condition.  Patient escorted via EMS, report called into [facility].   Kern Reap, RN 09/05/2015 4:13 PM

## 2015-09-05 NOTE — Discharge Summary (Addendum)
Physician Discharge Summary  Billy [name] ZOX:[id number] DOB: 03-21-1932 DOA: 08/31/2015  PCP: Billy Housekeeper, MD  Admit date: 08/31/2015 Discharge date: 09/05/2015  Time spent: > 30 minutes  Recommendations for Outpatient Follow-up:  1. Follow up with PCP in 2-3 weeks 2. Complete 14 days antibiotics with Ciprofloxacin with 9 days remaining   Discharge Diagnoses:  Active Problems:   GI bleed   Leukocytosis   HTN (hypertension)   Protein-calorie malnutrition, severe (HCC)   DVT of right axillary vein, acute   BRBPR (bright red blood per rectum)   Acute encephalopathy   Hypernatremia   UTI (urinary tract infection)   E coli bacteremia  Discharge Condition: stable  Diet recommendation: dysphagia 2  History of present illness:  Billy [name] is a 82 y.o. male, with a past medical history of dementia, diverticulosis, hypertension, right ischemic foot status post AKA, with admission late last year for lower GI bleed, workup significant for polyps, diverticulosis and chronic ischemic colitis, presents with complaints of altered mental status, bright red blood per rectum, knees and caregiver at bedside giving history, patient has been more altered, with decreased appetite over last 24 hours, no focal deficits noticed, as well the report bright red blood per rectum over last 24 hours as well, patient is afebrile in ED, hemoglobin at baseline, with elevated creatinine, and leukocytosis, UA still pending, CT abdomen and pelvis pending, hospitalist called to admit.  Hospital Course:  Acute encephalopathy - Patient with baseline dementia, mental status, CT head with no acute finding, likely due to UTI / dehydration - he was empirically started on Ciprofloxacin and Metronidazole to cover UTI/Colitis.  - while hospitalized, bedside aide reported seizure-like activity on 2/10, lasting a few seconds, patient without loss of consciousness, EEG is negative for active seizures - due to  reported slurred speech patient underwent MRI 2/10 without acute findings - He is close to baseline, he did have somewhat of a delirium associated with inpatient stay which is starting to clear on discharge  Escherichia coli UTI - With 1 out of 2 bottles Escherichia coli bacteremia, patient was maintained on IV antibiotics for 5 days, his mental status has returned to baseline, he defervesced, he is afebrile, his white count is normal, he will be transitioned to by mouth ciprofloxacin per microbiology, and is to complete a total of 14 days of antibiotics with 9 days remaining. Bright red blood per rectum - Hemoglobin remained stable, recent colonoscopy significant for diverticulosis, polyps, and chronic colitis. - Gastroenterology is consulted, signed off 2/13, no need for further endoscopic studies - His bleeding resolved with discontinuation of his Xarelto - GI bleed likely colitis vs diverticular History of DVT in the right axillary vein - Repeat ultrasound of his lower extremity as well as his right upper extremity where he had the DVT was negative for blood clot, this seems to have been resolved, he was getting close to finishing his Xarelto, we'll discontinue now since no further evidence of DVT and especially in the setting of a GI bleed  Protein calorie malnutrition - severe Hypertension - Resumed his home medications  Hypernatremia - due to poor po intake - resolved, encourage fluids   Procedures: Vascular Ultrasound Right upper extremity venous duplex has been completed. Preliminary findings: No evidence of DVT or superficial thrombosis.  Bilateral lower extremity venous duplex completed.  Preliminary report: There is no DVT or SVT noted in the bilateral lower extremities. There is sluggish flow noted throughout the left lower extremity.   Consultations:  GI  Discharge Exam: Filed Vitals:   09/04/15 2221 09/04/15 2223 09/05/15 0517 09/05/15 0913  BP: 145/128 139/79  150/87 117/62  Pulse: 91 96 74 80  Temp: 98.8 F (37.1 C)  97.7 F (36.5 C) 97.5 F (36.4 C)  TempSrc: Oral  Oral Oral  Resp: SpO2: 100%  100% 99%   General: NAD Cardiovascular: RRR Respiratory: CTA biL  Discharge Instructions Activity:  As tolerated   Get Medicines reviewed and adjusted: Please take all your medications with you for your next visit with your Primary MD  Please request your Primary MD to go over all hospital tests and procedure/radiological results at the follow up, please ask your Primary MD to get all Hospital records sent to his/her office.  If you experience worsening of your admission symptoms, develop shortness of breath, life threatening emergency, suicidal or homicidal thoughts you must seek medical attention immediately by calling 911 or calling your MD immediately if symptoms less severe.  You must read complete instructions/literature along with all the possible adverse reactions/side effects for all the Medicines you take and that have been prescribed to you. Take any new Medicines after you have completely understood and accpet all the possible adverse reactions/side effects.   Do not drive when taking Pain medications.   Do not take more than prescribed Pain, Sleep and Anxiety Medications  Special Instructions: If you have smoked or chewed Tobacco in the last 2 yrs please stop smoking, stop any regular Alcohol and or any Recreational drug use.  Wear Seat belts while driving.  Please note  You were cared for by a hospitalist during your hospital stay. Once you are discharged, your primary care physician will handle any further medical issues. Please note that NO REFILLS for any discharge medications will be authorized once you are discharged, as it is imperative that you return to your primary care physician (or establish a relationship with a primary care physician if you do not have one) for your aftercare needs so that they can  reassess your need for medications and monitor your lab values.    Medication List    STOP taking these medications        rivaroxaban 20 MG Tabs tablet  Commonly known as:  XARELTO      TAKE these medications        acetaminophen 325 MG tablet  Commonly known as:  TYLENOL  Take 1-2 tablets (325-650 mg total) by mouth every 4 (four) hours as needed for mild pain or fever.     ACIDOPHILUS PROBIOTIC 10 MG Tabs  Take 10 mg by mouth 3 (three) times daily.     ciprofloxacin 500 MG tablet  Commonly known as:  CIPRO  Take 1 tablet (500 mg total) by mouth 2 (two) times daily. For 9 more days. Stop date 2/23     donepezil 5 MG tablet  Commonly known as:  ARICEPT  Take 5 mg by mouth at bedtime.     ENSURE NUTRITION SHAKE Liqd  Take 1 Can by mouth 3 (three) times daily with meals.     finasteride 5 MG tablet  Commonly known as:  PROSCAR  Take 5 mg by mouth daily.     mesalamine 400 MG EC tablet  Commonly known as:  ASACOL  Take 2 tablets (800 mg total) by mouth 3 (three) times daily.     metoprolol tartrate 25 MG tablet  Commonly known as:  LOPRESSOR  Take 1 tablet (  25 mg total) by mouth 2 (two) times daily.     mirtazapine 7.5 MG tablet  Commonly known as:  REMERON  Take 7.5 mg by mouth at bedtime.     potassium chloride SA 20 MEQ tablet  Commonly known as:  K-DUR,KLOR-CON  Take 1 tablet (20 mEq total) by mouth daily.         The results of significant diagnostics from this hospitalization (including imaging, microbiology, ancillary and laboratory) are listed below for reference.    Significant Diagnostic Studies: Ct Head Wo Contrast  08/31/2015  CLINICAL DATA:  Altered mental status EXAM: CT HEAD WITHOUT CONTRAST TECHNIQUE: Contiguous axial images were obtained from the base of the skull through the vertex without intravenous contrast. COMPARISON:  05/21/2015 FINDINGS: There is no evidence of mass effect, midline shift, or extra-axial fluid collections. There is no  evidence of a space-occupying lesion or intracranial hemorrhage. There is no evidence of a cortical-based area of acute infarction. There is generalized cerebral atrophy. There is periventricular white matter low attenuation likely secondary to microangiopathy. The ventricles and sulci are appropriate for the patient's age. The basal cisterns are patent. Visualized portions of the orbits are unremarkable. The mastoid sinuses are clear. There is an air-fluid level in the right maxillary sinus. The osseous structures are unremarkable. IMPRESSION: 1.  No acute intracranial pathology. 2. Chronic microvascular disease and cerebral atrophy. Electronically Signed   By: Elige Ko   On: 08/31/2015 11:22   Mr Brain Wo Contrast  09/01/2015  CLINICAL DATA:  Altered mental status.  Slurred speech. EXAM: MRI HEAD WITHOUT CONTRAST TECHNIQUE: Multiplanar, multiecho pulse sequences of the brain and surrounding structures were obtained without intravenous contrast. COMPARISON:  CT head without contrast 08/30/2014. FINDINGS: The study is severely degraded by patient motion. The diffusion-weighted images demonstrate no evidence for acute or subacute infarction. Moderate periventricular and subcortical T2 changes are present bilaterally. There is mild to moderate diffuse atrophy. Flow is present in the major intracranial arteries. A left lens replacement is present. The globes and orbits are otherwise intact. A fluid level is present in the right maxillary sinus. The remaining paranasal sinuses and bilateral mastoid air cells are clear. IMPRESSION: 1. No acute intracranial abnormality. 2. The study is severely degraded by patient motion. 3. Moderate periventricular and subcortical white matter disease bilaterally likely reflects the sequela of chronic microvascular ischemia. 4. Right maxillary sinusitis. Electronically Signed   By: Marin Roberts M.D.   On: 09/01/2015 20:52   Ct Abdomen Pelvis W Contrast  08/31/2015   CLINICAL DATA:  Diffuse abdomen pain and fever. EXAM: CT ABDOMEN AND PELVIS WITH CONTRAST TECHNIQUE: Multidetector CT imaging of the abdomen and pelvis was performed using the standard protocol following bolus administration of intravenous contrast. CONTRAST:  75mL OMNIPAQUE IOHEXOL 300 MG/ML  SOLN COMPARISON:  None. FINDINGS: There are multiple low-density lesions within the liver, largest measures 5.2 x 5.5 cm consistent with liver cysts. The liver is otherwise unremarkable. The gallbladder is normal. There is mild dilatation of common bile duct measuring 7 mm, likely age related. The spleen, pancreas are normal. There is a 3 cm simple cyst in the upper pole right kidney. There is a 1.2 cm cyst in the lower pole left kidney. There is no nephrolithiasis or hydronephrosis bilaterally. The adrenal glands are normal. There is atherosclerosis of the abdominal aorta without aneurysmal dilatation. There is no abdominal lymphadenopathy. There is no small bowel obstruction. There is diffuse bowel wall thickening with surrounding inflammation of the  sigmoid colon and rectum. The remainder of the colon is normal. Fluid-filled bladder is normal. Minimal free fluid is identified in the pelvis. Atelectasis of the posterior lung bases are noted. Emphysematous changes are noted. Degenerative joint changes of the spine are identified. IMPRESSION: Diffuse bowel wall thickening of the rectum and distal sigmoid colon with surrounding inflammation. The findings can be seen in colitis. Liver and kidney cysts. Electronically Signed   By: Sherian Rein M.D.   On: 08/31/2015 17:49   Dg Chest Port 1 View  08/31/2015  CLINICAL DATA:  Altered mental status.  Dementia.  Hypertension. EXAM: PORTABLE CHEST 1 VIEW COMPARISON:  11/21/2014 FINDINGS: Midline trachea. Patient rotated left. Mild cardiomegaly. No pleural effusion or pneumothorax. Clear lungs. IMPRESSION: Mild cardiomegaly, without acute disease. Electronically Signed   By: Jeronimo Greaves M.D.   On: 08/31/2015 10:32    Microbiology: Recent Results (from the past 240 hour(s))  Urine culture     Status: None   Collection Time: 08/31/15 10:55 AM  Result Value Ref Range Status   Specimen Description URINE, CLEAN CATCH  Final   Special Requests NONE  Final   Culture >=100,000 COLONIES/mL ESCHERICHIA COLI  Final   Report Status 09/02/2015 FINAL  Final   Organism ID, Bacteria ESCHERICHIA COLI  Final      Susceptibility   Escherichia coli - MIC*    AMPICILLIN >=32 RESISTANT Resistant     CEFAZOLIN <=4 SENSITIVE Sensitive     CEFTRIAXONE <=1 SENSITIVE Sensitive     CIPROFLOXACIN <=0.25 SENSITIVE Sensitive     GENTAMICIN >=16 RESISTANT Resistant     IMIPENEM <=0.25 SENSITIVE Sensitive     NITROFURANTOIN <=16 SENSITIVE Sensitive     TRIMETH/SULFA <=20 SENSITIVE Sensitive     AMPICILLIN/SULBACTAM 16 INTERMEDIATE Intermediate     PIP/TAZO <=4 SENSITIVE Sensitive     * >=100,000 COLONIES/mL ESCHERICHIA COLI  Culture, blood (Routine X 2) w Reflex to ID Panel     Status: None   Collection Time: 08/31/15  1:25 PM  Result Value Ref Range Status   Specimen Description BLOOD RIGHT ARM  Final   Special Requests BOTTLES DRAWN AEROBIC ONLY 5CC  Final   Culture  Setup Time   Final    GRAM NEGATIVE RODS AEROBIC BOTTLE ONLY CRITICAL RESULT CALLED TO, READ BACK BY AND VERIFIED WITH: L WHITTAKER,RN AT 1610 09/03/15 BY L BENFIELD    Culture ESCHERICHIA COLI  Final   Report Status 09/05/2015 FINAL  Final   Organism ID, Bacteria ESCHERICHIA COLI  Final      Susceptibility   Escherichia coli - MIC*    AMPICILLIN >=32 RESISTANT Resistant     CEFAZOLIN <=4 SENSITIVE Sensitive     CEFEPIME <=1 SENSITIVE Sensitive     CEFTAZIDIME <=1 SENSITIVE Sensitive     CEFTRIAXONE <=1 SENSITIVE Sensitive     CIPROFLOXACIN <=0.25 SENSITIVE Sensitive     GENTAMICIN >=16 RESISTANT Resistant     IMIPENEM <=0.25 SENSITIVE Sensitive     TRIMETH/SULFA <=20 SENSITIVE Sensitive      AMPICILLIN/SULBACTAM 16 INTERMEDIATE Intermediate     PIP/TAZO <=4 SENSITIVE Sensitive     * ESCHERICHIA COLI  Culture, blood (Routine X 2) w Reflex to ID Panel     Status: None (Preliminary result)   Collection Time: 08/31/15  1:25 PM  Result Value Ref Range Status   Specimen Description BLOOD LEFT HAND  Final   Special Requests BOTTLES DRAWN AEROBIC ONLY 5CC  Final   Culture NO GROWTH 4  DAYS  Final   Report Status PENDING  Incomplete    Labs: Basic Metabolic Panel:  Recent Labs Lab 08/31/15 1016 09/01/15 0047 09/02/15 0740 09/02/15 2327  NA 148* 147* 147* 144  K 4.8 4.0 3.4* 3.8  CL 113* 114* 115* 113*  CO2 25 22 22  20*  GLUCOSE 87 109* 118* 121*  BUN 22* 21* 15 19  CREATININE 1.44* 1.37* 1.10 1.22  CALCIUM 8.9 8.8* 8.5* 8.7*   Liver Function Tests:  Recent Labs Lab 09/01/15 0047 09/02/15 0740  AST 22 20  ALT 11* 13*  ALKPHOS 80 67  BILITOT 0.2* 0.4  PROT 6.3* 5.9*  ALBUMIN 2.3* 2.1*    Recent Labs Lab 08/31/15 1016  AMMONIA 25   CBC:  Recent Labs Lab 08/31/15 1016  09/02/15 0740 09/02/15 1641 09/02/15 2327 09/03/15 0749 09/04/15 0630 09/05/15 0910  WBC 25.0*  --  8.2  --  7.2  --  7.9 9.0  NEUTROABS 20.4*  --   --   --   --   --   --   --   HGB 10.2*  < > 9.4* 9.9* 10.2* 9.5* 10.1* 9.9*  HCT 31.8*  < > 30.7* 32.2* 33.5* 31.6* 31.5* 31.6*  MCV 86.4  --  85.3  --  84.6  --  83.8 82.9  PLT 407*  --  339  --  326  --  235 275  < > = values in this interval not displayed.   SignedPamella Pert  [facility] 09/05/2015, 12:43 PM

## 2015-09-10 ENCOUNTER — Emergency Department (HOSPITAL_COMMUNITY)
Admission: EM | Admit: 2015-09-10 | Discharge: 2015-09-10 | Disposition: A | Discharge status: Home / Self Care | Payer: Medicare Other | Attending: Emergency Medicine | Admitting: Emergency Medicine

## 2015-09-10 ENCOUNTER — Encounter (HOSPITAL_COMMUNITY): Payer: Self-pay | Admitting: Emergency Medicine

## 2015-09-10 DIAGNOSIS — Z8639 Personal history of other endocrine, nutritional and metabolic disease: Secondary | ICD-10-CM | POA: Insufficient documentation

## 2015-09-10 DIAGNOSIS — F918 Other conduct disorders: Secondary | ICD-10-CM | POA: Diagnosis present

## 2015-09-10 DIAGNOSIS — Z87891 Personal history of nicotine dependence: Secondary | ICD-10-CM | POA: Insufficient documentation

## 2015-09-10 DIAGNOSIS — N400 Enlarged prostate without lower urinary tract symptoms: Secondary | ICD-10-CM | POA: Diagnosis not present

## 2015-09-10 DIAGNOSIS — F0391 Unspecified dementia with behavioral disturbance: Secondary | ICD-10-CM

## 2015-09-10 DIAGNOSIS — Z79899 Other long term (current) drug therapy: Secondary | ICD-10-CM | POA: Insufficient documentation

## 2015-09-10 DIAGNOSIS — G309 Alzheimer's disease, unspecified: Secondary | ICD-10-CM | POA: Diagnosis not present

## 2015-09-10 DIAGNOSIS — I10 Essential (primary) hypertension: Secondary | ICD-10-CM | POA: Diagnosis not present

## 2015-09-10 DIAGNOSIS — F0281 Dementia in other diseases classified elsewhere with behavioral disturbance: Secondary | ICD-10-CM | POA: Diagnosis not present

## 2015-09-10 MED ORDER — CIPROFLOXACIN HCL 500 MG PO TABS
500.0000 mg | ORAL_TABLET | Freq: Two times a day (BID) | ORAL | Status: DC
  Administered 2015-09-10: 500 mg via ORAL
  Filled 2015-09-10: qty 1

## 2015-09-10 MED ORDER — ACETAMINOPHEN 325 MG PO TABS: 325.0000 mg | ORAL_TABLET | ORAL | Status: DC | PRN

## 2015-09-10 MED ORDER — MIRTAZAPINE 7.5 MG PO TABS
7.5000 mg | ORAL_TABLET | Freq: Every day | ORAL | Status: DC
  Filled 2015-09-10: qty 1

## 2015-09-10 MED ORDER — LORAZEPAM 1 MG PO TABS
1.0000 mg | ORAL_TABLET | Freq: Four times a day (QID) | ORAL | Status: DC | PRN
  Administered 2015-09-10: 1 mg via ORAL
  Filled 2015-09-10: qty 1

## 2015-09-10 MED ORDER — METOPROLOL TARTRATE 25 MG PO TABS: 25.0000 mg | ORAL_TABLET | Freq: Two times a day (BID) | ORAL | Status: DC

## 2015-09-10 MED ORDER — MESALAMINE 400 MG PO CPDR
800.0000 mg | DELAYED_RELEASE_CAPSULE | Freq: Three times a day (TID) | ORAL | Status: DC
  Filled 2015-09-10 (×3): qty 2

## 2015-09-10 MED ORDER — FINASTERIDE 5 MG PO TABS
5.0000 mg | ORAL_TABLET | Freq: Every day | ORAL | Status: DC
  Filled 2015-09-10: qty 1

## 2015-09-10 MED ORDER — DONEPEZIL HCL 5 MG PO TABS: 5.0000 mg | ORAL_TABLET | Freq: Every day | ORAL | Status: DC

## 2015-09-10 NOTE — Clinical Social Work Note (Signed)
CSW called [facility] for pt transport to [facility]  .Elray Buba, LCSW [facility] Clinical Social Worker - Weekend Coverage cell #: [phone]

## 2015-09-10 NOTE — Discharge Instructions (Signed)
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
## 2015-09-10 NOTE — Clinical Social Work Note (Signed)
CSW spoke with pt's POA/Jean [name] to provide information regarding how to apply for medicaid.  Family is interested in long term care for pt after he completes rehab  .Elray Buba, LCSW [facility] Clinical Social Worker - Weekend Coverage cell #: [phone]
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
## 2015-09-10 NOTE — ED Provider Notes (Signed)
Pt accepted back to [facility]   Zadie Rhine, MD 09/10/15 1040
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
## 2015-10-21 DEATH — deceased

## 2015-12-11 NOTE — Progress Notes (Addendum)
DATE: 06/26/15  Location:  [facility]    Place of Service: SNF (31)   Extended Emergency Contact Information Primary Emergency Contact: Adelene Idler [place] Home Phone: [phone] Mobile Phone: [phone] Relation: Niece Secondary Emergency Contact: Reather Converse [place] Home Phone: [phone] Mobile Phone: [phone] Relation: Other  Advanced Directive information Does patient have an advance directive?: Yes, Type of Advance Directive: Out of facility DNR (pink MOST or yellow form)  Chief Complaint  Patient presents with  . Discharge Note    HPI:  82 yo male seen today for discharge from SNF following short term rehab. He has no concerns. He will be going home. He will need HH RN, PT/OT and DME (Standard w/c, walker with wheels, bedside commode). He is a poor historian due to dementia. Hx obtained form chart  BRIEF SUMMARY:  Sent to SNF following hospital stay for right AKA due to ischemic foot, RUE DVT, severe protein calorie malnutrition and dementia. He presented with rectal bleeding and colonoscopy revealed colitis s/o IBD. He was started on mesalamine and prednisone. During admission, RLE ischemia noted and she underwent right AKA on 11/4th by vascular sx. He also was dx with acute RUE DVT which was tx with IV heparin --> xeralto. EEG performed and was abnormal (moderately severe generalized continuous nonspecific slowing of cerebral activity consistent with moderately severe encephalopathic state; no frank sz activity noted). 2D echo showed EF 40-45% with grade 1 diastolic dysfunction, mild MR and mild RA dilation, small pericardial effusion.   HTN - stable on lopressor. He takes K+ supplement  Dementia -stable on aricept. Takes remeron for appetite stimulation. He gets nutritional supplements for protein calorie malnutrition  BPH - stable on proscar  Hyperlipidemia - diet controlled  RUE DVT - stable on  xeralto  Colitis - completed cipro/doxy. Currently takes asacol, prednisone and anusol hc. No rectal bleeding  Right AKA - stable. Pain controlled. Wound was followed by wound care. Followed by ortho  Past Medical History  Diagnosis Date  . Hypertension   . Hyperlipidemia   . Alzheimer's disease   . HTN (hypertension) 11/21/2014  . Dementia 11/21/2014  . BPH (benign prostatic hyperplasia) 11/21/2014    Past Surgical History  Procedure Laterality Date  . Colonoscopy N/A 05/19/2015    Procedure: COLONOSCOPY;  Surgeon: Vida Rigger, MD;  Location: [facility] ENDOSCOPY;  Service: Endoscopy;  Laterality: N/A;  . Amputation Right 05/26/2015    Procedure: RIGHT ABOVE THE KNEE  AMPUTATION ;  Surgeon: Chuck Hint, MD;  Location: [facility] OR;  Service: Vascular;  Laterality: Right;    Patient Care Team: [name], MD as PCP - General (Internal Medicine)  Social History   Social History  . Marital Status: Married    Spouse Name: N/A  . Number of Children: N/A  . Years of Education: N/A   Occupational History  . Not on file.   Social History Main Topics  . Smoking status: Former Smoker    Quit date: 06/29/2005  . Smokeless tobacco: Never Used  . Alcohol Use: No  . Drug Use: No  . Sexual Activity: Not on file   Other Topics Concern  . Not on file   Social History Narrative     reports that he quit smoking about 10 years ago. He has never used smokeless tobacco. He reports that he does not drink alcohol or use illicit drugs.   There is no immunization history on file for this  patient.  No Known Allergies  Medications: Patient's Medications  New Prescriptions   LACTOBACILLUS (ACIDOPHILUS PROBIOTIC) 10 MG TABS    Take 10 mg by mouth 3 (three) times daily.  Previous Medications   ACETAMINOPHEN (TYLENOL) 325 MG TABLET    Take 1-2 tablets (325-650 mg total) by mouth every 4 (four) hours as needed for mild pain or fever.   DONEPEZIL (ARICEPT) 5 MG TABLET    Take 5 mg by mouth at  bedtime.    FINASTERIDE (PROSCAR) 5 MG TABLET    Take 5 mg by mouth daily.    MESALAMINE 800 MG TBEC    Take 800 mg by mouth 3 (three) times daily.   METOPROLOL TARTRATE (LOPRESSOR) 25 MG TABLET    Take 1 tablet (25 mg total) by mouth 2 (two) times daily.   MIRTAZAPINE (REMERON) 7.5 MG TABLET    Take 7.5 mg by mouth at bedtime.    NUTRITIONAL SUPPLEMENTS (ENSURE NUTRITION SHAKE) LIQD    Take 1 Can by mouth 3 (three) times daily with meals.   POTASSIUM CHLORIDE SA (K-DUR,KLOR-CON) 20 MEQ TABLET    Take 1 tablet (20 mEq total) by mouth daily.  Modified Medications   Modified Medication Previous Medication   CIPROFLOXACIN (CIPRO) 500 MG TABLET ciprofloxacin (CIPRO) 500 MG tablet      Take 1 tablet (500 mg total) by mouth 2 (two) times daily. For 9 more days. Stop date 2/23    Take 1 tablet (500 mg total) by mouth 2 (two) times daily. For 9 more days. Stop date 2/23  Discontinued Medications   DOCUSATE SODIUM (COLACE) 100 MG CAPSULE    Take 1 capsule (100 mg total) by mouth 2 (two) times daily.   DOXYCYCLINE (VIBRA-TABS) 100 MG TABLET    Take 1 tablet (100 mg total) by mouth every 12 (twelve) hours. For 10 more days.   HYDROCORTISONE (ANUSOL-HC) 25 MG SUPPOSITORY    Place 1 suppository (25 mg total) rectally 2 (two) times daily.   MESALAMINE (ASACOL) 400 MG CPDR DR CAPSULE    Take 2 capsules (800 mg total) by mouth 3 (three) times daily.   OXYCODONE-ACETAMINOPHEN (PERCOCET/ROXICET) 5-325 MG TABLET    Take 1-2 tablets by mouth every 4 (four) hours as needed for moderate pain.   PREDNISONE (DELTASONE) 20 MG TABLET    Take 1 tablet (20 mg total) by mouth daily with breakfast.   RIVAROXABAN (XARELTO STARTER PACK) 15 & 20 MG TBPK    Take as directed on package: Start with one  tablet by mouth twice a day with food. On Day 22, switch to one  tablet once a day with food.   RIVAROXABAN (XARELTO) 20 MG TABS TABLET    Take 1 tablet (20 mg total) by mouth daily with supper. PLEASE START ONLY AFTER ALL  THE TABLETS IN THE STARTER PACK HAVE BEEN TAKEN.    Review of Systems  Unable to perform ROS: Dementia    Filed Vitals:   06/26/15 1354  BP: 114/62  Pulse: 58  Temp: 98.4 F (36.9 C)  TempSrc: Oral  Resp: 20  Height:  (1.753 m)  Weight: 105 lb (47.628 kg)   Body mass index is 15.5 kg/(m^2).  Physical Exam  Constitutional: He appears well-developed.  Neurological: He is alert.  Skin: Skin is warm and dry. No rash noted.  Psychiatric: He has a normal mood and affect. His behavior is normal.     Labs reviewed: Admission on 05/17/2015, Discharged on 05/30/2015  No results displayed because visit has over 200 results.      No results found.   Assessment/Plan   ICD-9-CM ICD-10-CM   1. Status post above knee amputation of right lower extremity (HCC) V49.76 Z89.611   2. Protein-calorie malnutrition, severe (HCC) 262 E43   3. Colitis, acute - resolving 558.9 K52.9   4. DVT of right axillary vein, acute 453.84 I80.8   5. Essential hypertension 401.9 I10   6. BPH (benign prostatic hyperplasia) 600.00 N40.0   7. Alzheimer's dementia without behavioral disturbance, unspecified timing of dementia onset 331.0 G30.9    294.10 F02.80     Patient is being discharged with home health services:  RN, PT/OT  Patient is being discharged with the following durable medical equipment:  Standard w/c, walker with wheels, bedside commode   Patient has been advised to f/u with their PCP in 1-2 weeks to bring them up to date on their rehab stay.  They were provided with a 30 day supply of scripts for prescription medications and refills must be obtained from their PCP.  TIME SPENT (MINUTES): 35  Tradarius Reinwald S. Ancil Linseyarter, D. O., F. A. C. O. I.  [facility] [address] [place], [place] [address] [phone] Cell (Monday-Friday 8 AM - 5 PM) [phone] After 5 PM and follow prompts

## 2016-02-21 IMAGING — NM NM GI BLOOD LOSS
2 series · 12 of 12 positions shown · non-contrast
Comparison: None.

CLINICAL DATA: PT exam due to bloody stool, possible GI bleedPT's
nurse explained to tech (AA) that has been incontinent and had
passed stool early 11/28/14 with minor flecks of blood.PT Hx of HTN,
HLD, Alzheimer's Disease, Dementia, BPH

EXAM:
NUCLEAR MEDICINE GASTROINTESTINAL BLEEDING SCAN
TECHNIQUE: Sequential abdominal images were obtained following intravenous
administration of 3c-BBm labeled red blood cells.
RADIOPHARMACEUTICALS:  30 mCi 3c-BBm in-vitro labeled red cells.

[gi gi bleed · 5.01mm/px · 6 of 24 frames shown (1 of 2)]
[frame 3/24]
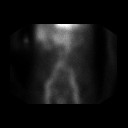
[frame 7/24]
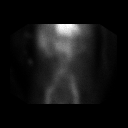
[frame 11/24]
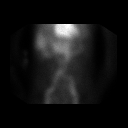
[frame 15/24]
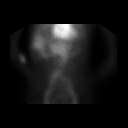
[frame 19/24]
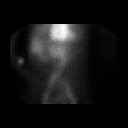
[frame 23/24]
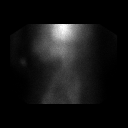

[gi gi bleed · 5.01mm/px · 6 of 35 frames shown (2 of 2)]
[frame 3/35]
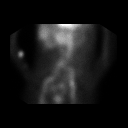
[frame 9/35]
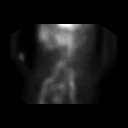
[frame 15/35]
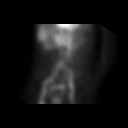
[frame 21/35]
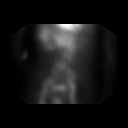
[frame 27/35]
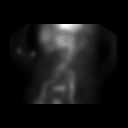
[frame 33/35]
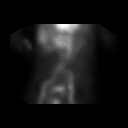

[12 of 12 positions shown; findings below may reference images not displayed]

FINDINGS: Study degraded by patient motion. Allowing for this, there is no
evidence of a GI bleeding source. Normal uptake is seen in the
liver.
IMPRESSION: No evidence of a GI bleeding source.

## 2016-06-26 ENCOUNTER — Encounter: Payer: Self-pay | Admitting: Vascular Surgery

## 2016-07-03 ENCOUNTER — Encounter (HOSPITAL_COMMUNITY): Payer: Medicare Other

## 2016-07-03 ENCOUNTER — Ambulatory Visit: Payer: Medicare Other | Admitting: Vascular Surgery

## 2016-11-24 IMAGING — MR MR HEAD W/O CM
10 of 11 series · 42 of 48 positions shown · non-contrast
Comparison: CT head without contrast 08/30/2014.

CLINICAL DATA: Altered mental status.  Slurred speech.

EXAM:
MRI HEAD WITHOUT CONTRAST
TECHNIQUE: Multiplanar, multiecho pulse sequences of the brain and surrounding
structures were obtained without intravenous contrast.

[Series 4: DWI · axial · 3.0mm · 1.09mm/px · z∈[-42,+96]mm · 8 of 94 slices shown (1 of 5)]
[im 1/94]
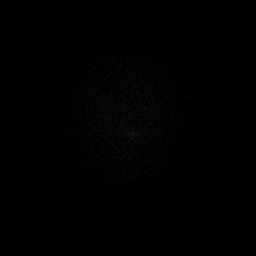
[im 11/94]
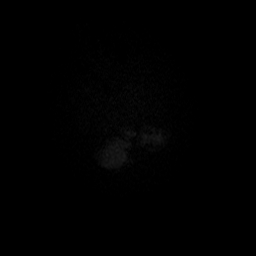
[im 32/94]
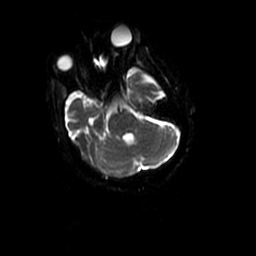
[im 42/94]
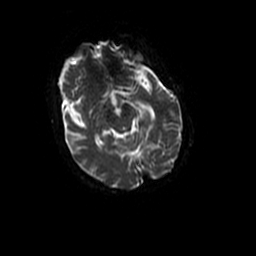
[im 52/94]
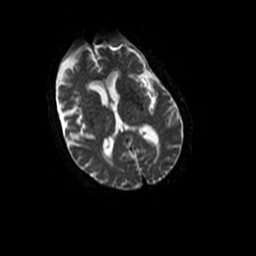
[im 63/94]
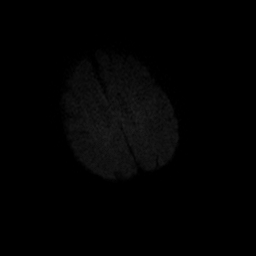
[im 83/94]
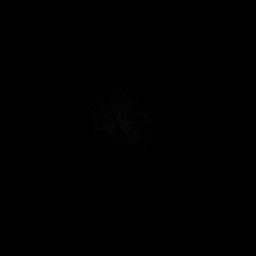
[im 94/94]
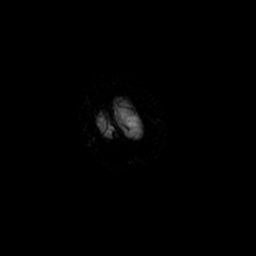

[Series 6: T2 · axial · 5.0mm · 0.43mm/px · z∈[-55,+82]mm · 2 of 24 slices shown (1 of 2)]
[im 1/24]
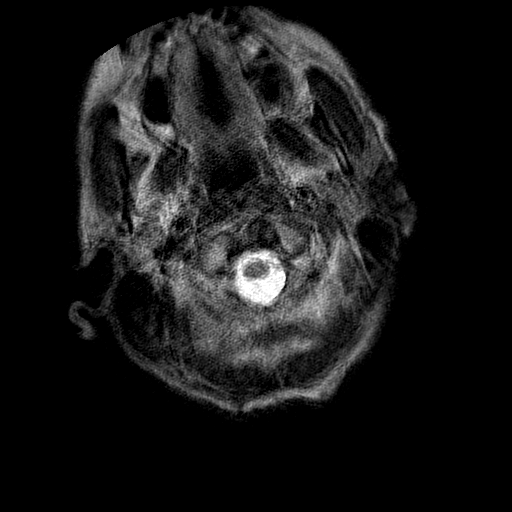
[im 24/24]
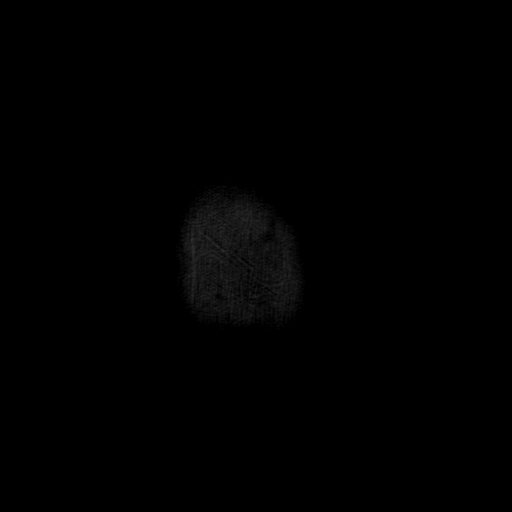

[Series 7: FLAIR · axial · 5.0mm · 0.43mm/px · z∈[-55,+82]mm · 3 of 24 slices shown (1 of 3)]
[im 1/24]
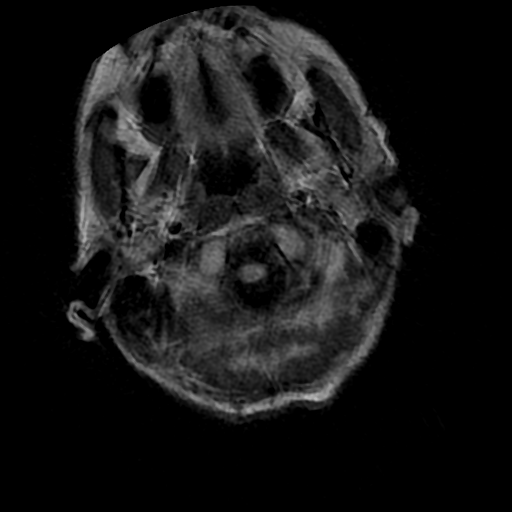
[im 12/24]
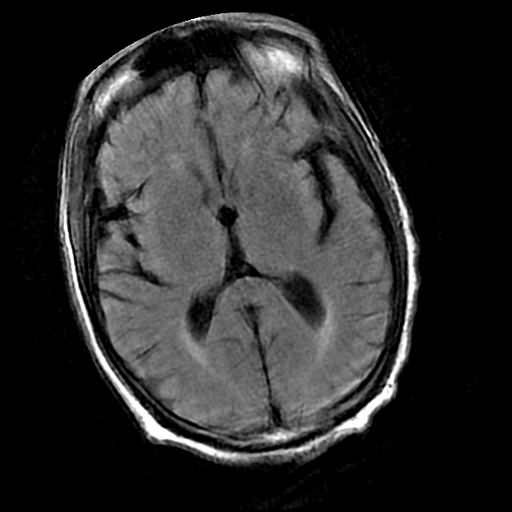
[im 24/24]
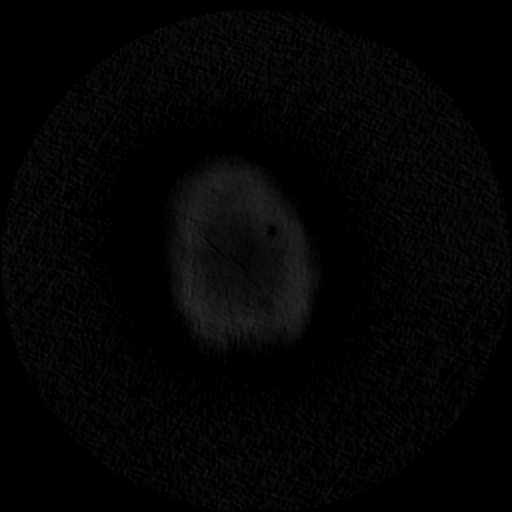

[Series 9: DWI · coronal · 5.0mm · 1.09mm/px · 8 of 66 slices shown (2 of 5)]
[im 1/66]
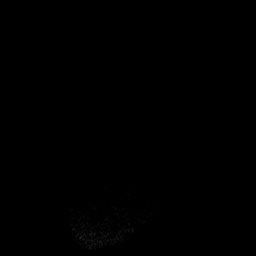
[im 10/66]
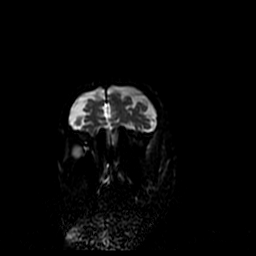
[im 19/66]
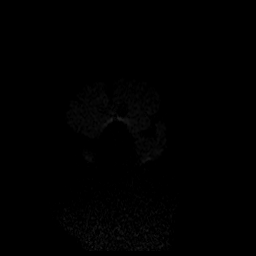
[im 28/66]
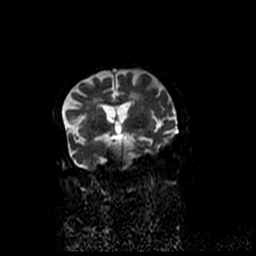
[im 38/66]
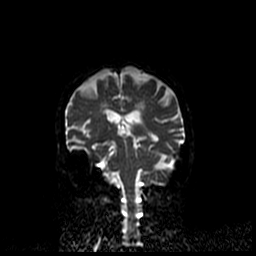
[im 47/66]
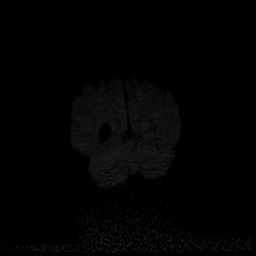
[im 56/66]
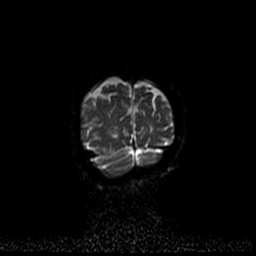
[im 66/66]
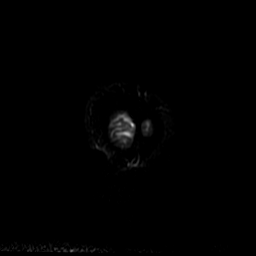

[Series 10: T2 · coronal · 5.0mm · 0.43mm/px · 2 of 25 slices shown (2 of 2)]
[im 1/25]
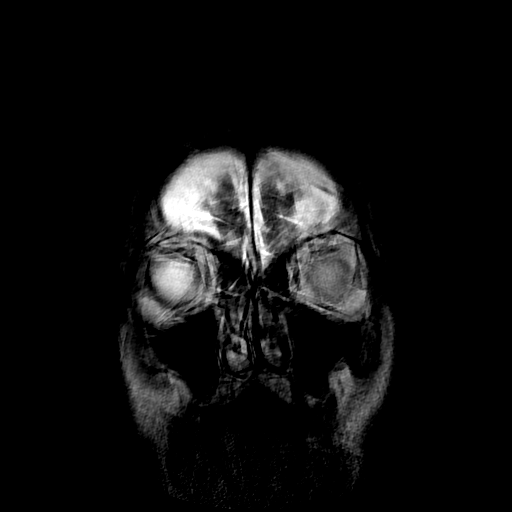
[im 13/25]
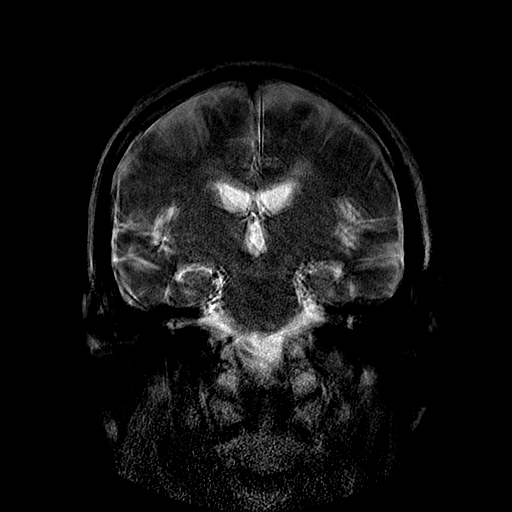

[Series 11: FLAIR · axial · 5.0mm · 0.86mm/px · z∈[-55,+82]mm · 3 of 24 slices shown (2 of 3)]
[im 1/24]
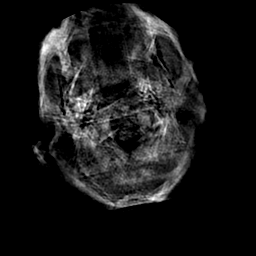
[im 12/24]
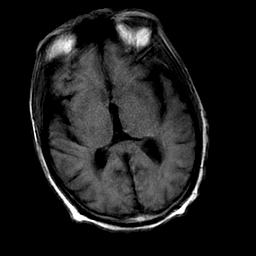
[im 24/24]
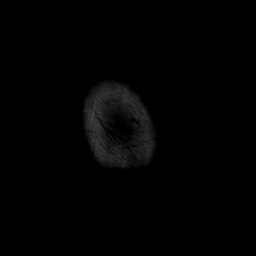

[Series 12: FLAIR · sagittal · 5.0mm · 0.47mm/px · 2 of 15 slices shown (3 of 3)]
[im 1/15]
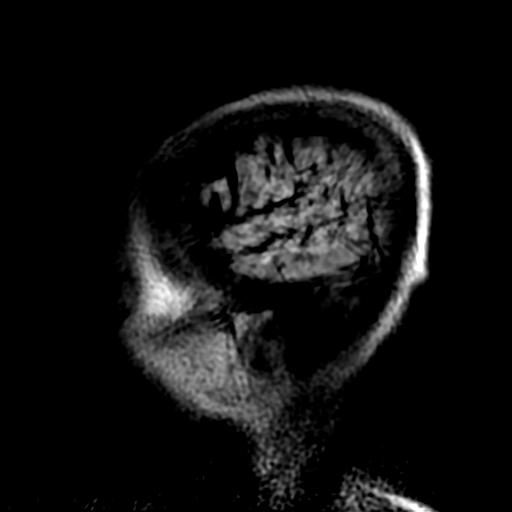
[im 15/15]
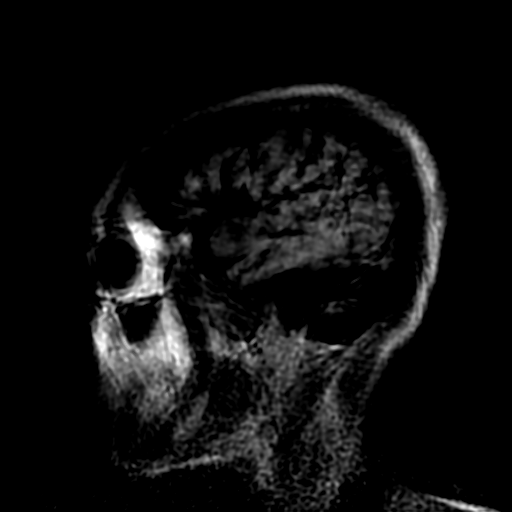

[Series 400: DWI · axial · 3.0mm · 1.09mm/px · z∈[-42,+96]mm · 6 of 47 slices shown (3 of 5)]
[im 1/47]
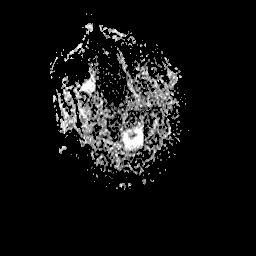
[im 10/47]
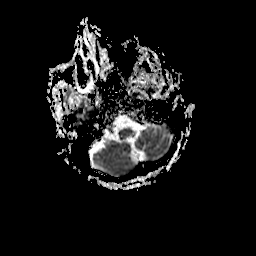
[im 19/47]
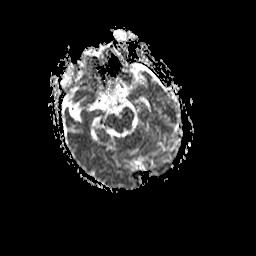
[im 28/47]
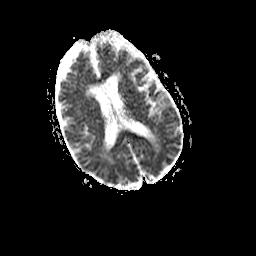
[im 37/47]
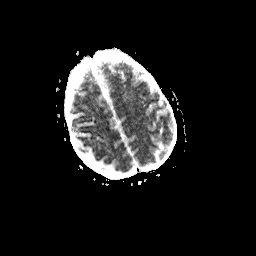
[im 47/47]
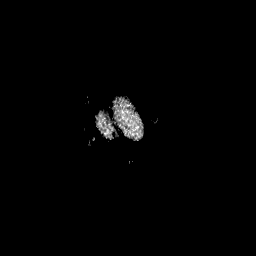

[Series 500: DWI · coronal · 5.0mm · 1.09mm/px · 4 of 33 slices shown (4 of 5)]
[im 1/33]
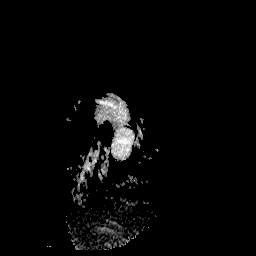
[im 11/33]
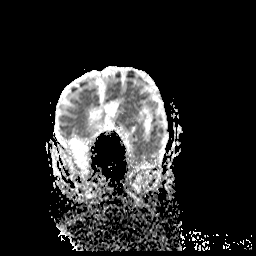
[im 22/33]
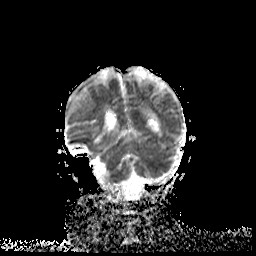
[im 33/33]
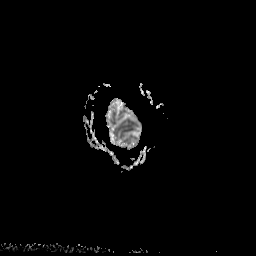

[Series 900: DWI · coronal · 5.0mm · 1.09mm/px · 4 of 33 slices shown (5 of 5)]
[im 1/33]
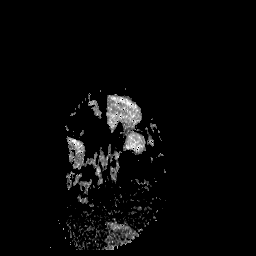
[im 11/33]
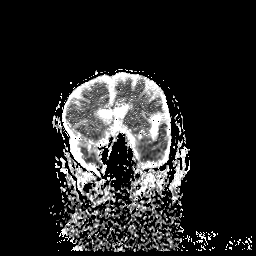
[im 22/33]
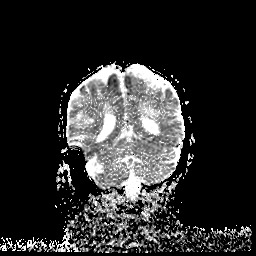
[im 33/33]
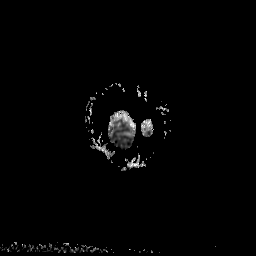

[42 of 48 positions shown; findings below may reference images not displayed]

FINDINGS: The study is severely degraded by patient motion. The
diffusion-weighted images demonstrate no evidence for acute or
subacute infarction.

Moderate periventricular and subcortical T2 changes are present
bilaterally. There is mild to moderate diffuse atrophy.

Flow is present in the major intracranial arteries. A left lens
replacement is present. The globes and orbits are otherwise intact.
A fluid level is present in the right maxillary sinus. The remaining
paranasal sinuses and bilateral mastoid air cells are clear.
IMPRESSION: 1. No acute intracranial abnormality.
2. The study is severely degraded by patient motion.
3. Moderate periventricular and subcortical white matter disease
bilaterally likely reflects the sequela of chronic microvascular
ischemia.
4. Right maxillary sinusitis.
